# Patient Record
Sex: Male | Born: 1940 | Race: Black or African American | Hispanic: No | Marital: Married | Smoking: Never smoker
Health system: Southern US, Community
[De-identification: ages and names within clinical notes are randomized; demographics above are authoritative.]

## PROBLEM LIST (undated history)

## (undated) DIAGNOSIS — I10 Essential (primary) hypertension: Secondary | ICD-10-CM

## (undated) DIAGNOSIS — C801 Malignant (primary) neoplasm, unspecified: Secondary | ICD-10-CM

## (undated) DIAGNOSIS — M1990 Unspecified osteoarthritis, unspecified site: Secondary | ICD-10-CM

## (undated) DIAGNOSIS — C189 Malignant neoplasm of colon, unspecified: Secondary | ICD-10-CM

## (undated) DIAGNOSIS — T300 Burn of unspecified body region, unspecified degree: Secondary | ICD-10-CM

## (undated) DIAGNOSIS — I219 Acute myocardial infarction, unspecified: Secondary | ICD-10-CM

## (undated) DIAGNOSIS — Z950 Presence of cardiac pacemaker: Secondary | ICD-10-CM

## (undated) DIAGNOSIS — K639 Disease of intestine, unspecified: Secondary | ICD-10-CM

## (undated) DIAGNOSIS — N189 Chronic kidney disease, unspecified: Secondary | ICD-10-CM

## (undated) HISTORY — DX: Disease of intestine, unspecified: K63.9

## (undated) HISTORY — DX: Essential (primary) hypertension: I10

## (undated) HISTORY — PX: OTHER SURGICAL HISTORY: SHX169

## (undated) HISTORY — DX: Malignant neoplasm of colon, unspecified: C18.9

## (undated) HISTORY — PX: ROTATOR CUFF REPAIR: SHX139

## (undated) HISTORY — PX: CYSTOSCOPY: SUR368

## (undated) HISTORY — PX: CERVICAL SPINE SURGERY: SHX589

## (undated) HISTORY — PX: HERNIA REPAIR: SHX51

## (undated) HISTORY — DX: Acute myocardial infarction, unspecified: I21.9

## (undated) HISTORY — DX: Unspecified osteoarthritis, unspecified site: M19.90

---

## 1998-04-17 ENCOUNTER — Emergency Department (HOSPITAL_COMMUNITY): Admission: EM | Admit: 1998-04-17 | Discharge: 1998-04-17 | Payer: Self-pay | Admitting: Emergency Medicine

## 2002-06-21 ENCOUNTER — Ambulatory Visit (HOSPITAL_BASED_OUTPATIENT_CLINIC_OR_DEPARTMENT_OTHER): Admission: RE | Admit: 2002-06-21 | Discharge: 2002-06-21 | Payer: Self-pay | Admitting: Orthopedic Surgery

## 2003-01-27 ENCOUNTER — Emergency Department (HOSPITAL_COMMUNITY): Admission: AC | Admit: 2003-01-27 | Discharge: 2003-01-27 | Payer: Self-pay

## 2003-01-27 ENCOUNTER — Encounter: Payer: Self-pay | Admitting: Emergency Medicine

## 2004-12-19 ENCOUNTER — Ambulatory Visit: Payer: Self-pay | Admitting: Gastroenterology

## 2005-01-07 ENCOUNTER — Ambulatory Visit (HOSPITAL_COMMUNITY): Admission: RE | Admit: 2005-01-07 | Discharge: 2005-01-07 | Payer: Self-pay | Admitting: General Surgery

## 2005-01-07 ENCOUNTER — Ambulatory Visit (HOSPITAL_BASED_OUTPATIENT_CLINIC_OR_DEPARTMENT_OTHER): Admission: RE | Admit: 2005-01-07 | Discharge: 2005-01-07 | Payer: Self-pay | Admitting: General Surgery

## 2005-07-29 HISTORY — PX: COLONOSCOPY: SHX174

## 2005-11-04 ENCOUNTER — Emergency Department (HOSPITAL_COMMUNITY): Admission: EM | Admit: 2005-11-04 | Discharge: 2005-11-04 | Payer: Self-pay | Admitting: Emergency Medicine

## 2006-03-11 ENCOUNTER — Ambulatory Visit: Payer: Self-pay | Admitting: Gastroenterology

## 2006-03-21 ENCOUNTER — Ambulatory Visit: Payer: Self-pay | Admitting: Gastroenterology

## 2008-08-19 ENCOUNTER — Encounter: Admission: RE | Admit: 2008-08-19 | Discharge: 2008-08-19 | Payer: Self-pay | Admitting: Neurosurgery

## 2008-09-02 ENCOUNTER — Ambulatory Visit (HOSPITAL_COMMUNITY): Admission: RE | Admit: 2008-09-02 | Discharge: 2008-09-02 | Payer: Self-pay | Admitting: Neurosurgery

## 2008-09-27 ENCOUNTER — Inpatient Hospital Stay (HOSPITAL_COMMUNITY): Admission: RE | Admit: 2008-09-27 | Discharge: 2008-09-29 | Payer: Self-pay | Admitting: Neurosurgery

## 2009-08-03 ENCOUNTER — Ambulatory Visit (HOSPITAL_COMMUNITY): Admission: RE | Admit: 2009-08-03 | Discharge: 2009-08-03 | Payer: Self-pay | Admitting: General Surgery

## 2010-10-14 LAB — BASIC METABOLIC PANEL
BUN: 14 mg/dL (ref 6–23)
Calcium: 9.2 mg/dL (ref 8.4–10.5)
Creatinine, Ser: 0.8 mg/dL (ref 0.4–1.5)
GFR calc non Af Amer: >60 mL/min (ref >60)
Glucose, Bld: 76 mg/dL (ref 70–99)
Potassium: 4.5 mEq/L (ref 3.5–5.1)

## 2010-10-14 LAB — DIFFERENTIAL
Basophils Absolute: 0 10*3/uL (ref 0.0–0.1)
Eosinophils Absolute: 0.1 10*3/uL (ref 0.0–0.7)
Eosinophils Relative: 2 % (ref 0–5)
Lymphocytes Relative: 32 % (ref 12–46)
Neutrophils Relative %: 54 % (ref 43–77)

## 2010-10-14 LAB — CBC
HCT: 41.9 % (ref 39.0–52.0)
Platelets: 224 10*3/uL (ref 150–400)
RDW: 12.7 % (ref 11.5–15.5)
WBC: 4.7 10*3/uL (ref 4.0–10.5)

## 2010-10-14 LAB — PROTIME-INR
INR: 1.06 (ref 0.00–1.49)
Prothrombin Time: 13.7 seconds (ref 11.6–15.2)

## 2010-11-13 LAB — URINALYSIS, ROUTINE W REFLEX MICROSCOPIC
Bilirubin Urine: NEGATIVE
Hgb urine dipstick: NEGATIVE
Ketones, ur: NEGATIVE mg/dL
Ketones, ur: NEGATIVE mg/dL
Nitrite: NEGATIVE
Protein, ur: NEGATIVE mg/dL
Protein, ur: NEGATIVE mg/dL
Urobilinogen, UA: 1 mg/dL (ref 0.0–1.0)
pH: 5.5 (ref 5.0–8.0)

## 2010-11-13 LAB — CBC
MCHC: 35 g/dL (ref 30.0–36.0)
MCHC: 35.1 g/dL (ref 30.0–36.0)
MCV: 96.1 fL (ref 78.0–100.0)
Platelets: 167 10*3/uL (ref 150–400)
RDW: 13.1 % (ref 11.5–15.5)
RDW: 12.8 % (ref 11.5–15.5)

## 2010-11-13 LAB — PROTIME-INR
INR: 1 (ref 0.00–1.49)
Prothrombin Time: 12.9 seconds (ref 11.6–15.2)

## 2010-11-13 LAB — COMPREHENSIVE METABOLIC PANEL
AST: 19 U/L (ref 0–37)
Albumin: 4.1 g/dL (ref 3.5–5.2)
CO2: 30 mEq/L (ref 19–32)
Calcium: 9.3 mg/dL (ref 8.4–10.5)
Creatinine, Ser: 0.73 mg/dL (ref 0.4–1.5)
GFR calc Af Amer: >60 mL/min (ref >60)
GFR calc non Af Amer: >60 mL/min (ref >60)

## 2010-11-13 LAB — BASIC METABOLIC PANEL
CO2: 32 mEq/L (ref 19–32)
Calcium: 9.1 mg/dL (ref 8.4–10.5)
Creatinine, Ser: 0.75 mg/dL (ref 0.4–1.5)
GFR calc Af Amer: >60 mL/min (ref >60)
GFR calc non Af Amer: >60 mL/min (ref >60)
Glucose, Bld: 91 mg/dL (ref 70–99)

## 2010-11-13 LAB — APTT: aPTT: 33 seconds (ref 24–37)

## 2010-12-04 DIAGNOSIS — I10 Essential (primary) hypertension: Secondary | ICD-10-CM | POA: Insufficient documentation

## 2010-12-05 ENCOUNTER — Ambulatory Visit
Admission: RE | Admit: 2010-12-05 | Discharge: 2010-12-05 | Disposition: A | Discharge status: Home / Self Care | Payer: Medicare Other | Source: Ambulatory Visit | Attending: Cardiology | Admitting: Cardiology

## 2010-12-05 ENCOUNTER — Other Ambulatory Visit: Payer: Self-pay | Admitting: Cardiology

## 2010-12-07 ENCOUNTER — Ambulatory Visit (HOSPITAL_COMMUNITY)
Admission: RE | Admit: 2010-12-07 | Discharge: 2010-12-07 | Disposition: A | Discharge status: Home / Self Care | Payer: Medicare Other | Source: Ambulatory Visit | Attending: Cardiovascular Disease | Admitting: Cardiovascular Disease

## 2010-12-07 DIAGNOSIS — R0789 Other chest pain: Secondary | ICD-10-CM | POA: Insufficient documentation

## 2010-12-11 NOTE — Op Note (Signed)
Jorge Ross, NOU NO.:  [id number]   MEDICAL RECORD NO.:  [id number]          PATIENT TYPE:  INP   LOCATION:  3003                         FACILITY:  [facility]   PHYSICIAN:  Clydene Fake, M.D.  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   POSTOPERATIVE DIAGNOSIS:  Herniated nucleus pulposus, spondylosis with  C5 radiculopathy at C4-5 and spondylitic change at C5-6 and C6-7.   PROCEDURE:  Anterior cervical decompression, diskectomy, and fusion at  C4-5, C5-6, C6-7 with LifeNet allograft bone and Trestle anterior  cervical plate.   SURGEON:  Clydene Fake, MD   ASSISTANT:  Payton Doughty, M.D.   ANESTHESIA:  General endotracheal tube anesthesia.   ESTIMATED BLOOD LOSS:  Minimal.   BLOOD GIVEN:  None.   DRAINS:  None.   COMPLICATIONS:  None.   REASON FOR PROCEDURE:  The patient is a 70 year old gentleman who has  had some neck pain worse on time and also had known spondylitic change  in multiple levels of cervical spine where he started developing more  neck pain, right-sided shoulder and shoulder blade pain.  A new MRI was  done showing the spondylitic changes at C5-6, C6-7 that were seen  previously and a disk herniation on the right side of the C4-5 with the  fragments going up to cephalad and the right side that could irritate  the right side nerve roots and the patient brought in for decompression  and fusion of the 3 levels.   PROCEDURE IN DETAIL:  The patient was brought to the operating room, and  general anesthesia was induced.  The patient was placed in a 10-pound  Halter traction, prepped and draped in the sterile fashion.  Site of  incision injected with 10 mL of 1% lidocaine with epinephrine.  Incision  was then made  from the midline to the anterior border of the  sternocleidomastoid muscle on the left side.  The neck incision was  taken down to the platysma and hemostasis was obtained with Bovie  cauterization.  The platysma muscle was incised and blunt dissection  taken through the anterior cervical fascia at the anterior cervical  spine.  Needle was placed in two interspaces.  X-rays were obtained  showing the needles were up at C5-6 interspace.  Disk space was incised  as the needle was removed and partial diskectomy performed.  Longus  colli muscle was reflected laterally from C4 through C7 bilaterally, and  a self-retaining retractor was placed.  We could see the C4-5 and C5-6  spaces.  We incised the disk spaces at C4-5 and C5-6 and started  diskectomy with pituitary rongeurs and curettes.  Removed anterior  osteophytes with Kerrison punches and placed distraction pins in the C4  and C6 and distracted the interspaces.  Microscope was brought in for  microdissection starting at the C4-5 level.  Diskectomy continued with  the curettes.  The 1- and 2-mm Kerrison punches were used to remove  posterior osteophyte ligament disk and performed bilateral  foraminotomies.  Some free fragments of disk were removed from the right  side using the nerve hook and when we were finished we had good  decompression of the central canal and the bilateral nerve roots.  We  measured height of disk space to be 5 mm, and after removing  cartilaginous endplate with high-speed drill, the 5-mm LifeNet and  allograft bone were tapped into place, countersunk half a millimeters.  We checked posterior to the graft, and there was plenty of room between  bone graft and dura in C5-6, it was certainly spondylitic level.  Anterior osteophytes were removed with Leksell rongeur, and disk space  was drilled with a high-speed drill removing cartilaginous endplate.  We  used 1- and 2-mm Kerrison punches to remove posterior disk  osteophytes  and ligaments to perform bilateral foraminotomies where there appears to  be good central decompression and good decompression of bilateral nerve  roots.  Height of disk space was measured to be 4 mm, and a 4-mm LifeNet  allograft bone was tapped into place.  Distraction pin was removed from  C4 and placed down to C7, and retractors were removed down to C6-7  level.  The disk space was incised with a 15-blade, and partial  discectomy was started with pituitary rongeurs.  We used high-speed  drill to remove anterior osteophytes and cartilaginous endplate going  down towards the posterior cortex.  The 1- and 2-mm Kerrison punches  were used to remove posterior spur disk and ligament.  Decompression of  the central canal and then bilateral foraminotomies were performed.  Height of disk space was noted to be 5 mm left and 5 mm right, and  allograft bone was tapped into place.  Distraction pins were removed.  Hemostasis obtained with Gelfoam and thrombin.  Weight was removed from  the traction.  The bones were firmly in the place, and a Trestle  anterior cervical plate was placed over the anterior cervical spine, and  two screws were placed in the C4, two in the C5, two in the C6, and two  in the C7.  These were all tightened down.  Lateral x-rays were obtained  showing good position of  plates, screws, and bone plug at these levels.  Retractors were removed.  Hemostasis was obtained with Gelfoam and  thrombin, bipolar cauterization.  Gelfoam was irrigated out.  We  irrigated with antibiotic solution.  We had good hemostasis.  The  platysma was closed with 3-0 Vicryl interrupted suture.  Subcutaneous  tissue closed with same.  Skin closed with benzoin and Steri-Strips.  Dressing was placed.  The patient was placed in a soft cervical collar,  awoken from anesthesia, and transferred to the recovery room in stable  condition.           ______________________________  Clydene Fake, M.D.     JRH/MEDQ  D:  09/27/2008  T:  09/28/2008  Job:  [id number]
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
## 2010-12-14 NOTE — Op Note (Signed)
Jorge Ross, Jorge Ross               ACCOUNT NO.:  [id number]   MEDICAL RECORD NO.:  [id number]          PATIENT TYPE:  AMB   LOCATION:  DSC                          FACILITY:  [facility]   PHYSICIAN:  Gabrielle Dare. Janee Morn, M.D.DATE OF BIRTH:  1941-03-20   DATE OF PROCEDURE:  01/07/2005  DATE OF DISCHARGE:                                 OPERATIVE REPORT   PREOPERATIVE DIAGNOSIS:  Right inguinal hernia.   POSTOPERATIVE DIAGNOSIS:  Right inguinal hernia.   PROCEDURE:  Right inguinal hernia repair with mesh.   SURGEON:  Gabrielle Dare. Janee Morn, M.D.   ANESTHESIA:  General.   INDICATIONS FOR PROCEDURE:  The patient is a 70 year old African/American  male whom I evaluated in the office last week for an increasingly  symptomatic right inguinal hernia.  It was small in size, but bothering the  patient with pain and popping in and out.  He presents for an elective  repair.   DESCRIPTION OF PROCEDURE:  An informed consent was obtained.  The patient  was identified.  His hernia sac was marked.  He received intravenous  antibiotics.  He was brought to the operating room and general anesthesia  was administered.  His right groin was prepped and draped in a sterile  fashion.  A right inguinal incision was made.  The subcutaneous tissues were  dissected down, revealing the external oblique fascia.  This was divided  sharply, and the division continued down through the external ring.  The  leaflets of the external oblique were then cleaned inferiorly off of the  cord structures, revealing the shelving edge of the inguinal ligament and  superiorly off of the transversus abdominis.  The ilioinguinal nerve was  identified and protected.  The cord structures were circumferentially  dissected.  A dissection of the cord ensued and this demonstrated a moderate-  sized indirect hernia sac.  This was dissected free from the cord structures  without damaging them.  The sac was opened and the contents were  comprised  by omentum.  This was reduced into the abdomen and then the sac was high-  ligated with a running #2-0 Vicryl suture.  Subsequently the floor was  investigated and intact, with no direct hernia.  The hernia was then  repaired with a key hole-shaped polypropylene mesh.  This was sutured  medially to the tissues over the pubic tubercle with #0 Prolene suture, and  it was sutured in a running fashion along the shelving edge of the inguinal  ligament with a #0 Prolene suture.  Superiorly the mesh was tacked to the  tissues over the pubic tubercle with #0 Vicryl suture in an interrupted  fashion.  Several more interrupted #0 Prolene sutures were used to tack the  mesh down to the transverse abdominis, taking care to protect the  ilioinguinal nerve and keep that away from the suturing, and having it free  overlying the mesh.  The two leaflets of the mesh were re-joined to  reconstruct his ring, and these were sutured together with a #0 Prolene  suture tacked down to the underlying musculature.  The aperture in  the mesh  admitted the tip of the fifth digit, and the cord remained non-edematous.  Some further #0 Prolene suture in an interrupted fashion were used to tack  down the remainder of the mesh, and it laid nicely underneath the external  oblique.  The area was copiously irrigated.  Meticulous hemostasis was  assured.  Some additional local anesthetic with 0.5% Marcaine with  epinephrine was injected.  The external oblique was then closed with a #3-0  Vicryl suture in a running fashion.  The area was again copiously irrigated.  Scarpa's fascia was reapproximated with a series of interrupted #3-0 Vicryl  sutures and the skin was closed with a running #4-0 Monocryl subcuticular  stitch.  Some additional local anesthetic was injected prior to closure of  the skin.  Benzoin, Steri-Strips and a sterile dressing were applied.  The  sponge, needle and instrument counts were correct.    The patient tolerated the procedure well without apparent complications and  was taken to the recovery room in stable condition.       BET/MEDQ  D:  01/07/2005  T:  01/07/2005  Job:  [id number]

## 2010-12-14 NOTE — Op Note (Signed)
NAME:  Jorge Ross, Jorge Ross                         ACCOUNT NO.:  [id number]   MEDICAL RECORD NO.:  [id number]                   PATIENT TYPE:  AMB   LOCATION:  DSC                                  FACILITY:  [facility]   PHYSICIAN:  Harvie Junior, M.D.                DATE OF BIRTH:  1940-10-26   DATE OF PROCEDURE:  06/21/2002  DATE OF DISCHARGE:                                 OPERATIVE REPORT   PREOPERATIVE DIAGNOSES:  1. Ulnar collateral ligament injury, right thumb.  2. Lateral epicondylitis, right elbow.   POSTOPERATIVE DIAGNOSES:  1. Ulnar collateral ligament injury, right thumb.  2. Lateral epicondylitis, right elbow.   OPERATION PERFORMED:  1. Repair of ulnar collateral ligament, right thumb, with a Arthrex mini     suture anchor, bioabsorbable.  2. Injection of lateral epicondylar process for control of chronic pain.   SURGEON:  Harvie Junior, M.D.   ASSISTANT:  Marshia Ly, P. A.   ANESTHESIA:  General.   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  He ultimately was at work and suffered an  injury where he had a complete ulnar collateral ligament injury to the  thumb.  He initially was treated conservatively.  We talked about treatment  options and ultimately did elect to have it fixed and he is brought to the  operating room for this procedure.   DESCRIPTION OF OPERATION:  The patient was brought to the operating room and  after adequate anesthesia was obtained with a general anesthetic the patient  was placed supine on the operating table and the right arm was prepped and  draped in the usual sterile fashion.  Following Esmarch exsanguination of  the extremity the above-placed tourniquet was inflated to 250 mmHg.  Following this a curved incision was made over the metacarpophalangeal joint  through the subcutaneous tissue and  taken down to the level of the extensor  aponeurosis.  The extensor brevis tendon was identified as well as the  adductor tendon.  A Glorious Peach was used to separate the adductor tendon from the  underlying capsule and the adductor was divided.  The adductor was tagged at  this point.  The capsule was then divided from the adductor all the way down  to the inferior portion.  The ulnar collateral ligament was then clearly  identified in the midportion of the metacarpal head.  It had detached from  the proximal phalanx.  The insertion site was identified under fluoroscopic  imaging to make sure that this was the appropriate area and at that point a  drill hole was used in this area of an Arthrex bioabsorbable anchor.  The  anchor was then advanced into place.  Excellent fixation was achieved at  that point.  The suture  was then passed through the ulnar collateral  ligament and it was tied in place, care being taken to overcorrect the joint  slightly and to tie the ligament securely.  Once this was done excellent  stability of the joint was achieved.  The capsule was then closed over this  area.  The adductor aponeurosis was then advanced slightly and closed with 3-  0 Ti-Cron suture; figure-of-eight sutures.  The wound was then copiously  irrigated and suctioned dry.  The skin was then closed with 4-0 nylon  running suture.   Fluoro imaging was then used to assess the stability of the joint.  Excellent stability was achieved and symmetric stability both radially and  ulnarly.   At this point attention was turned to the elbow where injections to the  lateral epicondylar area was undertaken.  Following this a Band-Aid was  placed over this.  The wound had a sterile and compressive dressing applied  as well as a thumb spica splint with the metacarpophalangeal joint held in  slight adduction.   At this point the patient was taken to the recovery room where he was noted  to be in satisfactory  condition.   Estimated blood loss for the procedure was none.                                                   Harvie Junior, M.D.    Ranae Plumber  D:  06/21/2002  T:  06/21/2002  Job:  [id number]

## 2010-12-20 NOTE — Cardiovascular Report (Signed)
  NAMEJOMES, [name] NO.:  [id number]  MEDICAL RECORD NO.:  [id number]           PATIENT TYPE:  O  LOCATION:  6522                         FACILITY:  [facility]  PHYSICIAN:  Nanetta Batty, M.D.   DATE OF BIRTH:  1941/05/22  DATE OF PROCEDURE: DATE OF DISCHARGE:  12/07/2010                           CARDIAC CATHETERIZATION   [name] is a 70 year old married African American male, patient of Dr. Elissa Hefty and Dr. Ophelia Charter with positive risk factors, accelerated angina, EKG changes, referred for outpatient diagnostic coronary arteriography via the right radial approach to define his anatomy and rule out an ischemic etiology.  DESCRIPTION OF PROCEDURE:  The patient was brought to the second floor [facility] in the postabsorptive state.  He was premedicated with p.o. Valium, IV Versed and fentanyl.  His right wrist was prepped and shaved in the usual sterile fashion.  Xylocaine 1% was used for local anesthesia.  A 5-French sheath was inserted into the right radial artery using standard back wall pullback technique.  4000 units heparin was administered intravenously.  Visipaque dye was used for the entirety of the case.  10 mL of radial cocktail was administered via the side-arm sheath.  A 5-French TIG catheter and pigtail catheter were used for selective coronary angiography and left ventriculography respectively.  Retrograde aortic, left ventricular, and pullback pressures were recorded.  HEMODYNAMICS: 1. Aortic systolic pressure 121, diastolic pressure 66. 2. Left ventricular systolic pressure 125 and diastolic pressure 29. 3. Selective coronary cholangiography:     a.     Left main normal.     b.     LAD normal.     c.     Left circumflex normal.     d.     Ramus branch normal.     e.     Right coronary artery was dominant, normal.     f.     Left ventriculography; RAO left ventriculogram was performed      using 25 mL of Visipaque dye at 12  mL per second.  The overall      LVEF was estimated at greater than 60% without focal wall motion      abnormalities.  IMPRESSION:  [name] has normal coronary arteries, normal left ventricular function.  I believe his chest pain is noncardiac.  The guidewire and catheter removed.  The sheath was removed and a TR band was placed on the right wrist to achieve patent hemostasis.  The patient left the lab in stable condition.  He will be discharged home later today as an outpatient on an antireflux medication and will follow up with Dr. Bryan Lemma in 1-2 weeks.     Nanetta Batty, M.D.     JB/MEDQ  D:  12/07/2010  T:  12/08/2010  Job:  [id number]  cc:   Cardiac Catheterization Laboratory [facility] Landry Corporal, MD Margaretmary Bayley, M.D.  Electronically Signed by Nanetta Batty M.D. on 12/20/2010 03:42:38 PM

## 2013-07-29 DIAGNOSIS — C189 Malignant neoplasm of colon, unspecified: Secondary | ICD-10-CM

## 2013-07-29 DIAGNOSIS — C801 Malignant (primary) neoplasm, unspecified: Secondary | ICD-10-CM

## 2013-07-29 HISTORY — DX: Malignant (primary) neoplasm, unspecified: C80.1

## 2013-07-29 HISTORY — DX: Malignant neoplasm of colon, unspecified: C18.9

## 2014-01-31 ENCOUNTER — Encounter: Payer: Self-pay | Admitting: Gastroenterology

## 2014-02-10 ENCOUNTER — Ambulatory Visit (INDEPENDENT_AMBULATORY_CARE_PROVIDER_SITE_OTHER): Payer: Medicare Other | Admitting: Nurse Practitioner

## 2014-02-10 ENCOUNTER — Encounter: Payer: Self-pay | Admitting: Nurse Practitioner

## 2014-02-10 VITALS — BP 110/60 | HR 78 | Ht 72.0 in | Wt 181.8 lb

## 2014-02-10 DIAGNOSIS — R195 Other fecal abnormalities: Secondary | ICD-10-CM | POA: Insufficient documentation

## 2014-02-10 MED ORDER — NA SULFATE-K SULFATE-MG SULF 17.5-3.13-1.6 GM/177ML PO SOLN
ORAL | Status: DC
Start: 2014-02-10 — End: 2014-02-15

## 2014-02-10 NOTE — Patient Instructions (Signed)
You have been scheduled for a colonoscopy with Dr. Kaplan. Please follow written instructions given to you at your visit today.  Please pick up your prep kit at the pharmacy within the next 1-3 days. If you use inhalers (even only as needed), please bring them with you on the day of your procedure. Your physician has requested that you go to www.startemmi.com and enter the access code given to you at your visit today. This web site gives a general overview about your procedure. However, you should still follow specific instructions given to you by our office regarding your preparation for the procedure.  

## 2014-02-10 NOTE — Progress Notes (Signed)
    HPI :  Patient is a 73 year old male referred by PCP (Dr. Jaci Standard) for evaluation of occult blood in stool. He is known remotely to Dr. Deatra Ina for screening colonoscopy August 2007. Exam was normal. Patient is in relatively good health other than hypertension. Patient denies bowel changes, abdominal pain, nausea or vomiting. He has not seen any blood in his stool. I do not have the results but it sounds like patient had immunochemical instead of guaiac fecal occult blood testing which is more specific to colonic blood loss.   Past Medical History  Diagnosis Date  . Hypertension     Family History  Problem Relation Age of Onset  . Colon cancer Neg Hx   . Rectal cancer Neg Hx   . Esophageal cancer Neg Hx   . Prostate cancer Paternal Uncle   . Lung cancer Paternal Uncle     smoker   History  Substance Use Topics  . Smoking status: Never Smoker   . Smokeless tobacco: Never Used  . Alcohol Use: Yes     Comment: occ.   Current Outpatient Prescriptions  Medication Sig Dispense Refill  . aspirin 81 MG tablet Take 81 mg by mouth daily.        Marland Kitchen losartan (COZAAR) 50 MG tablet Take 50 mg by mouth daily.        . Multiple Vitamin (MULTIVITAMIN) capsule Take 1 capsule by mouth daily.        . Naproxen Sodium (ALEVE) 220 MG CAPS Take by mouth as needed.        . vitamin E 400 UNIT capsule Take 800 Units by mouth daily.       No current facility-administered medications for this visit.   Allergies  Allergen Reactions  . Codeine     constipation    Review of Systems: All systems reviewed and negative except where noted in HPI.   Physical Exam: BP 110/60  Pulse 78  Ht 6' (1.829 m)  Wt 181 lb 12.8 oz (82.464 kg)  BMI 24.65 kg/m2 Constitutional: Pleasant,well-developed, black male in no acute distress. HEENT: Normocephalic and atraumatic. Conjunctivae are normal. No scleral icterus. Neck supple.  Cardiovascular: Normal rate, regular rhythm.  Pulmonary/chest: Effort normal and  breath sounds normal. No wheezing, rales or rhonchi. Abdominal: Soft, nondistended, nontender. Bowel sounds active throughout. There are no masses palpable. No hepatomegaly. Extremities: no edema Lymphadenopathy: No cervical adenopathy noted. Neurological: Alert and oriented to person place and time. Skin: Skin is warm and dry. No rashes noted. Psychiatric: Normal mood and affect. Behavior is normal.   ASSESSMENT AND PLAN:  73 year old male with occult blood in stool . CBC mid June was normal with hemoglobin of 14 and MCV of 94 see Matt was normal TSH was low. For further evaluation of cold blood in stool patient will be scheduled for a colonoscopy. The risks, benefits, and alternatives to colonoscopy with possible biopsy and possible polypectomy were discussed with the patient and he consents to proceed.

## 2014-02-11 ENCOUNTER — Encounter: Payer: Self-pay | Admitting: Gastroenterology

## 2014-02-11 NOTE — Progress Notes (Signed)
Reviewed and agree with management. Yedidya Duddy D. Ludie Hudon, M.D., FACG  

## 2014-02-14 ENCOUNTER — Telehealth: Payer: Self-pay | Admitting: Gastroenterology

## 2014-02-14 NOTE — Telephone Encounter (Signed)
Returned patient's call and he stated that the prep was too expensive.  He states that it was going to be $40.00 dollars and he said it was too much.  I called Alfredia Client, RN in pre-visit to see if she had a sample for the patient.  She did and brought it to me and documented it in the sample log.  I told the patient that he can come by the 4th floor at the front desk and pick it up.  He stated that he would come by within the hour.

## 2014-02-15 ENCOUNTER — Ambulatory Visit (AMBULATORY_SURGERY_CENTER): Payer: Medicare Other | Admitting: Gastroenterology

## 2014-02-15 ENCOUNTER — Encounter: Payer: Self-pay | Admitting: Gastroenterology

## 2014-02-15 ENCOUNTER — Other Ambulatory Visit: Payer: Self-pay

## 2014-02-15 ENCOUNTER — Other Ambulatory Visit (INDEPENDENT_AMBULATORY_CARE_PROVIDER_SITE_OTHER): Payer: Medicare Other

## 2014-02-15 VITALS — BP 141/85 | HR 53 | Temp 97.6°F | Resp 12 | Ht 72.0 in | Wt 181.0 lb

## 2014-02-15 DIAGNOSIS — K6389 Other specified diseases of intestine: Secondary | ICD-10-CM

## 2014-02-15 DIAGNOSIS — R195 Other fecal abnormalities: Secondary | ICD-10-CM

## 2014-02-15 DIAGNOSIS — D126 Benign neoplasm of colon, unspecified: Secondary | ICD-10-CM

## 2014-02-15 DIAGNOSIS — K639 Disease of intestine, unspecified: Secondary | ICD-10-CM

## 2014-02-15 LAB — COMPREHENSIVE METABOLIC PANEL
ALBUMIN: 3.9 g/dL (ref 3.5–5.2)
ALT: 21 U/L (ref 0–53)
AST: 24 U/L (ref 0–37)
Alkaline Phosphatase: 55 U/L (ref 39–117)
BILIRUBIN TOTAL: 1 mg/dL (ref 0.2–1.2)
BUN: 12 mg/dL (ref 6–23)
CALCIUM: 9.2 mg/dL (ref 8.4–10.5)
CHLORIDE: 102 meq/L (ref 96–112)
CO2: 30 meq/L (ref 19–32)
Creatinine, Ser: 0.9 mg/dL (ref 0.4–1.5)
GFR: 104.17 mL/min (ref >60.00)
GLUCOSE: 80 mg/dL (ref 70–99)
POTASSIUM: 3.9 meq/L (ref 3.5–5.1)
Sodium: 138 mEq/L (ref 135–145)
TOTAL PROTEIN: 6.7 g/dL (ref 6.0–8.3)

## 2014-02-15 LAB — CBC WITH DIFFERENTIAL/PLATELET
BASOS ABS: 0 10*3/uL (ref 0.0–0.1)
Basophils Relative: 0.4 % (ref 0.0–3.0)
EOS PCT: 1.2 % (ref 0.0–5.0)
Eosinophils Absolute: 0 10*3/uL (ref 0.0–0.7)
HEMATOCRIT: 41.2 % (ref 39.0–52.0)
Hemoglobin: 13.9 g/dL (ref 13.0–17.0)
Lymphocytes Relative: 36.9 % (ref 12.0–46.0)
Lymphs Abs: 1.3 10*3/uL (ref 0.7–4.0)
MCHC: 33.7 g/dL (ref 30.0–36.0)
MCV: 98.1 fl (ref 78.0–100.0)
MONOS PCT: 11.9 % (ref 3.0–12.0)
Monocytes Absolute: 0.4 10*3/uL (ref 0.1–1.0)
NEUTROS PCT: 49.6 % (ref 43.0–77.0)
Neutro Abs: 1.8 10*3/uL (ref 1.4–7.7)
Platelets: 160 10*3/uL (ref 150.0–400.0)
RBC: 4.2 Mil/uL — ABNORMAL LOW (ref 4.22–5.81)
RDW: 13.1 % (ref 11.5–15.5)
WBC: 3.5 10*3/uL — AB (ref 4.0–10.5)

## 2014-02-15 MED ORDER — SODIUM CHLORIDE 0.9 % IV SOLN: 500.0000 mL | INTRAVENOUS | Status: DC

## 2014-02-15 NOTE — Progress Notes (Signed)
Called to room to assist during endoscopic procedure.  Patient ID and intended procedure confirmed with present staff. Received instructions for my participation in the procedure from the performing physician.  

## 2014-02-15 NOTE — Op Note (Signed)
[facility]  [address] [place], [address]   COLONOSCOPY PROCEDURE REPORT  PATIENT: Jorge Ross, Jorge Ross  MR#: [id number] BIRTHDATE: 02/17/1941 , 73  yrs. old GENDER: Male ENDOSCOPIST: Inda Castle, MD REFERRED TK:ZSWFU, [place] PROCEDURE DATE:  02/15/2014 PROCEDURE:   Colonoscopy with snare polypectomy and Colonoscopy with biopsy First Screening Colonoscopy - Avg.  risk and is 50 yrs.  old or older - No.  Prior Negative Screening - Now for repeat screening. Other: See Comments  History of Adenoma - Now for follow-up colonoscopy & has been > or = to 3 yrs.  N/A  Polyps Removed Today? Yes. ASA CLASS:   Class II INDICATIONS:occult blood . MEDICATIONS: MAC sedation, administered by CRNA and Propofol (Diprivan) 240 mg IV  DESCRIPTION OF PROCEDURE:   After the risks benefits and alternatives of the procedure were thoroughly explained, informed consent was obtained.  A digital rectal exam revealed no abnormalities of the rectum.   The LB [id number]  endoscope was introduced through the anus and advanced to the cecum, which was identified by both the appendix and ileocecal valve. No adverse events experienced.   The quality of the prep was excellent using Suprep  The instrument was then slowly withdrawn as the colon was fully examined.      COLON FINDINGS: A one-third circumferential polypoid shaped mass was found at the cecum.  Multiple biopsies were performed.   A sessile polyp measuring 4 mm in size was found in the distal transverse colon.  A polypectomy was performed with a cold snare.  The resection was complete and the polyp tissue was completely retrieved.   The colon mucosa was otherwise normal.  Retroflexed views revealed no abnormalities. The time to cecum=4 minutes 47 seconds.  Withdrawal time=8 minutes 50 seconds.  The scope was withdrawn and the procedure completed. COMPLICATIONS: There were no complications.  ENDOSCOPIC IMPRESSION: 1.    One-third circumferential mass was found at the cecum; multiple biopsies were performed 2.   Sessile polyp measuring 4 mm in size was found in the distal transverse colon; polypectomy was performed with a cold snare 3.   The colon mucosa was otherwise normal  RECOMMENDATIONS: CBC, CEA, comprehensive metabolic profile CT of the abdomen chest and pelvis Surgical referral  eSigned:  Inda Castle, MD 02/15/2014 10:26 AM   cc: Jeanann Lewandowsky MD , Stark Klein, MD   PATIENT NAME:  Jorge Ross, Jorge Ross MR#: [id number]

## 2014-02-15 NOTE — Progress Notes (Signed)
BLOODWORK DONE ON DISCHARGE. INSTRUCTIONS FOR CT SCAN.

## 2014-02-15 NOTE — Patient Instructions (Signed)
YOU HAD AN ENDOSCOPIC PROCEDURE TODAY AT THE [facility]: Refer to the procedure report that was given to you for any specific questions about what was found during the examination.  If the procedure report does not answer your questions, please call your gastroenterologist to clarify.  If you requested that your care partner not be given the details of your procedure findings, then the procedure report has been included in a sealed envelope for you to review at your convenience later.  YOU SHOULD EXPECT: Some feelings of bloating in the abdomen. Passage of more gas than usual.  Walking can help get rid of the air that was put into your GI tract during the procedure and reduce the bloating. If you had a lower endoscopy (such as a colonoscopy or flexible sigmoidoscopy) you may notice spotting of blood in your stool or on the toilet paper. If you underwent a bowel prep for your procedure, then you may not have a normal bowel movement for a few days.  DIET: Your first meal following the procedure should be a light meal and then it is ok to progress to your normal diet.  A half-sandwich or bowl of soup is an example of a good first meal.  Heavy or fried foods are harder to digest and may make you feel nauseous or bloated.  Likewise meals heavy in dairy and vegetables can cause extra gas to form and this can also increase the bloating.  Drink plenty of fluids but you should avoid alcoholic beverages for 24 hours.  ACTIVITY: Your care partner should take you home directly after the procedure.  You should plan to take it easy, moving slowly for the rest of the day.  You can resume normal activity the day after the procedure however you should NOT DRIVE or use heavy machinery for 24 hours (because of the sedation medicines used during the test).    SYMPTOMS TO REPORT IMMEDIATELY: A gastroenterologist can be reached at any hour.  During normal business hours, 8:30 AM to 5:00 PM Monday through Friday,  call [phone].  After hours and on weekends, please call the GI answering service at [phone] who will take a message and have the physician on call contact you.   Following lower endoscopy (colonoscopy or flexible sigmoidoscopy):  Excessive amounts of blood in the stool  Significant tenderness or worsening of abdominal pains  Swelling of the abdomen that is new, acute  Fever of 100F or higher    FOLLOW UP: If any biopsies were taken you will be contacted by phone or by letter within the next 1-3 weeks.  Call your gastroenterologist if you have not heard about the biopsies in 3 weeks.  Our staff will call the home number listed on your records the next business day following your procedure to check on you and address any questions or concerns that you may have at that time regarding the information given to you following your procedure. This is a courtesy call and so if there is no answer at the home number and we have not heard from you through the emergency physician on call, we will assume that you have returned to your regular daily activities without incident.  SIGNATURES/CONFIDENTIALITY: You and/or your care partner have signed paperwork which will be entered into your electronic medical record.  These signatures attest to the fact that that the information above on your After Visit Summary has been reviewed and is understood.  Full responsibility of the confidentiality   of this discharge information lies with you and/or your care-partner.     

## 2014-02-15 NOTE — Progress Notes (Signed)
A/ox3, pleased with MAC, report to RN 

## 2014-02-16 ENCOUNTER — Encounter (INDEPENDENT_AMBULATORY_CARE_PROVIDER_SITE_OTHER): Payer: Self-pay | Admitting: General Surgery

## 2014-02-16 ENCOUNTER — Telehealth: Payer: Self-pay

## 2014-02-16 ENCOUNTER — Ambulatory Visit (INDEPENDENT_AMBULATORY_CARE_PROVIDER_SITE_OTHER): Payer: Medicare Other | Admitting: General Surgery

## 2014-02-16 VITALS — BP 133/81 | HR 70 | Temp 98.1°F | Resp 16 | Ht 72.0 in | Wt 180.0 lb

## 2014-02-16 DIAGNOSIS — D126 Benign neoplasm of colon, unspecified: Secondary | ICD-10-CM | POA: Insufficient documentation

## 2014-02-16 DIAGNOSIS — K6389 Other specified diseases of intestine: Secondary | ICD-10-CM

## 2014-02-16 LAB — CEA: CEA: 1.1 ng/mL (ref 0.0–5.0)

## 2014-02-16 NOTE — Progress Notes (Signed)
Chief Complaint  Patient presents with  . Colon Cancer    HISTORY: Patient is referred by Dr. Erskine Emery for a new diagnosis of a cecal mass. The patient had a colonoscopy approximately 8 years ago that was clean. He presented with occult blood in stool. He was referred to GI for a repeat colonoscopy. A few small polyps were found but there is a large one third circumference mass seen in the cecum. This biopsy. The biopsy was done yesterday.  The patient denies any nausea, vomiting, change in bowel habits. The patient has had around in 8-10 pound weight loss. However, he has been working outside and thinks that when he does that that he sweats a lot and doesn't eat very much. He denies any visible blood in the stools. He does have a sister that had breast cancer. He also had a brother that had an unknown type of cancer. He does not smoke or use any illicit drugs. He drinks alcohol occasionally.  Past Medical History  Diagnosis Date  . Hypertension   . Arthritis     HIPS    Past Surgical History  Procedure Laterality Date  . Rotator cuff repair Left   . Hernia repair Bilateral   . Cervical spine surgery      3 titamum screws  . Colonoscopy  2007    normal     Current Outpatient Prescriptions  Medication Sig Dispense Refill  . aspirin 81 MG tablet Take 81 mg by mouth daily.        . irbesartan-hydrochlorothiazide (AVALIDE) 150-12.5 MG per tablet       . Multiple Vitamin (MULTIVITAMIN) capsule Take 1 capsule by mouth daily.        . Naproxen Sodium (ALEVE) 220 MG CAPS Take by mouth as needed.        . vitamin E 400 UNIT capsule Take 800 Units by mouth daily.       No current facility-administered medications for this visit.     Allergies  Allergen Reactions  . Codeine     constipation     Family History  Problem Relation Age of Onset  . Colon cancer Neg Hx   . Rectal cancer Neg Hx   . Esophageal cancer Neg Hx   . Prostate cancer Paternal Uncle   . Lung cancer  Paternal Uncle     smoker     History   Social History  . Marital Status: Married    Spouse Name: N/A    Number of Children: 1  . Years of Education: N/A   Occupational History  . 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 Smoking status: Never Smoker   . Smokeless tobacco: Never Used  . Alcohol Use: Yes     Comment: occ.  . Drug Use: No  . Sexual Activity: None   Other Topics Concern  . None   Social History Narrative  . None     REVIEW OF SYSTEMS - PERTINENT POSITIVES ONLY: 12 point review of systems negative other than HPI and PMH except for nasal congestion, leg swelling, blood in stool, joint pain, rash.    EXAM: Filed Vitals:   02/16/14 1633  BP: 133/81  Pulse: 70  Temp: 98.1 F (36.7 C)  Resp: 16    Wt Readings from Last 3 Encounters:  02/16/14 180 lb (81.647 kg)  02/15/14 181 lb (82.101 kg)  02/10/14 181 lb 12.8 oz (82.464 kg)  Gen:  No acute distress.  Well nourished and well groomed.   Neurological: Alert and oriented to person, place, and time. Coordination normal.  Head: Normocephalic and atraumatic.  Eyes: Conjunctivae are normal. Pupils are equal, round, and reactive to light. No scleral icterus.  Neck: Normal range of motion. Neck supple. No tracheal deviation or thyromegaly present.  Cardiovascular: Normal rate, regular rhythm, normal heart sounds and intact distal pulses.  Exam reveals no gallop and no friction rub.  No murmur heard. Respiratory: Effort normal.  No respiratory distress. No chest wall tenderness. Breath sounds normal.  No wheezes, rales or rhonchi.  GI: Soft. Bowel sounds are normal. The abdomen is soft and nontender.  There is no rebound and no guarding.  Musculoskeletal: Normal range of motion. Extremities are nontender.  Lymphadenopathy: No cervical, preauricular, postauricular or axillary adenopathy is present Skin: Skin is warm and dry. No rash noted. No diaphoresis. No erythema.  No pallor. No clubbing, cyanosis, or edema.   Psychiatric: Normal mood and affect. Behavior is normal. Judgment and thought content normal.    LABORATORY RESULTS: Available labs are reviewed   Recent Results (from the past 2160 hour(s))  CBC WITH DIFFERENTIAL     Status: Abnormal   Collection Time    02/15/14 11:21 AM      Result Value Ref Range   WBC 3.5 (*) 4.0 - 10.5 K/uL   RBC 4.20 (*) 4.22 - 5.81 Mil/uL   Hemoglobin 13.9  13.0 - 17.0 g/dL   HCT 41.2  39.0 - 52.0 %   MCV 98.1  78.0 - 100.0 fl   MCHC 33.7  30.0 - 36.0 g/dL   RDW 13.1  11.5 - 15.5 %   Platelets 160.0  150.0 - 400.0 K/uL   Neutrophils Relative % 49.6  43.0 - 77.0 %   Lymphocytes Relative 36.9  12.0 - 46.0 %   Monocytes Relative 11.9  3.0 - 12.0 %   Eosinophils Relative 1.2  0.0 - 5.0 %   Basophils Relative 0.4  0.0 - 3.0 %   Neutro Abs 1.8  1.4 - 7.7 K/uL   Lymphs Abs 1.3  0.7 - 4.0 K/uL   Monocytes Absolute 0.4  0.1 - 1.0 K/uL   Eosinophils Absolute 0.0  0.0 - 0.7 K/uL   Basophils Absolute 0.0  0.0 - 0.1 K/uL  COMPREHENSIVE METABOLIC PANEL     Status: None   Collection Time    02/15/14 11:21 AM      Result Value Ref Range   Sodium 138  135 - 145 mEq/L   Potassium 3.9  3.5 - 5.1 mEq/L   Chloride 102  96 - 112 mEq/L   CO2 30  19 - 32 mEq/L   Glucose, Bld 80  70 - 99 mg/dL   BUN 12  6 - 23 mg/dL   Creatinine, Ser 0.9  0.4 - 1.5 mg/dL   Total Bilirubin 1.0  0.2 - 1.2 mg/dL   Alkaline Phosphatase 55  39 - 117 U/L   AST 24  0 - 37 U/L   ALT 21  0 - 53 U/L   Total Protein 6.7  6.0 - 8.3 g/dL   Albumin 3.9  3.5 - 5.2 g/dL   Calcium 9.2  8.4 - 10.5 mg/dL   GFR 104.17  >60.00 mL/min  CEA     Status: None   Collection Time    02/15/14 11:21 AM      Result Value Ref Range   CEA 1.1  0.0 - 5.0 ng/mL     RADIOLOGY RESULTS: See E-Chart or I-Site for most recent results.  Images and reports are reviewed.  No results found.    ASSESSMENT AND PLAN: Colonic mass (pathology pending) The patient likely  has a colon cancer.  Pathology should be back in the next 24-48 hours. He has a CT scan scheduled tomorrow.  Either way, he has an unresectable polyp in the cecum. He'll need a laparoscopic ileocolectomy. I described the rationale for taking this amount of colon. I discussed the issue of sampling error of the biopsies and the polyp. I discussed lymphatic drainage. I reviewed the surgical procedure and the possible risks. We discussed bleeding, infection, hernia, heart or lung problems, blood clots, anastomotic leak, possible ostomy. I discussed the postoperative recovery including a usual 3-7 day hospital stay. I also discussed the lifting restrictions for 6-8 weeks. He would like to try to get this done in mid August so that he can go on vacation next week and be better enough in time for [employer] football season. 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     Milus Height MD [facility]      Visit Diagnoses: 1. Colonic mass (pathology pending)     Primary Care Physician: [name], MD [facility]

## 2014-02-16 NOTE — Patient Instructions (Signed)
Partial Removal of the colon Your medical providers are recommending removing part of your colon for polyp/cancer.  Based on the location and type of colon problem you have, the following will help describe what happens when you have this surgery.  TREATMENT When we take out part of the colon, we take an apron of tissue with lymph nodes and blood vessels.  We usually use laparoscopy (smaller incisions) to help minimize the size of the larger incision on your abdomen.  However, even if we use laparoscopy primarily, we still need to make an incision large enough to bring the two ends of the colon out to reconnect them.  For chronic inflammation such as we see in diverticulitis or for tumors that are very large or very adherent to the adjacent structures, we may have to make a larger incision to do your operation safely.    When a section of the colon or rectum is removed, we can usually reconnect the healthy parts. However, sometimes reconnection is not possible. In this case, we will make a new path for your bowel movements to leave the body. This is an opening (a stoma) in the wall of the abdomen. The upper end of the intestine is then connected to the stoma. The other end is closed. The operation to create the stoma is called a colostomy. A flat bag fits over the stoma to collect waste, and a special adhesive holds it in place.  Most patients are able to do their normal activities with a colostomy, including travelling, exercising, and swimming.    Some colostomies are temporary. The colostomy is needed only until the colon or rectum heals from surgery. Some patients will need to get additional treatment such as chemotherapy (if they are cancer patients) before the ostomy can be taken down.  After this occurs, we can reconnect the parts of the intestine and closes the stoma.  Some  patients need a permanent colostomy, and some patients are too frail to undergo another surgery to take the ostomy down.     The part of colon that we plan to remove in your case is the cecum.  Whatever the operative findings, I will discuss the case with your family after we are done in the operating room.  I will talk to you in the next few days when you are more awake.  I will see you in the hospital every weekday that I am not out of town.  My partners help see patients on the weekends and if I am out of town.     IF YOU ARE TAKING ASPIRIN, PLAVIX, COUMADIN, OR OTHER BLOOD THINNERS, LET us KNOW IMMEDIATELY SO WE CAN CONTACT YOUR PRESCRIBING HEALTH CARE PROVIDER TO HOLD THE MEDICATION FOR 5-7 DAYS BEFORE SURGERY    WHAT HAPPENS AFTER SURGERY: After surgery, you will go first to the recovery room, then to your room.   You will have many tubes and lines in place which is standard for this operation.  You will have several IVs, including possibly a central line in your chest or neck.  YOU WILL NOT BE ABLE TO EAT FOR SEVERAL DAYS AFTER SURGERY.  You will have a catheter in your bladder for around 1-3 days.  On your abdomen, you will have and possibly a pain pump with numbing medicine.  You will have compression stockings on your legs to decrease the risk of blood clots.    We will address your pain in several ways.  We will use  an IV pain pump called a "PCA," or Patient Controlled Analgesia.  This allows you to press a button and immediately receive a dose of pain medication without waiting for a nurse.  We also use IV Tylenol and sometimes IV Toradol which is similar to ibuprofen.  You may also have a pump with numbing medicine delivered directly to your incision.  I use doses and medications that work for the majority of people, but you may need an adjustment to the dose or type of medicine if your pain is not adequately controlled.  Your throat may be sore, in which case you may need a throat spray or lozenges.  Some patients become disoriented with the pain medication and may need adjustments due to that.    We will  ask you to get out of bed the day after surgery in order to maximize your chances of not having complications.  Your risk of pneumonia and blood clots is lower with walking and sitting in the chair.  We will also ask you to perform breathing exercises.  We will also ask to you walk in your room and in the halls for the above reasons, but also in order for you to keep up your strength.    EATING: We will usually start you on clear liquids in around 1-2 days if your bowel function seems to be returning.  We advance your diet slowly to make sure you are tolerating each step.  All patients do not have a normal appetite when they go home.  Many patients also find that their taste buds do not seem the same right after surgery, and this can continue into the time of possible post operative chemotherapy and radiation.  We may need to use different doses or type of medicine than you use at home to control high blood pressure, diabetes, or other medical problems.   SLEEPING: We do not give sleeping medications in the hospital routinely.  Many patients have difficulty sleeping due to the unfamiliar environment or the medical care that occurs at night.  Sleeping medications can interfere with the pain medications and cause significant mental status changes that are dangerous.    GOING HOME! Usually you are able to go home in 4-8 days, depending on whether or not complications happen and what is going on with your overall health status.   If you have more health problems or if you have limited help at home, the therapists and nurses may recommend a temporary rehab or nursing facility to help you get back on your feet before you go home.  These decisions would be made while you are in the hospital with the assistance of a social worker or case manager.  You cannot do any heavy lifting for 6-8 weeks due to the risk of hernia.    Please bring all insurance/disability forms to our office for the staff to fill  out   POSSIBLE COMPLICATIONS This is a major operation and includes complications listed below: Bleeding Infection and possible wound complications such as hernia Damage to adjacent structures Leak of surgical connections, which can lead to other surgeries and possibly an ostomy. (5-10%) Possible need for other procedures, such as abscess drains in radiology  Possible prolonged hospital stay Possible diarrhea from removal of part of the colon. Possible constipation from narcotics.    Prolonged fatigue/weakness/appetite MOST PATIENTS' ENERGY LEVEL IS NOT BACK TO NORMAL FOR AT LEAST 2-4 MONTHS.  OLDER PATIENTS MAY FEEL WEAK FOR LONGER PERIODS OF TIME.  Difficulty with eating or post operative nausea  Possible cancer found at surgery Possible complications of your medical problems such as heart disease or arrhythmias. Death (less than 1%)  All possible complications are not listed, just the most common.    FURTHER INFORMATION? Please ask questions if you find something that we did not discuss in the office and would like more information.  If you would like another appointment if you have many questions or if your family members would like to come as well, please contact the office.    FOLLOW-UP CARE  Follow-up care after treatment for colorectal cancer is important. Even when the cancer seems to have been completely removed or destroyed, the disease sometimes returns. Undetected cancer cells may still remain somewhere in the body after treatment. We check your recovery and for recurrence of the cancer. Recurrence means that the cancer comes back.  Checkups help make sure that changes in health are found. Checkups may include:  A physical exam (possibly including a digital rectal exam). This means your caregiver checks you to see if there are any abnormal changes they can see or feel.   Lab tests (CEA test) may be done. The "fecal occult blood test" checks for blood in the stool. The CEA  (carcinoembryonic antigen) is a blood test that looks for a marker of colon cancer in the blood.   A colonoscopy is a test where your caregiver examines your colon with a flexible instrument like a thin telescope which looks at the inside of the large bowel. All patients with colon cancer are recommended to get a colonoscopy 1 year after the surgery  Other specialized x-rays, CT scans, or other tests may be performed.    Between scheduled visits you should contact your caregivers as soon as any health problems appear.

## 2014-02-16 NOTE — Telephone Encounter (Signed)
Pt aware of appt.

## 2014-02-16 NOTE — Telephone Encounter (Signed)
Message copied by Algernon Huxley on Wed Feb 16, 2014 10:50 AM ------      Message from: Aviva Signs      Created: Wed Feb 16, 2014 10:37 AM      Regarding: RE: Merdis Delay.. July 27 arrive at 1.45 for a 2.10pm apt with Dr Marcello Moores.Marland KitchenMarland KitchenThayer Headings      ----- Message -----         From: Maury Dus, RN         Sent: 02/16/2014   9:50 AM           To: Aviva Signs      Subject: RE: Ruby Cola,            I apologize!! I called the pt this am and he has decided he does not want to go on vacation, states he would like to come on Monday 02/21/14 if the appt is still available.            Please let me know.      Thanks,      Vaughan Basta            ----- Message -----         From: Aviva Signs         Sent: 02/16/2014   9:39 AM           To: Maury Dus, RN      Subject: RE: Binnie Rail is rescheduled for Aug 5th arrive at 1.45 for a 2.10 apt with Dr Marcello Moores      ----- Message -----         From: Maury Dus, RN         Sent: 02/15/2014   4:15 PM           To: Aviva Signs      Subject: RE: Jocelyn Lamer,            I just called the pt regarding this appt and he is going on vacation 7/24 and will return 8/3. Please let me know when he can be seen after this...........sorry.            Thanks,      Rosanne Sack RN      ----- Message -----         From: Aviva Signs         Sent: 02/15/2014   4:03 PM           To: Maury Dus, RN      Subject: RE: Ok Edwards  Deisy Ozbun,I have pt scheduled with Dr Leighton Ruff on Monday the 27th arrive at 1.45pm for a 2.10pm apt.Marland KitchenMarland KitchenHope this works.Marland KitchenMarland KitchenThayer Headings      ----- Message -----         From: Maury Dus, RN         Sent: 02/15/2014   2:07 PM           To: Aviva Signs      Subject: Jocelyn Lamer,            Dr.  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 Please let me know when and who they will be seen by.            Thanks,      Rosanne Sack RN                               ------

## 2014-02-16 NOTE — Telephone Encounter (Signed)
  Follow up Call-  Call back number 02/15/2014  Post procedure Call Back phone  # [phone]  Permission to leave phone message Yes     Patient questions:  Do you have a fever, pain , or abdominal swelling? No. Pain Score  0 *  Have you tolerated food without any problems? Yes.    Have you been able to return to your normal activities? Yes.    Do you have any questions about your discharge instructions: Diet   No. Medications  No. Follow up visit  No.  Do you have questions or concerns about your Care? No.  Actions: * If pain score is 4 or above: No action needed, pain <4.

## 2014-02-16 NOTE — Assessment & Plan Note (Signed)
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  Pathology should be back in the next 24-48 hours. He has a CT scan scheduled tomorrow.  Either way, he has an unresectable polyp in the cecum. He'll need a laparoscopic ileocolectomy. I described the rationale for taking this amount of colon. I discussed the issue of sampling error of the biopsies and the polyp. I discussed lymphatic drainage. I reviewed the surgical procedure and the possible risks. We discussed bleeding, infection, hernia, heart or lung problems, blood clots, anastomotic leak, possible ostomy. I discussed the postoperative recovery including a usual 3-7 day hospital stay. I also discussed the lifting restrictions for 6-8 weeks. He would like to try to get this done in mid August so that he can go on vacation next week and be better enough in time for [employer] football season. 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

## 2014-02-17 ENCOUNTER — Ambulatory Visit (INDEPENDENT_AMBULATORY_CARE_PROVIDER_SITE_OTHER)
Admission: RE | Admit: 2014-02-17 | Discharge: 2014-02-17 | Disposition: A | Discharge status: Home / Self Care | Payer: Medicare Other | Source: Ambulatory Visit | Attending: Gastroenterology | Admitting: Gastroenterology

## 2014-02-17 DIAGNOSIS — K639 Disease of intestine, unspecified: Secondary | ICD-10-CM

## 2014-02-17 DIAGNOSIS — K6389 Other specified diseases of intestine: Secondary | ICD-10-CM

## 2014-02-17 MED ORDER — IOHEXOL 300 MG/ML  SOLN
100.0000 mL | Freq: Once | INTRAMUSCULAR | Status: AC | PRN
  Administered 2014-02-17: 100 mL via INTRAVENOUS

## 2014-02-18 ENCOUNTER — Telehealth: Payer: Self-pay | Admitting: Gastroenterology

## 2014-02-18 DIAGNOSIS — N281 Cyst of kidney, acquired: Secondary | ICD-10-CM

## 2014-02-18 NOTE — Telephone Encounter (Signed)
Let the patient know that the CT scan shows a mass in the cecum (as expected) but no other problems (which is good). Pathology pending. Dr. Deatra Ina should get back to them when that result is available.

## 2014-02-18 NOTE — Telephone Encounter (Signed)
Dr. Henrene Pastor you are MD of the day, Dr. Deatra Ina is out of the office.  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 Path results are not back, but the CT is.  Please review and advise

## 2014-02-18 NOTE — Telephone Encounter (Signed)
Dr. 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   Patient is scheduled at [facility] for 02/28/14 5 pm arrival and be NPO for 4 hours prior  Patient notified of the CT results and MRI.  He is also advised that the pathology report has not returned and that he will be contacted when it does.

## 2014-02-21 ENCOUNTER — Telehealth: Payer: Self-pay | Admitting: Gastroenterology

## 2014-02-21 ENCOUNTER — Ambulatory Visit (INDEPENDENT_AMBULATORY_CARE_PROVIDER_SITE_OTHER): Payer: Medicare Other | Admitting: General Surgery

## 2014-02-21 NOTE — Telephone Encounter (Signed)
Let pt know that the results are not back yet. Will call pt when results are back.

## 2014-02-22 ENCOUNTER — Encounter: Payer: Self-pay | Admitting: *Deleted

## 2014-02-23 ENCOUNTER — Telehealth: Payer: Self-pay | Admitting: Gastroenterology

## 2014-02-23 NOTE — Telephone Encounter (Signed)
Past report does not show any evidence for cancer.  This is not necessarily mean that there is not cancer somewhere in the polyp but it does indicate that it is not a grossly malignant tumor, which is very good news.

## 2014-02-23 NOTE — Telephone Encounter (Signed)
Spoke with pt and he is aware. 

## 2014-02-23 NOTE — Telephone Encounter (Signed)
Pt calling for path results. Please advise. 

## 2014-02-28 ENCOUNTER — Ambulatory Visit (HOSPITAL_COMMUNITY)
Admission: RE | Admit: 2014-02-28 | Discharge: 2014-02-28 | Disposition: A | Discharge status: Home / Self Care | Payer: Medicare Other | Source: Ambulatory Visit | Attending: Gastroenterology | Admitting: Gastroenterology

## 2014-02-28 ENCOUNTER — Other Ambulatory Visit: Payer: Self-pay | Admitting: Gastroenterology

## 2014-02-28 DIAGNOSIS — N281 Cyst of kidney, acquired: Secondary | ICD-10-CM | POA: Diagnosis not present

## 2014-02-28 DIAGNOSIS — N289 Disorder of kidney and ureter, unspecified: Secondary | ICD-10-CM | POA: Diagnosis present

## 2014-02-28 MED ORDER — GADOBENATE DIMEGLUMINE 529 MG/ML IV SOLN
20.0000 mL | Freq: Once | INTRAVENOUS | Status: AC | PRN
  Administered 2014-02-28: 18 mL via INTRAVENOUS

## 2014-03-02 ENCOUNTER — Ambulatory Visit (INDEPENDENT_AMBULATORY_CARE_PROVIDER_SITE_OTHER): Payer: Medicare Other | Admitting: General Surgery

## 2014-03-02 ENCOUNTER — Telehealth: Payer: Self-pay | Admitting: Gastroenterology

## 2014-03-02 NOTE — Telephone Encounter (Signed)
Needs urology consult  MR cannot sort out if it is a cancer or not  Please schedule the consult

## 2014-03-02 NOTE — Telephone Encounter (Signed)
Pt aware. Records faxed to [facility] and waiting to hear back regarding appt. [facility] called to follow-up regarding follow-up appt/surgery. Possibly appt 03/02/14@[facility]  is an old appt. Waiting to hear back from both offices.

## 2014-03-02 NOTE — Telephone Encounter (Signed)
Dr. 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 Bx results were good. Pt has seen [facility] for resection. CT showed suspicious area in kidney and recommended MRI. Pt is calling for MRI results. Dr. Carlean Purl as doc of the day please advise.

## 2014-03-02 NOTE — Telephone Encounter (Signed)
Also still needs to see surgery

## 2014-03-02 NOTE — Telephone Encounter (Signed)
Pt scheduled to see Dr. Tresa Moore with [facility] 03/15/14@11 :15am, pt to arrive there at 11am. Still waiting to hear back from [place].

## 2014-03-03 ENCOUNTER — Telehealth (INDEPENDENT_AMBULATORY_CARE_PROVIDER_SITE_OTHER): Payer: Self-pay | Admitting: General Surgery

## 2014-03-03 ENCOUNTER — Telehealth (INDEPENDENT_AMBULATORY_CARE_PROVIDER_SITE_OTHER): Payer: Self-pay

## 2014-03-03 NOTE — Telephone Encounter (Signed)
Linda from Dr. Kelby Fam office called in to let us know that she spoke with this patient this morning and this patient seemed confused as to what our next step was for him.  Informed her that we have sent his paperwork around to the surgery schedulers and that they should be contacting him soon.  Vaughan Basta then explained that Dr. Deatra Ina ordered an MRI abd pelvis on 03/01/14 and it came back showing the possibility of a renal cell carcinoma so they have now set him up with Dr. Tresa Moore at [facility] urology on 8/18.  They wanted to inform Dr. Barry Dienes of this information due to it possibly changing the POC for this patient.  They would also like [place] to speak with the patient to help him understand what we will do from here on out.

## 2014-03-03 NOTE — Telephone Encounter (Signed)
Pt will need to schedule surgery.  He does have a renal mass that needs evaluation.  I would wait to schedule surgery until he sees Dr. Tresa Moore.  If he needs combined surgery we can set that up.

## 2014-03-03 NOTE — Telephone Encounter (Signed)
Pt was seen by Dr. Barry Dienes for a colon mass and surgery is to be scheduled.

## 2014-03-03 NOTE — Telephone Encounter (Signed)
Spoke with pt and he is aware of appt with urology. Spoke with [facility] Triage nurse and she will talk to Dr. Barry Dienes and they will call the pt regarding his plan of care. Pt knows [facility] will be calling.

## 2014-03-07 ENCOUNTER — Other Ambulatory Visit: Payer: Self-pay | Admitting: Urology

## 2014-03-08 ENCOUNTER — Telehealth (INDEPENDENT_AMBULATORY_CARE_PROVIDER_SITE_OTHER): Payer: Self-pay

## 2014-03-08 NOTE — Telephone Encounter (Signed)
Pt is calling with additional questions and concerns about his upcoming surgery.  He has been scheduled for 04/01/14 and is questioning why he has to wait an additional 3 weeks.  I explained that to schedule a coordinated case with Dr. Tresa Moore was more difficult because the doctor's schedules have to coincide.  The patient would like to speak with Dr. Barry Dienes again before his surgery.  Dr. Barry Dienes to be made aware.

## 2014-03-09 ENCOUNTER — Telehealth (INDEPENDENT_AMBULATORY_CARE_PROVIDER_SITE_OTHER): Payer: Self-pay

## 2014-03-09 ENCOUNTER — Telehealth: Payer: Self-pay | Admitting: Gastroenterology

## 2014-03-09 NOTE — Telephone Encounter (Signed)
I spoke to the patient, and he wanted to know if he had cancer.  I told him that the mass they found was NOT cancer.   He also stated that he has a "place on his kidney too."  I referred him back to his Urologist for that answer.  Patient was very relieved that he didn't have cancer of the colon.

## 2014-03-09 NOTE — Telephone Encounter (Signed)
LM with pt's wife appt on 8/17 at 8:45 with Dr. Barry Dienes to discuss surgery on 04/01/14.

## 2014-03-14 ENCOUNTER — Encounter (INDEPENDENT_AMBULATORY_CARE_PROVIDER_SITE_OTHER): Payer: Self-pay | Admitting: General Surgery

## 2014-03-14 ENCOUNTER — Ambulatory Visit (INDEPENDENT_AMBULATORY_CARE_PROVIDER_SITE_OTHER): Payer: Medicare Other | Admitting: General Surgery

## 2014-03-14 VITALS — BP 126/76 | HR 73 | Temp 97.6°F | Ht 72.0 in | Wt 180.0 lb

## 2014-03-14 DIAGNOSIS — K6389 Other specified diseases of intestine: Secondary | ICD-10-CM

## 2014-03-14 NOTE — Progress Notes (Signed)
HISTORY: Pt is here in follow up regarding his cecal polyp.  He has many questions about surgery and we went through them.  His main concerns are about why so much needs to come out and what we would be doing.     PERTINENT REVIEW OF SYSTEMS: Otherwise negative x11.    Filed Vitals:   03/14/14 0840  BP: 126/76  Pulse: 73  Temp: 97.6 F (36.4 C)   Wt Readings from Last 3 Encounters:  03/14/14 180 lb (81.647 kg)  02/16/14 180 lb (81.647 kg)  02/15/14 181 lb (82.101 kg)    EXAM: Head: Normocephalic and atraumatic.  Eyes:  Conjunctivae are normal. Pupils are equal, round, and reactive to light. No scleral icterus.  Neck:  Normal range of motion. Neck supple. No tracheal deviation present. No thyromegaly present.  Resp: No respiratory distress, normal effort. Abd:  Abdomen is soft, non distended and non tender. No masses are palpable.  There is no rebound and no guarding.  Neurological: Alert and oriented to person, place, and time. Coordination normal.  Skin: Skin is warm and dry. No rash noted. No diaphoretic. No erythema. No pallor.  Psychiatric: Normal mood and affect. Normal behavior. Judgment and thought content normal.      ASSESSMENT AND PLAN:   Colonic mass (pathology pending) I recommend removing the right colon due to the polyp that is unresectable at colonoscopy.   He is scheduled for appt with Dr. Tresa Moore tomorrow to fully flesh out plan with kidney mass.   We will do this in the same OR setting.    We reviewed the surgery, and the risks and benefits.    He is set up for 9/4.    25 min spent answering questions in counseling about surgery.     Milus Height, MD Surgical Oncology, [facility]  [name], MD No ref. provider found  Maurice Small

## 2014-03-14 NOTE — Patient Instructions (Signed)
I will see you in a few weeks.    We will give you the bowel prep instructions.

## 2014-03-14 NOTE — Assessment & Plan Note (Signed)
I recommend removing the right colon due to the polyp that is unresectable at colonoscopy.   He is scheduled for appt with Dr. Tresa Moore tomorrow to fully flesh out plan with kidney mass.   We will do this in the same OR setting.    We reviewed the surgery, and the risks and benefits.    He is set up for 9/4.

## 2014-03-24 ENCOUNTER — Encounter (HOSPITAL_COMMUNITY): Payer: Self-pay | Admitting: Pharmacy Technician

## 2014-03-25 ENCOUNTER — Other Ambulatory Visit (HOSPITAL_COMMUNITY): Payer: Self-pay | Admitting: Anesthesiology

## 2014-03-25 NOTE — Progress Notes (Signed)
Chest ct 02-17-14 epic

## 2014-03-25 NOTE — Patient Instructions (Addendum)
22 Jorge Ross  03/25/2014   Your procedure is scheduled on: Friday September 4th, 2015  Report to [facility] Main Entrance and follow signs to  [place] at  530 AM.  Call this number if you have problems the morning of surgery [phone]   Remember:  Do not eat food  :After Midnight Wednesday   clear liquids all day Thursday per dr byerly's instructions              No clear liquids after midnight Thursday night.  Take these medicines the morning of surgery with A SIP OF WATER: no meds to take                               You may not have any metal on your body including hair pins and piercings  Do not wear jewelry, make-up, lotions, powders, or deodorant.   Men may shave face and neck.  Do not bring valuables to the hospital. [place].  Contacts, dentures or bridgework may not be worn into surgery.  Leave suitcase in the car. After surgery it may be brought to your room.  For patients admitted to the hospital, checkout time is 11:00 AM the day of discharge.   ________________________________________________________________________  [facility] - Preparing for Surgery Before surgery, you can play an important role.  Because skin is not sterile, your skin needs to be as free of germs as possible.  You can reduce the number of germs on your skin by washing with CHG (chlorahexidine gluconate) soap before surgery.  CHG is an antiseptic cleaner which kills germs and bonds with the skin to continue killing germs even after washing. Please DO NOT use if you have an allergy to CHG or antibacterial soaps.  If your skin becomes reddened/irritated stop using the CHG and inform your nurse when you arrive at Short Stay. Do not shave (including legs and underarms) for at least 48 hours prior to the first CHG shower.  You may shave your face/neck. Please follow these instructions carefully:  1.  Shower with CHG Soap the night before surgery  and the  morning of Surgery.  2.  If you choose to wash your hair, wash your hair first as usual with your  normal  shampoo.  3.  After you shampoo, rinse your hair and body thoroughly to remove the  shampoo.                           4.  Use CHG as you would any other liquid soap.  You can apply chg directly  to the skin and wash                       Gently with a scrungie or clean washcloth.  5.  Apply the CHG Soap to your body ONLY FROM THE NECK DOWN.   Do not use on face/ open                           Wound or open sores. Avoid contact with eyes, ears mouth and genitals (private parts).                       Wash face,  Genitals (private parts) with your normal soap.  6.  Wash thoroughly, paying special attention to the area where your surgery  will be performed.  7.  Thoroughly rinse your body with warm water from the neck down.  8.  DO NOT shower/wash with your normal soap after using and rinsing off  the CHG Soap.                9.  Pat yourself dry with a clean towel.            10.  Wear clean pajamas.            11.  Place clean sheets on your bed the night of your first shower and do not  sleep with pets. Day of Surgery : Do not apply any lotions/deodorants the morning of surgery.  Please wear clean clothes to the hospital/surgery center.  FAILURE TO FOLLOW THESE INSTRUCTIONS MAY RESULT IN THE CANCELLATION OF YOUR SURGERY PATIENT SIGNATURE_________________________________  NURSE SIGNATURE__________________________________  ________________________________________________________________________

## 2014-03-28 ENCOUNTER — Encounter (HOSPITAL_COMMUNITY): Payer: Self-pay

## 2014-03-28 ENCOUNTER — Encounter (INDEPENDENT_AMBULATORY_CARE_PROVIDER_SITE_OTHER): Payer: Self-pay

## 2014-03-28 ENCOUNTER — Encounter (HOSPITAL_COMMUNITY)
Admission: RE | Admit: 2014-03-28 | Discharge: 2014-03-28 | Disposition: A | Discharge status: Home / Self Care | Payer: Medicare Other | Source: Ambulatory Visit | Attending: General Surgery | Admitting: General Surgery

## 2014-03-28 DIAGNOSIS — Z01818 Encounter for other preprocedural examination: Secondary | ICD-10-CM | POA: Insufficient documentation

## 2014-03-28 DIAGNOSIS — C180 Malignant neoplasm of cecum: Secondary | ICD-10-CM | POA: Insufficient documentation

## 2014-03-28 HISTORY — DX: Malignant (primary) neoplasm, unspecified: C80.1

## 2014-03-28 LAB — CBC
HCT: 39.5 % (ref 39.0–52.0)
Hemoglobin: 13.8 g/dL (ref 13.0–17.0)
MCH: 32.5 pg (ref 26.0–34.0)
MCHC: 34.9 g/dL (ref 30.0–36.0)
MCV: 92.9 fL (ref 78.0–100.0)
PLATELETS: 217 10*3/uL (ref 150–400)
RBC: 4.25 MIL/uL (ref 4.22–5.81)
RDW: 12.2 % (ref 11.5–15.5)
WBC: 4.2 10*3/uL (ref 4.0–10.5)

## 2014-03-28 LAB — BASIC METABOLIC PANEL
ANION GAP: 10 (ref 5–15)
BUN: 16 mg/dL (ref 6–23)
CO2: 30 mEq/L (ref 19–32)
CREATININE: 0.83 mg/dL (ref 0.50–1.35)
Calcium: 9.7 mg/dL (ref 8.4–10.5)
Chloride: 99 mEq/L (ref 96–112)
GFR, EST NON AFRICAN AMERICAN: 86 mL/min — AB (ref >90)
Glucose, Bld: 101 mg/dL — ABNORMAL HIGH (ref 70–99)
Potassium: 4.5 mEq/L (ref 3.7–5.3)
Sodium: 139 mEq/L (ref 137–147)

## 2014-03-28 LAB — HEMOGLOBIN A1C
Hgb A1c MFr Bld: 5.7 % — ABNORMAL HIGH (ref <5.7)
MEAN PLASMA GLUCOSE: 117 mg/dL — AB (ref <117)

## 2014-03-28 LAB — ABO/RH: ABO/RH(D): B POS

## 2014-03-28 NOTE — Progress Notes (Signed)
OLD EKG 01-12-14 DR Jeanann Lewandowsky ABNORMAL EKG REPEATED WITH PRE OP LABS TODAY.

## 2014-03-28 NOTE — Progress Notes (Signed)
03/28/14 1029  OBSTRUCTIVE SLEEP APNEA  Have you ever been diagnosed with sleep apnea through a sleep study? No  Do you snore loudly (loud enough to be heard through closed doors)?  1  Do you often feel tired, fatigued, or sleepy during the daytime? 0  Has anyone observed you stop breathing during your sleep? 0  Do you have, or are you being treated for high blood pressure? 1  BMI more than 35 kg/m2? 0  Age over 71 years old? 1  Neck circumference greater than 40 cm/16 inches? 0  Gender: 1  Obstructive Sleep Apnea Score 4  Score 4 or greater  Results sent to PCP

## 2014-03-29 HISTORY — PX: COLECTOMY: SHX59

## 2014-04-01 ENCOUNTER — Inpatient Hospital Stay (HOSPITAL_COMMUNITY)
Admission: RE | Admit: 2014-04-01 | Discharge: 2014-04-08 | DRG: 330 | Disposition: A | Discharge status: Home Health | Payer: Medicare Other | Source: Ambulatory Visit | Attending: General Surgery | Admitting: General Surgery

## 2014-04-01 ENCOUNTER — Encounter (HOSPITAL_COMMUNITY)
Admission: RE | Disposition: A | Discharge status: Home Health | Payer: Self-pay | Source: Ambulatory Visit | Attending: General Surgery

## 2014-04-01 ENCOUNTER — Encounter (HOSPITAL_COMMUNITY): Payer: Medicare Other | Admitting: Anesthesiology

## 2014-04-01 ENCOUNTER — Inpatient Hospital Stay (HOSPITAL_COMMUNITY): Payer: Medicare Other | Admitting: Anesthesiology

## 2014-04-01 ENCOUNTER — Encounter (HOSPITAL_COMMUNITY): Payer: Self-pay | Admitting: *Deleted

## 2014-04-01 DIAGNOSIS — I447 Left bundle-branch block, unspecified: Secondary | ICD-10-CM | POA: Diagnosis present

## 2014-04-01 DIAGNOSIS — K560 Paralytic ileus: Secondary | ICD-10-CM | POA: Diagnosis not present

## 2014-04-01 DIAGNOSIS — N2889 Other specified disorders of kidney and ureter: Secondary | ICD-10-CM | POA: Diagnosis present

## 2014-04-01 DIAGNOSIS — D126 Benign neoplasm of colon, unspecified: Secondary | ICD-10-CM

## 2014-04-01 DIAGNOSIS — C180 Malignant neoplasm of cecum: Secondary | ICD-10-CM | POA: Diagnosis present

## 2014-04-01 DIAGNOSIS — Z8042 Family history of malignant neoplasm of prostate: Secondary | ICD-10-CM | POA: Diagnosis not present

## 2014-04-01 DIAGNOSIS — N281 Cyst of kidney, acquired: Secondary | ICD-10-CM | POA: Diagnosis present

## 2014-04-01 DIAGNOSIS — M129 Arthropathy, unspecified: Secondary | ICD-10-CM | POA: Diagnosis present

## 2014-04-01 DIAGNOSIS — Z01812 Encounter for preprocedural laboratory examination: Secondary | ICD-10-CM

## 2014-04-01 DIAGNOSIS — C649 Malignant neoplasm of unspecified kidney, except renal pelvis: Secondary | ICD-10-CM | POA: Diagnosis present

## 2014-04-01 DIAGNOSIS — Z801 Family history of malignant neoplasm of trachea, bronchus and lung: Secondary | ICD-10-CM | POA: Diagnosis not present

## 2014-04-01 DIAGNOSIS — K921 Melena: Secondary | ICD-10-CM | POA: Diagnosis present

## 2014-04-01 DIAGNOSIS — I10 Essential (primary) hypertension: Secondary | ICD-10-CM | POA: Diagnosis present

## 2014-04-01 HISTORY — PX: COLON SURGERY: SHX602

## 2014-04-01 HISTORY — PX: ROBOT ASSISTED LAPAROSCOPIC NEPHRECTOMY: SHX5140

## 2014-04-01 LAB — CBC
HEMATOCRIT: 42.3 % (ref 39.0–52.0)
HEMOGLOBIN: 14.9 g/dL (ref 13.0–17.0)
MCH: 32.7 pg (ref 26.0–34.0)
MCHC: 35.2 g/dL (ref 30.0–36.0)
MCV: 93 fL (ref 78.0–100.0)
Platelets: 215 10*3/uL (ref 150–400)
RBC: 4.55 MIL/uL (ref 4.22–5.81)
RDW: 12.2 % (ref 11.5–15.5)
WBC: 13.6 10*3/uL — AB (ref 4.0–10.5)

## 2014-04-01 LAB — TYPE AND SCREEN
ABO/RH(D): B POS
ANTIBODY SCREEN: NEGATIVE

## 2014-04-01 LAB — HEMOGLOBIN AND HEMATOCRIT, BLOOD
HCT: 42.1 % (ref 39.0–52.0)
Hemoglobin: 15 g/dL (ref 13.0–17.0)

## 2014-04-01 LAB — BASIC METABOLIC PANEL
ANION GAP: 17 — AB (ref 5–15)
BUN: 16 mg/dL (ref 6–23)
CHLORIDE: 95 meq/L — AB (ref 96–112)
CO2: 22 meq/L (ref 19–32)
CREATININE: 1.04 mg/dL (ref 0.50–1.35)
Calcium: 8.8 mg/dL (ref 8.4–10.5)
GFR calc Af Amer: 81 mL/min — ABNORMAL LOW (ref >90)
GFR calc non Af Amer: 70 mL/min — ABNORMAL LOW (ref >90)
Glucose, Bld: 211 mg/dL — ABNORMAL HIGH (ref 70–99)
Potassium: 3.7 mEq/L (ref 3.7–5.3)
SODIUM: 134 meq/L — AB (ref 137–147)

## 2014-04-01 SURGERY — COLECTOMY, PARTIAL, ROBOT-ASSISTED, LAPAROSCOPIC
Anesthesia: General | Site: Abdomen

## 2014-04-01 MED ORDER — PROPOFOL 10 MG/ML IV BOLUS
INTRAVENOUS | Status: AC
  Filled 2014-04-01: qty 20

## 2014-04-01 MED ORDER — CEFAZOLIN SODIUM-DEXTROSE 2-3 GM-% IV SOLR: 2.0000 g | INTRAVENOUS | Status: DC

## 2014-04-01 MED ORDER — DEXTROSE 5 % IV SOLN
2.0000 g | INTRAVENOUS | Status: AC
  Administered 2014-04-01: 2 g via INTRAVENOUS
  Filled 2014-04-01: qty 2

## 2014-04-01 MED ORDER — CISATRACURIUM BESYLATE 20 MG/10ML IV SOLN
INTRAVENOUS | Status: AC
  Filled 2014-04-01: qty 20

## 2014-04-01 MED ORDER — KCL IN DEXTROSE-NACL 20-5-0.45 MEQ/L-%-% IV SOLN
INTRAVENOUS | Status: AC
  Filled 2014-04-01: qty 1000

## 2014-04-01 MED ORDER — MANNITOL 25 % IV SOLN
25.0000 g | Freq: Once | INTRAVENOUS | Status: AC
  Administered 2014-04-01 (×2): 12.5 g via INTRAVENOUS
  Filled 2014-04-01: qty 100

## 2014-04-01 MED ORDER — BUPIVACAINE-EPINEPHRINE 0.25% -1:200000 IJ SOLN
INTRAMUSCULAR | Status: DC | PRN
  Administered 2014-04-01: 30 mL

## 2014-04-01 MED ORDER — BUPIVACAINE LIPOSOME 1.3 % IJ SUSP
20.0000 mL | Freq: Once | INTRAMUSCULAR | Status: DC
  Filled 2014-04-01: qty 20

## 2014-04-01 MED ORDER — LACTATED RINGERS IV SOLN
INTRAVENOUS | Status: DC | PRN
  Administered 2014-04-01 (×2): via INTRAVENOUS

## 2014-04-01 MED ORDER — FENTANYL CITRATE 0.05 MG/ML IJ SOLN
INTRAMUSCULAR | Status: DC | PRN
  Administered 2014-04-01: 50 ug via INTRAVENOUS
  Administered 2014-04-01 (×2): 100 ug via INTRAVENOUS
  Administered 2014-04-01 (×3): 50 ug via INTRAVENOUS
  Administered 2014-04-01: 100 ug via INTRAVENOUS

## 2014-04-01 MED ORDER — SODIUM CHLORIDE 0.9 % IJ SOLN
INTRAMUSCULAR | Status: AC
  Filled 2014-04-01: qty 10

## 2014-04-01 MED ORDER — LIDOCAINE HCL (PF) 1 % IJ SOLN
INTRAMUSCULAR | Status: DC | PRN
  Administered 2014-04-01: 20 mL

## 2014-04-01 MED ORDER — PROPOFOL 10 MG/ML IV BOLUS
INTRAVENOUS | Status: DC | PRN
  Administered 2014-04-01: 140 mg via INTRAVENOUS

## 2014-04-01 MED ORDER — ALVIMOPAN 12 MG PO CAPS
12.0000 mg | ORAL_CAPSULE | Freq: Once | ORAL | Status: AC
  Administered 2014-04-01: 12 mg via ORAL
  Filled 2014-04-01: qty 1

## 2014-04-01 MED ORDER — FENTANYL CITRATE 0.05 MG/ML IJ SOLN
INTRAMUSCULAR | Status: AC
  Filled 2014-04-01: qty 5

## 2014-04-01 MED ORDER — GLYCOPYRROLATE 0.2 MG/ML IJ SOLN
INTRAMUSCULAR | Status: AC
  Filled 2014-04-01: qty 1

## 2014-04-01 MED ORDER — CISATRACURIUM BESYLATE (PF) 10 MG/5ML IV SOLN
INTRAVENOUS | Status: DC | PRN
  Administered 2014-04-01 (×3): 4 mg via INTRAVENOUS
  Administered 2014-04-01: 2 mg via INTRAVENOUS
  Administered 2014-04-01: 4 mg via INTRAVENOUS
  Administered 2014-04-01 (×2): 6 mg via INTRAVENOUS
  Administered 2014-04-01 (×2): 4 mg via INTRAVENOUS

## 2014-04-01 MED ORDER — DIPHENHYDRAMINE HCL 12.5 MG/5ML PO ELIX: 12.5000 mg | ORAL_SOLUTION | Freq: Four times a day (QID) | ORAL | Status: DC | PRN

## 2014-04-01 MED ORDER — CISATRACURIUM BESYLATE 20 MG/10ML IV SOLN
INTRAVENOUS | Status: AC
  Filled 2014-04-01: qty 10

## 2014-04-01 MED ORDER — HYDROCHLOROTHIAZIDE 12.5 MG PO CAPS
12.5000 mg | ORAL_CAPSULE | Freq: Every day | ORAL | Status: DC
  Administered 2014-04-02: 12.5 mg via ORAL
  Filled 2014-04-01: qty 1

## 2014-04-01 MED ORDER — ONDANSETRON HCL 4 MG/2ML IJ SOLN
INTRAMUSCULAR | Status: DC | PRN
  Administered 2014-04-01: 4 mg via INTRAVENOUS

## 2014-04-01 MED ORDER — NEOSTIGMINE METHYLSULFATE 10 MG/10ML IV SOLN
INTRAVENOUS | Status: DC | PRN
  Administered 2014-04-01: 5 mg via INTRAVENOUS

## 2014-04-01 MED ORDER — NEOSTIGMINE METHYLSULFATE 10 MG/10ML IV SOLN
INTRAVENOUS | Status: AC
  Filled 2014-04-01: qty 1

## 2014-04-01 MED ORDER — MORPHINE SULFATE (PF) 1 MG/ML IV SOLN
INTRAVENOUS | Status: DC
  Administered 2014-04-01: 1 mg via INTRAVENOUS
  Administered 2014-04-02: 4 mg via INTRAVENOUS
  Administered 2014-04-02: 1 mg via INTRAVENOUS
  Filled 2014-04-01: qty 25

## 2014-04-01 MED ORDER — DIPHENHYDRAMINE HCL 50 MG/ML IJ SOLN: 12.5000 mg | Freq: Four times a day (QID) | INTRAMUSCULAR | Status: DC | PRN

## 2014-04-01 MED ORDER — BUPIVACAINE-EPINEPHRINE (PF) 0.25% -1:200000 IJ SOLN
INTRAMUSCULAR | Status: AC
  Filled 2014-04-01: qty 30

## 2014-04-01 MED ORDER — GLYCOPYRROLATE 0.2 MG/ML IJ SOLN
INTRAMUSCULAR | Status: DC | PRN
  Administered 2014-04-01: .8 mg via INTRAVENOUS
  Administered 2014-04-01: 0.2 mg via INTRAVENOUS

## 2014-04-01 MED ORDER — HYDROMORPHONE HCL PF 1 MG/ML IJ SOLN: 0.2500 mg | INTRAMUSCULAR | Status: DC | PRN

## 2014-04-01 MED ORDER — LACTATED RINGERS IV SOLN
INTRAVENOUS | Status: DC | PRN
  Administered 2014-04-01 (×4): via INTRAVENOUS

## 2014-04-01 MED ORDER — DEXAMETHASONE SODIUM PHOSPHATE 10 MG/ML IJ SOLN
INTRAMUSCULAR | Status: DC | PRN
  Administered 2014-04-01: 10 mg via INTRAVENOUS

## 2014-04-01 MED ORDER — METOCLOPRAMIDE HCL 5 MG/ML IJ SOLN
INTRAMUSCULAR | Status: AC
  Filled 2014-04-01: qty 2

## 2014-04-01 MED ORDER — KCL IN DEXTROSE-NACL 20-5-0.45 MEQ/L-%-% IV SOLN
INTRAVENOUS | Status: DC
  Administered 2014-04-02 (×2): via INTRAVENOUS
  Filled 2014-04-01 (×4): qty 1000

## 2014-04-01 MED ORDER — SUCCINYLCHOLINE CHLORIDE 20 MG/ML IJ SOLN
INTRAMUSCULAR | Status: DC | PRN
  Administered 2014-04-01: 100 mg via INTRAVENOUS

## 2014-04-01 MED ORDER — PROMETHAZINE HCL 25 MG/ML IJ SOLN: 6.2500 mg | INTRAMUSCULAR | Status: DC | PRN

## 2014-04-01 MED ORDER — IRBESARTAN 150 MG PO TABS
150.0000 mg | ORAL_TABLET | Freq: Every day | ORAL | Status: DC
  Administered 2014-04-02: 150 mg via ORAL
  Filled 2014-04-01: qty 1

## 2014-04-01 MED ORDER — ACETAMINOPHEN 325 MG PO TABS: 650.0000 mg | ORAL_TABLET | Freq: Four times a day (QID) | ORAL | Status: DC | PRN

## 2014-04-01 MED ORDER — SODIUM CHLORIDE 0.9 % IJ SOLN
INTRAMUSCULAR | Status: AC
  Filled 2014-04-01: qty 20

## 2014-04-01 MED ORDER — IRBESARTAN-HYDROCHLOROTHIAZIDE 150-12.5 MG PO TABS: 1.0000 | ORAL_TABLET | Freq: Every morning | ORAL | Status: DC

## 2014-04-01 MED ORDER — ONDANSETRON HCL 4 MG/2ML IJ SOLN: 4.0000 mg | Freq: Four times a day (QID) | INTRAMUSCULAR | Status: DC | PRN

## 2014-04-01 MED ORDER — LIDOCAINE HCL 1 % IJ SOLN
INTRAMUSCULAR | Status: AC
  Filled 2014-04-01: qty 20

## 2014-04-01 MED ORDER — INDOCYANINE GREEN 25 MG IV SOLR
INTRAVENOUS | Status: DC | PRN
  Administered 2014-04-01: 5 mg via INTRAVENOUS

## 2014-04-01 MED ORDER — LACTATED RINGERS IR SOLN
Status: DC | PRN
  Administered 2014-04-01: 1000 mL

## 2014-04-01 MED ORDER — METOCLOPRAMIDE HCL 5 MG/ML IJ SOLN
INTRAMUSCULAR | Status: DC | PRN
  Administered 2014-04-01: 10 mg via INTRAVENOUS

## 2014-04-01 MED ORDER — METOPROLOL TARTRATE 1 MG/ML IV SOLN
INTRAVENOUS | Status: DC | PRN
  Administered 2014-04-01 (×2): 1 mg via INTRAVENOUS

## 2014-04-01 MED ORDER — CEFAZOLIN SODIUM-DEXTROSE 2-3 GM-% IV SOLR
INTRAVENOUS | Status: AC
  Filled 2014-04-01: qty 50

## 2014-04-01 MED ORDER — 0.9 % SODIUM CHLORIDE (POUR BTL) OPTIME
TOPICAL | Status: DC | PRN
  Administered 2014-04-01: 2000 mL

## 2014-04-01 MED ORDER — MORPHINE SULFATE 2 MG/ML IJ SOLN
1.0000 mg | INTRAMUSCULAR | Status: DC | PRN
Start: 2014-04-01 — End: 2014-04-01

## 2014-04-01 MED ORDER — CEFOTETAN DISODIUM-DEXTROSE 2-2.08 GM-% IV SOLR
INTRAVENOUS | Status: AC
  Filled 2014-04-01: qty 50

## 2014-04-01 MED ORDER — LACTATED RINGERS IV SOLN: INTRAVENOUS | Status: DC

## 2014-04-01 MED ORDER — ALUM & MAG HYDROXIDE-SIMETH 200-200-20 MG/5ML PO SUSP: 30.0000 mL | Freq: Four times a day (QID) | ORAL | Status: DC | PRN

## 2014-04-01 MED ORDER — ALVIMOPAN 12 MG PO CAPS
12.0000 mg | ORAL_CAPSULE | Freq: Two times a day (BID) | ORAL | Status: DC
  Administered 2014-04-02: 12 mg via ORAL
  Filled 2014-04-01 (×2): qty 1

## 2014-04-01 MED ORDER — GLYCOPYRROLATE 0.2 MG/ML IJ SOLN
INTRAMUSCULAR | Status: AC
  Filled 2014-04-01: qty 4

## 2014-04-01 MED ORDER — NALOXONE HCL 0.4 MG/ML IJ SOLN: 0.4000 mg | INTRAMUSCULAR | Status: DC | PRN

## 2014-04-01 MED ORDER — HYDROMORPHONE HCL PF 2 MG/ML IJ SOLN
INTRAMUSCULAR | Status: AC
  Filled 2014-04-01: qty 1

## 2014-04-01 MED ORDER — HYDROMORPHONE HCL PF 1 MG/ML IJ SOLN
INTRAMUSCULAR | Status: DC | PRN
  Administered 2014-04-01: 0.5 mg via INTRAVENOUS
  Administered 2014-04-01: 1 mg via INTRAVENOUS

## 2014-04-01 MED ORDER — DEXAMETHASONE SODIUM PHOSPHATE 10 MG/ML IJ SOLN
INTRAMUSCULAR | Status: AC
  Filled 2014-04-01: qty 1

## 2014-04-01 MED ORDER — HEPARIN SODIUM (PORCINE) 5000 UNIT/ML IJ SOLN
5000.0000 [IU] | Freq: Three times a day (TID) | INTRAMUSCULAR | Status: DC
  Administered 2014-04-02 (×2): 5000 [IU] via SUBCUTANEOUS
  Filled 2014-04-01 (×5): qty 1

## 2014-04-01 MED ORDER — DEXTROSE 5 % IV SOLN
2.0000 g | Freq: Two times a day (BID) | INTRAVENOUS | Status: AC
  Administered 2014-04-02: 2 g via INTRAVENOUS
  Filled 2014-04-01: qty 2

## 2014-04-01 MED ORDER — ONDANSETRON HCL 4 MG PO TABS: 4.0000 mg | ORAL_TABLET | Freq: Four times a day (QID) | ORAL | Status: DC | PRN

## 2014-04-01 MED ORDER — SODIUM CHLORIDE 0.9 % IJ SOLN: 9.0000 mL | INTRAMUSCULAR | Status: DC | PRN

## 2014-04-01 MED ORDER — ONDANSETRON HCL 4 MG/2ML IJ SOLN
INTRAMUSCULAR | Status: AC
  Filled 2014-04-01: qty 2

## 2014-04-01 MED ORDER — DEXTROSE 5 % IV SOLN
2.0000 g | Freq: Once | INTRAVENOUS | Status: AC
  Administered 2014-04-01: 2 g via INTRAVENOUS

## 2014-04-01 MED ORDER — MORPHINE SULFATE (PF) 1 MG/ML IV SOLN
INTRAVENOUS | Status: AC
  Administered 2014-04-01: 1 mg
  Administered 2014-04-02: 3 mg
  Administered 2014-04-02: 5 mg
  Filled 2014-04-01: qty 25

## 2014-04-01 SURGICAL SUPPLY — 150 items
APPLICATOR SURGIFLO ENDO (HEMOSTASIS) ×4 IMPLANT
APPLIER CLIP 5 13 M/L LIGAMAX5 (MISCELLANEOUS)
APPLIER CLIP ROT 10 11.4 M/L (STAPLE)
BAG URINE DRAINAGE (UROLOGICAL SUPPLIES) IMPLANT
BAG URO CATCHER STRL LF (DRAPE) IMPLANT
BLADE 11 SAFETY STRL DISP (BLADE) ×4 IMPLANT
BLADE EXTENDED COATED 6.5IN (ELECTRODE) IMPLANT
BLADE HEX COATED 2.75 (ELECTRODE) IMPLANT
BLADE SURG 15 STRL LF DISP TIS (BLADE) ×2 IMPLANT
BLADE SURG 15 STRL SS (BLADE) ×2
BLADE SURG SZ10 CARB STEEL (BLADE) ×4 IMPLANT
CABLE HIGH FREQUENCY MONO STRZ (ELECTRODE) IMPLANT
CANISTER SUCTION 2500CC (MISCELLANEOUS) IMPLANT
CANNULA REDUC XI 12-8 STAPL (CANNULA)
CANNULA REDUC XI 12-8MM STAPL (CANNULA)
CANNULA REDUCER 12-8 DVNC XI (CANNULA) IMPLANT
CATH FOLEY 2WAY SLVR 30CC 24FR (CATHETERS) IMPLANT
CATH KIT ON Q 7.5IN SLV (PAIN MANAGEMENT) IMPLANT
CATH ROBINSON RED A/P 16FR (CATHETERS) IMPLANT
CELLS DAT CNTRL 66122 CELL SVR (MISCELLANEOUS) IMPLANT
CHLORAPREP W/TINT 26ML (MISCELLANEOUS) ×12 IMPLANT
CLIP APPLIE 5 13 M/L LIGAMAX5 (MISCELLANEOUS) IMPLANT
CLIP APPLIE ROT 10 11.4 M/L (STAPLE) IMPLANT
CLIP LIGATING HEM O LOK PURPLE (MISCELLANEOUS) ×8 IMPLANT
CLIP LIGATING HEMO O LOK GREEN (MISCELLANEOUS) ×12 IMPLANT
CLIP LIGATING HEMOLOK MED (MISCELLANEOUS) IMPLANT
CLOTH BEACON ORANGE TIMEOUT ST (SAFETY) IMPLANT
COVER MAYO STAND STRL (DRAPES) ×4 IMPLANT
COVER TIP SHEARS 8 DVNC (MISCELLANEOUS) ×2 IMPLANT
COVER TIP SHEARS 8MM DA VINCI (MISCELLANEOUS) ×2
DECANTER SPIKE VIAL GLASS SM (MISCELLANEOUS) ×4 IMPLANT
DERMABOND ADVANCED (GAUZE/BANDAGES/DRESSINGS) ×2
DERMABOND ADVANCED .7 DNX12 (GAUZE/BANDAGES/DRESSINGS) ×2 IMPLANT
DEVICE TROCAR PUNCTURE CLOSURE (ENDOMECHANICALS) IMPLANT
DRAIN CHANNEL 15F RND FF 3/16 (WOUND CARE) ×4 IMPLANT
DRAIN CHANNEL 19F RND (DRAIN) IMPLANT
DRAPE ARM DVNC X/XI (DISPOSABLE) ×8 IMPLANT
DRAPE CAMERA CLOSED 9X96 (DRAPES) ×4 IMPLANT
DRAPE COLUMN DVNC XI (DISPOSABLE) ×2 IMPLANT
DRAPE DA VINCI XI ARM (DISPOSABLE) ×8
DRAPE DA VINCI XI COLUMN (DISPOSABLE) ×2
DRAPE INCISE IOBAN 66X45 STRL (DRAPES) ×12 IMPLANT
DRAPE LAPAROSCOPIC ABDOMINAL (DRAPES) ×4 IMPLANT
DRAPE LG THREE QUARTER DISP (DRAPES) ×16 IMPLANT
DRAPE SURG IRRIG POUCH 19X23 (DRAPES) IMPLANT
DRAPE TABLE BACK 44X90 PK DISP (DRAPES) ×4 IMPLANT
DRAPE UTILITY W/TAPE 26X15 (DRAPES) ×4 IMPLANT
DRAPE WARM FLUID 44X44 (DRAPE) ×4 IMPLANT
DRSG OPSITE POSTOP 4X10 (GAUZE/BANDAGES/DRESSINGS) IMPLANT
DRSG OPSITE POSTOP 4X6 (GAUZE/BANDAGES/DRESSINGS) IMPLANT
DRSG OPSITE POSTOP 4X8 (GAUZE/BANDAGES/DRESSINGS) IMPLANT
DRSG TEGADERM 4X4.75 (GAUZE/BANDAGES/DRESSINGS) ×4 IMPLANT
DRSG TEGADERM 6X8 (GAUZE/BANDAGES/DRESSINGS) ×4 IMPLANT
ELECT PENCIL ROCKER SW 15FT (MISCELLANEOUS) ×4 IMPLANT
ELECT REM PT RETURN 9FT ADLT (ELECTROSURGICAL) ×4
ELECTRODE REM PT RTRN 9FT ADLT (ELECTROSURGICAL) ×2 IMPLANT
ENDOLOOP SUT PDS II  0 18 (SUTURE)
ENDOLOOP SUT PDS II 0 18 (SUTURE) IMPLANT
EVACUATOR SILICONE 100CC (DRAIN) ×4 IMPLANT
GAUZE SPONGE 2X2 8PLY STRL LF (GAUZE/BANDAGES/DRESSINGS) ×2 IMPLANT
GAUZE SPONGE 4X4 12PLY STRL (GAUZE/BANDAGES/DRESSINGS) IMPLANT
GLOVE BIO SURGEON STRL SZ 6 (GLOVE) ×12 IMPLANT
GLOVE BIOGEL M STRL SZ7.5 (GLOVE) ×8 IMPLANT
GLOVE INDICATOR 6.5 STRL GRN (GLOVE) ×16 IMPLANT
GOWN STRL REUS W/TWL 2XL LVL3 (GOWN DISPOSABLE) ×16 IMPLANT
GOWN STRL REUS W/TWL LRG LVL3 (GOWN DISPOSABLE) ×20 IMPLANT
GOWN STRL REUS W/TWL XL LVL3 (GOWN DISPOSABLE) ×24 IMPLANT
HEMOSTAT SURGICEL 4X8 (HEMOSTASIS) ×4 IMPLANT
HOLDER FOLEY CATH W/STRAP (MISCELLANEOUS) ×4 IMPLANT
KIT BASIN OR (CUSTOM PROCEDURE TRAY) ×8 IMPLANT
KIT PROCEDURE DA VINCI SI (MISCELLANEOUS) ×2
KIT PROCEDURE DVNC SI (MISCELLANEOUS) ×2 IMPLANT
LEGGING LITHOTOMY PAIR STRL (DRAPES) IMPLANT
LOOP VESSEL MAXI BLUE (MISCELLANEOUS) ×4 IMPLANT
LUBRICANT JELLY K Y 4OZ (MISCELLANEOUS) ×4 IMPLANT
MANIFOLD NEPTUNE II (INSTRUMENTS) ×8 IMPLANT
NEEDLE HYPO 22GX1.5 SAFETY (NEEDLE) ×12 IMPLANT
NEEDLE INSUFFLATION 14GA 120MM (NEEDLE) ×4 IMPLANT
PACK CARDIOVASCULAR III (CUSTOM PROCEDURE TRAY) ×4 IMPLANT
PACK CYSTO (CUSTOM PROCEDURE TRAY) IMPLANT
PACK GENERAL/GYN (CUSTOM PROCEDURE TRAY) ×4 IMPLANT
PEN SKIN MARKING BROAD (MISCELLANEOUS) ×4 IMPLANT
PENCIL BUTTON HOLSTER BLD 10FT (ELECTRODE) ×4 IMPLANT
POUCH SPECIMEN RETRIEVAL 10MM (ENDOMECHANICALS) ×4 IMPLANT
PUMP PAIN ON-Q (MISCELLANEOUS) IMPLANT
RTRCTR WOUND ALEXIS 18CM MED (MISCELLANEOUS)
SCISSORS LAP 5X35 DISP (ENDOMECHANICALS) IMPLANT
SCRUB PCMX 4 OZ (MISCELLANEOUS) IMPLANT
SEAL CANN UNIV 5-8 DVNC XI (MISCELLANEOUS) ×6 IMPLANT
SEAL XI 5MM-8MM UNIVERSAL (MISCELLANEOUS) ×6
SEALER TISSUE G2 STRG ARTC 35C (ENDOMECHANICALS) IMPLANT
SEALER VESSEL DA VINCI XI (MISCELLANEOUS) ×2
SEALER VESSEL EXT DVNC XI (MISCELLANEOUS) ×2 IMPLANT
SET IRRIG TUBING LAPAROSCOPIC (IRRIGATION / IRRIGATOR) ×4 IMPLANT
SLEEVE XCEL OPT CAN 5 100 (ENDOMECHANICALS) IMPLANT
SOLUTION ELECTROLUBE (MISCELLANEOUS) ×4 IMPLANT
SPONGE GAUZE 2X2 STER 10/PKG (GAUZE/BANDAGES/DRESSINGS) ×2
SPONGE LAP 18X18 X RAY DECT (DISPOSABLE) IMPLANT
STAPLER 45 BLU RELOAD XI (STAPLE) ×6 IMPLANT
STAPLER 45 BLUE RELOAD SI (STAPLE) IMPLANT
STAPLER 45 BLUE RELOAD XI (STAPLE) ×6
STAPLER 45 GREEN RELOAD SI (STAPLE) IMPLANT
STAPLER CANNULA SEAL (CANNULA) IMPLANT
STAPLER SHEATH (SHEATH) ×2
STAPLER SHEATH ENDOWRIST DVNC (SHEATH) ×2 IMPLANT
SUCTION POOLE TIP (SUCTIONS) ×8 IMPLANT
SURGIFLO W/THROMBIN 8M KIT (HEMOSTASIS) ×4 IMPLANT
SUT DVC VLOC 180 2-0 12IN GS21 (SUTURE) ×8
SUT ETHILON 3 0 PS 1 (SUTURE) ×4 IMPLANT
SUT MNCRL AB 4-0 PS2 18 (SUTURE) ×16 IMPLANT
SUT PDS AB 1 CT1 27 (SUTURE) ×16 IMPLANT
SUT PDS AB 1 CTX 36 (SUTURE) IMPLANT
SUT PDS AB 1 TP1 96 (SUTURE) IMPLANT
SUT PDS AB 2-0 CT2 27 (SUTURE) IMPLANT
SUT PROLENE 0 CT 2 (SUTURE) ×4 IMPLANT
SUT SILK 2 0 (SUTURE) ×2
SUT SILK 2 0 SH CR/8 (SUTURE) IMPLANT
SUT SILK 2-0 18XBRD TIE 12 (SUTURE) ×2 IMPLANT
SUT SILK 3 0 (SUTURE) ×2
SUT SILK 3 0 SH CR/8 (SUTURE) ×4 IMPLANT
SUT SILK 3-0 18XBRD TIE 12 (SUTURE) ×2 IMPLANT
SUT V-LOC BARB 180 2/0GR6 GS22 (SUTURE) ×12
SUT VIC AB 0 CT1 27 (SUTURE) ×2
SUT VIC AB 0 CT1 27XBRD ANTBC (SUTURE) ×2 IMPLANT
SUT VIC AB 2-0 SH 27 (SUTURE) ×2
SUT VIC AB 2-0 SH 27X BRD (SUTURE) ×2 IMPLANT
SUT VIC AB 3-0 SH 18 (SUTURE) ×4 IMPLANT
SUT VIC AB 3-0 SH 27 (SUTURE) ×2
SUT VIC AB 3-0 SH 27X BRD (SUTURE) ×2 IMPLANT
SUT VIC AB 3-0 SH 27XBRD (SUTURE) IMPLANT
SUT VICRYL 0 TIES 12 18 (SUTURE) IMPLANT
SUT VICRYL 0 UR6 27IN ABS (SUTURE) ×8 IMPLANT
SUT VLOC 180 2-0 9IN GS21 (SUTURE) IMPLANT
SUTURE DVC VL 180 2-0 12INGS21 (SUTURE) ×4 IMPLANT
SUTURE V-LC BRB 180 2/0GR6GS22 (SUTURE) ×6 IMPLANT
SYR BULB IRRIGATION 50ML (SYRINGE) IMPLANT
SYRINGE 10CC LL (SYRINGE) ×4 IMPLANT
SYS LAPSCP GELPORT 120MM (MISCELLANEOUS) ×4
SYSTEM LAPSCP GELPORT 120MM (MISCELLANEOUS) ×2 IMPLANT
TOWEL OR 17X26 10 PK STRL BLUE (TOWEL DISPOSABLE) ×8 IMPLANT
TOWEL OR NON WOVEN STRL DISP B (DISPOSABLE) ×8 IMPLANT
TRAY FOLEY CATH 14FRSI W/METER (CATHETERS) IMPLANT
TRAY FOLEY METER SIL LF 16FR (CATHETERS) ×4 IMPLANT
TROCAR BLADELESS OPT 5 100 (ENDOMECHANICALS) ×4 IMPLANT
TROCAR XCEL 12X100 BLDLESS (ENDOMECHANICALS) ×4 IMPLANT
TUBING CONNECTING 10 (TUBING) ×6 IMPLANT
TUBING CONNECTING 10' (TUBING) ×2
TUBING FILTER THERMOFLATOR (ELECTROSURGICAL) ×4 IMPLANT
TUNNELER SHEATH ON-Q 16GX12 DP (PAIN MANAGEMENT) IMPLANT
YANKAUER SUCT BULB TIP 10FT TU (MISCELLANEOUS) ×4 IMPLANT

## 2014-04-01 NOTE — Interval H&P Note (Signed)
History and Physical Interval Note:  04/01/2014 6:59 AM  Jorge Ross  has presented today for surgery, with the diagnosis of colon cancer  The various methods of treatment have been discussed with the patient and family. After consideration of risks, benefits and other options for treatment, the patient has consented to  Procedure(s): ROBOT ILO  COLECTOMY (N/A) ROBOTIC ASSISTED LAPAROSCOPIC LEFT PARTIAL NEPHRECTOMY (Left) as a surgical intervention .  The patient's history has been reviewed, patient examined, no change in status, stable for surgery.  I have reviewed the patient's chart and labs.  Questions were answered to the patient's satisfaction.     Aodhan Scheidt

## 2014-04-01 NOTE — Anesthesia Preprocedure Evaluation (Addendum)
Anesthesia Evaluation  Patient identified by MRN, date of birth, ID band Patient awake  General Assessment Comment: Hypertension     .  Arthritis         HIPS   .  Unspecified disorder of intestine     .  Cancer         kidney left     Reviewed: Allergy & Precautions, H&P , NPO status , Patient's Chart, lab work & pertinent test results  Airway Mallampati: II TM Distance: >3 FB Neck ROM: Full    Dental  (+) Chipped,    Pulmonary neg pulmonary ROS,  breath sounds clear to auscultation  Pulmonary exam normal       Cardiovascular Exercise Tolerance: Good hypertension, Pt. on medications Rhythm:Regular Rate:Normal  ECG: LBBB   Neuro/Psych negative neurological ROS  negative psych ROS   GI/Hepatic negative GI ROS, Neg liver ROS,   Endo/Other  negative endocrine ROS  Renal/GU negative Renal ROS  negative genitourinary   Musculoskeletal  (+) Arthritis -,   Abdominal   Peds negative pediatric ROS (+)  Hematology negative hematology ROS (+)   Anesthesia Other Findings   Reproductive/Obstetrics negative OB ROS                          Anesthesia Physical Anesthesia Plan  ASA: II  Anesthesia Plan: General   Post-op Pain Management:    Induction: Intravenous  Airway Management Planned: Oral ETT  Additional Equipment:   Intra-op Plan:   Post-operative Plan: Extubation in OR  Informed Consent: I have reviewed the patients History and Physical, chart, labs and discussed the procedure including the risks, benefits and alternatives for the proposed anesthesia with the patient or authorized representative who has indicated his/her understanding and acceptance.   Dental advisory given  Plan Discussed with: CRNA  Anesthesia Plan Comments:         Anesthesia Quick Evaluation

## 2014-04-01 NOTE — Op Note (Signed)
Jorge Ross, Jorge Ross NO.:  [id number]  MEDICAL RECORD NO.:  [id number]  LOCATION:  78                         FACILITY:  [facility]  PHYSICIAN:  Alexis Frock, MD     DATE OF BIRTH:  19-Feb-1941  DATE OF PROCEDURE: 04/01/2014 DATE OF DISCHARGE:                              OPERATIVE REPORT   DIAGNOSIS:  Enhancing left renal mass.  PROCEDURES: 1. Robotic-assisted laparoscopic left partial nephrectomy. 2. Partial colectomy as per General Surgery's operative note.  ASSISTANT:  Leta Baptist, PA.  ESTIMATED BLOOD LOSS:  100 mL.  COMPLICATIONS:  None.  SPECIMENS: 1. Fat around left renal mass. 2. Fat around left renal mass frozen section, negative for carcinoma. 3. Left partial nephrectomy.  FINDINGS: 1. Two artery, one vein, left renovascular anatomy. 2. Massive amount of retroperitoneal fat. 3. 16 minutes Warm Ischemia Time  DRAINS: 1. Jackson-Pratt drain to bulb suction. 2. Foley catheter to straight drain.  INDICATIONS:  Jorge Ross is a very pleasant 73 year old gentleman, who was found to have a large polypoid cecal mass recently by Gastroenterology.  He underwent evaluation by General Surgery including additional imaging with actual imaging, which revealed an enhancing left lateral renal mass as well as some bilateral simple renal cysts.  Given the constellation of findings, options were discussed with the patient including combined operative approach with robotic partial colectomy as well as robotic left partial nephrectomy, combined general surgery and urologic procedure and he wished to proceed.  Informed consent was obtained and placed in the medical record.  PROCEDURE IN DETAIL:  The patient had already undergone partial colectomy with anastomosis by the general surgeons with removal of that specimen and placement of several ports in the left hemiabdomen as well as a GelPort in the Pfannenstiel location.  An Charlie Pitter was placed across these  areas sterile.  The patient was then placed into a left side up full flank position applying 15 degrees stable flexion, superior arm elevator, axillary roll, sequential compression devices, bottom leg bent, top leg straight.  He has further fashioned on the operative table using 3-inch tape over foam padding.  The previous Charlie Pitter was removed and a new sterile field was created by prepping and draping the patient's entire left flank and abdomen using chlorhexidine gluconate, prepping all the previous incision sites and gel point into the new operative field.  Next, an 8-mm robotic camera port was placed into one of the prior lateral port sites.  Pneumoperitoneum was re-established. Additional ports were then placed as follows; an 8-mm left far lateral robotic port, left inferior paramedian robotic port, used one of the previous general surgery ports, 12-mm assistant port in the midline superiorly, which previously used one of the prior general surgery incision sites and the left subcostal 8-mm robotic port.  Robot was docked and passed through electronic checks.  Attention was then directed at development of the left retroperitoneum.  Incision was made lateral to the descending colon from the area of the splenic flexure towards the area of the internal ring.  The colon was carefully swept medially.  Attention was directed to retroperitoneum, which was consisted of a massive amount of retroperitoneal fat as anticipated. This muscle was  identified and dissection proceeded medial to this.  The uterine and gonadal were also found, placed on gentle lateral traction. Dissection proceeded along these as well as area of the renal hilum. Renal hilum consisted of a two-artery, single vein, left renovascular anatomy and the arteries were marked with vessel loops.  Dissection was then proceeded directly into the anterior surface of the kidney.  After this was found, the kidney was defatted in its  anterior and lateral aspects, this exposed the area worrisome for renal mass and this was quite obvious visibly.  Large amount of perinephric fat was set aside for permanent pathology.  Small amount of this did have some granular appearance worrisome for possibly locally advanced disease and this was set aside for frozen section, but found to be negative for carcinoma. The area was scored and felt that robotic partial nephrectomy could adequately managed this mass.  Mannitol was given intravenously.  Single bulldog clamp was placed on each artery.  The vein was not clamped and robotic partial nephrectomy was performed using cold scissors, keeping what appeared to be a rim of normal parenchyma with the mass in question.  Renorrhaphy was performed, first layer being running 3-0 V- Loc suture oversewing any obvious open vessels.  There were no incisions to collecting system visibly.  Secondary to V-Loc wire, was also used, resulted in excellent hemostatic control of the partial nephrectomy bed and the hilum was unclamped.  Next, a small Surgicel bolster and 5 mL of FloSeal were applied onto the partial nephrectomy area and a second parenchymal apposition layer was placed of 2-0 V-Loc, sandwiched between the Hem-O-Lok clips.  Following these maneuvers, hemostasis appeared excellent.  There appeared to be adequate parenchymal apposition. Perinephric fat was reapproximated over the partial nephrectomy area. Vessel loops were removed.  All sponge and needle counts were correct. Specimen was placed into an EndoCatch bag for later retrieval.  The robot was then undocked.  Closed suction drain was brought through the previous lateral most robotic port site in the area of the peritoneal cavity.  Previous 12-mm assistant port site was closed at the level of fascia using figure-of-eight Vicryl.  Specimen was retrieved by removing it via the previous small GelPort in the Pfannenstiel incision.   This area was then taken down.  The appendiceal incision was closed by reapproximating the muscle using a running Vicryl and reapproximated fascia using a figure-of-eight PDS x4 followed by subcutaneous tissue with running Vicryl.  All incision sites were infiltrated with dilute Marcaine and closed the level of the skin using subcuticular Monocryl followed by Dermabond.  Drain system was applied.  Procedure was then terminated.  The patient tolerated the procedure well.  There were no immediate periprocedural complications.  The patient was taken to the postanesthesia care unit in stable condition.           ______________________________ Alexis Frock, MD     TM/MEDQ  D:  04/01/2014  T:  04/01/2014  Job:  [id number]

## 2014-04-01 NOTE — H&P (View-Only) (Signed)
HISTORY: Pt is here in follow up regarding his cecal polyp.  He has many questions about surgery and we went through them.  His main concerns are about why so much needs to come out and what we would be doing.     PERTINENT REVIEW OF SYSTEMS: Otherwise negative x11.    Filed Vitals:   03/14/14 0840  BP: 126/76  Pulse: 73  Temp: 97.6 F (36.4 C)   Wt Readings from Last 3 Encounters:  03/14/14 180 lb (81.647 kg)  02/16/14 180 lb (81.647 kg)  02/15/14 181 lb (82.101 kg)    EXAM: Head: Normocephalic and atraumatic.  Eyes:  Conjunctivae are normal. Pupils are equal, round, and reactive to light. No scleral icterus.  Neck:  Normal range of motion. Neck supple. No tracheal deviation present. No thyromegaly present.  Resp: No respiratory distress, normal effort. Abd:  Abdomen is soft, non distended and non tender. No masses are palpable.  There is no rebound and no guarding.  Neurological: Alert and oriented to person, place, and time. Coordination normal.  Skin: Skin is warm and dry. No rash noted. No diaphoretic. No erythema. No pallor.  Psychiatric: Normal mood and affect. Normal behavior. Judgment and thought content normal.      ASSESSMENT AND PLAN:   Colonic mass (pathology pending) I recommend removing the right colon due to the polyp that is unresectable at colonoscopy.   He is scheduled for appt with Dr. Tresa Moore tomorrow to fully flesh out plan with kidney mass.   We will do this in the same OR setting.    We reviewed the surgery, and the risks and benefits.    He is set up for 9/4.    25 min spent answering questions in counseling about surgery.     Milus Height, MD Surgical Oncology, [facility]  [name], MD No ref. provider found  Maurice Small

## 2014-04-01 NOTE — Transfer of Care (Signed)
Immediate Anesthesia Transfer of Care Note  Patient: Jorge Ross  Procedure(s) Performed: Procedure(s) (LRB): ROBOTIC ASSISTED LAPAROSCOPIC RIGHT HEMI COLECTOMY (N/A) ROBOTIC ASSISTED LAPAROSCOPIC LEFT PARTIAL NEPHRECTOMY (Left)  Patient Location: PACU  Anesthesia Type: General  Level of Consciousness: sedated, patient cooperative and responds to stimulation  Airway & Oxygen Therapy: Patient Spontanous Breathing and Patient connected to face mask oxgen  Post-op Assessment: Report given to PACU RN and Post -op Vital signs reviewed and stable  Post vital signs: Reviewed and stable  Complications: No apparent anesthesia complications

## 2014-04-01 NOTE — H&P (Signed)
Jorge Ross is an 73 y.o. male.    Chief Complaint: Pre-Op Left Robotic Partial Nephrectomy at time of Partial Colectomy  HPI:   1 - Left Complex Renal Mass - 2.2 cm left mid lateral enhancing mass incidental on abd imaging 01/2014. Confirmed complex by f/u MRI. 2 artery / 1 vein left renovascular anatomy.  2 - Bilateral Simple Renal Cysts - Rt <1cm mid anterior, Lt 1.2cm mid anerior non-complex cysts also incidental on abd imaging 2015.  PMH sig for cecal mass (biopsy benign, but large with blood in stool), bilateral inguinal hernia repair, HTN. No CV disease. No strong blood thinners.   Today [name] is seen to proceed with left robotic partial nephrecotmy at same setting as colon resection in combined urology general surgery procedure.   Past Medical History  Diagnosis Date  . Hypertension   . Arthritis     HIPS  . Unspecified disorder of intestine   . Cancer     kidney left    Past Surgical History  Procedure Laterality Date  . Rotator cuff repair Left   . Cervical spine surgery      3 titamum screws  . Colonoscopy  2007    normal   . Thumb ligament surgery Right   . Hernia repair Bilateral     x 3 total     Family History  Problem Relation Age of Onset  . Colon cancer Neg Hx   . Rectal cancer Neg Hx   . Esophageal cancer Neg Hx   . Prostate cancer Paternal Uncle   . Lung cancer Paternal Uncle     smoker   Social History:  reports that he has never smoked. He has never used smokeless tobacco. He reports that he drinks alcohol. He reports that he does not use illicit drugs.  Allergies:  Allergies  Allergen Reactions  . Codeine     constipation    Medications Prior to Admission  Medication Sig Dispense Refill  . irbesartan-hydrochlorothiazide (AVALIDE) 150-12.5 MG per tablet Take 1 tablet by mouth every morning.       . Multiple Vitamin (MULTIVITAMIN) capsule Take 1 capsule by mouth daily.        . vitamin E 400 UNIT capsule Take 800 Units by mouth  daily.      Marland Kitchen aspirin 81 MG tablet Take 81 mg by mouth daily.        . naproxen sodium (ANAPROX) 220 MG tablet Take 220 mg by mouth 2 (two) times daily as needed (for pain).        No results found for this or any previous visit (from the past 48 hour(s)). No results found.  Review of Systems  Constitutional: Negative.  Negative for fever and chills.  HENT: Negative.   Eyes: Negative.   Respiratory: Negative.   Cardiovascular: Negative.   Gastrointestinal: Negative.   Genitourinary: Negative.   Musculoskeletal: Negative.   Skin: Negative.   Neurological: Negative.   Endo/Heme/Allergies: Negative.   Psychiatric/Behavioral: Negative.     Blood pressure 142/77, pulse 56, temperature 98.1 F (36.7 C), temperature source Oral, resp. rate 18, height 6' (1.829 m), weight 81.733 kg (180 lb 3 oz), SpO2 98.00%. Physical Exam  Constitutional: He appears well-developed and well-nourished.  HENT:  Head: Normocephalic.  Eyes: Pupils are equal, round, and reactive to light.  Neck: Normal range of motion. Neck supple.  Cardiovascular: Normal rate.   Respiratory: Effort normal.  Genitourinary:  No CVAT  Musculoskeletal: Normal range of motion.  Neurological: He is alert.  Skin: Skin is warm and dry.  Psychiatric: He has a normal mood and affect. His behavior is normal. Judgment and thought content normal.     Assessment/Plan   1 - Left Complex Renal Mass - worrisoem for localized renal cell carcinoma, discussed options of surveillance / partial neprectomy / radical nephrectomy / ablation in detail. He wants to proceed as planned with left robotic partial nephrectomy in combined Urol-General Surgery.    We rediscussed the role of partial nephrectomy with the overall goals being a balance of trying to achieve complete surgical excision (negative margins) while minimizing loss of normally functioning kidney. We then rediscussed surgical approaches including robotic and open techniques with  robotic associated with a shorter convalescence. I showed the patient on their abdomen the approximately 4-6 incision (trocar) sites as well as presumed extraction sites with robotic approach as well as possible open incision sites. We specifically readdressed that there may be need to alter operative plans according to intraopertive findings including conversion to open procedure or conversion to radical nephrectomy as well as need for adjunctive procedures such as ureteral stenting to promote correct renal healing. We rediscussed specific peri-operative risks including bleeding, infection, deep vein thrombosis, pulmonary embolism, compartment syndrome, neuropathy / neuropraxia, heart attack, stroke, death, as well as long-term risks such as non-cure / need for additional therapy and need for imaging and lab based post-op surveillance protocols. We rediscussed typical hospital course of approximately 2 day hospitalization, need for peri-operative drains / catheters, and typical post-hospital course with return to most non-strenuous activities by 2 weeks and ability to return to most jobs and more strenuous activity such as exercise by 6 weeks.     Finnean Cerami 04/01/2014, 6:32 AM

## 2014-04-01 NOTE — Brief Op Note (Signed)
04/01/2014  3:10 PM  PATIENT:  Jorge Ross  73 y.o. male  PRE-OPERATIVE DIAGNOSIS:  Left renal mass  POST-OPERATIVE DIAGNOSIS:  Left renal mass  PROCEDURE:  Left partial nephrectomy  SURGEON:  Surgeon(s) and Role: :    * Alexis Frock, MD - Primary  PHYSICIAN ASSISTANT:  Felipa Furnace, PA  ASSISTANTS: none   ANESTHESIA:   general  EBL:  Total I/O In: 4000 [I.V.:4000] Out: 250 [Urine:150; Blood:100]  BLOOD ADMINISTERED:none  DRAINS: JP, foley   LOCAL MEDICATIONS USED:  NONE  SPECIMEN:  Source of Specimen:  left partial nephrectomy  DISPOSITION OF SPECIMEN:  PATHOLOGY  COUNTS:  YES  TOURNIQUET:  * No tourniquets in log *  DICTATION: .Other Dictation: Dictation Number  [id number]  PLAN OF CARE: Admit to inpatient   PATIENT DISPOSITION:  PACU - hemodynamically stable.   Delay start of Pharmacological VTE agent (>24hrs) due to surgical blood loss or risk of bleeding: yes

## 2014-04-01 NOTE — Op Note (Signed)
Robotic Partial Colectomy (R hemicolectomy) Procedure Note   Indications: This patient presents for a laparoscopic right colectomy for unresectable polyp  Pre-operative Diagnosis: adenomatous polyp of cecum   Post-operative Diagnosis: Same  Surgeon: Stark Klein   Assistants: Leighton Ruff and Michael Boston  Anesthesia: General endotracheal anesthesia   ASA Class: 2  Procedure Details  The patient was seen in the Holding Room. The risks, benefits, complications, treatment options, and expected outcomes were discussed with the patient. The possibilities of reaction to medication, perforation of viscus, bleeding, recurrent infection, the need for additional procedures, failure to diagnose a condition, blood clot, heart or lung problems, and creating a complication requiring transfusion or operation were discussed with the patient. The patient was advised of the risk of ostomy. The patient concurred with the proposed plan, giving informed consent. The patient was taken to the operating room, identified, and the procedure verified as partial colectomy. A Time Out was held and the above information confirmed.   The patient was brought to the operating room and placed supine. After induction of a general anesthetic, a Foley catheter was inserted and the abdomen was prepped and draped in standard fashion. Local anesthetic was administered at the umbilicus and the veress needle was used to insufflate the abdomen.  An 8 mm robotic trocar was placed just left of the umbilicus.  The robotic endoscope was advanced into the abdomen.  An 8 mm robotic trocar was placed in the LLQ, a 12 mm robotic trocar was placed in the right lower quadrant, and an 8 mm robotic trocar was placed into the LUQ.  A 5 mm assistant port was placed into the LLQ.  All the positioning on the ports was checked.  The robot was docked.  The endoscope was inserted and targeted.  The additional instruments were placed into the  abdomen.    Exploration revealed a normal omentum, colon, small bowel, peritoneum, liver, and stomach.  The omentum was retracted toward the head, and the small bowel was placed toward the left.  The ileocolic pedicle was located and the vessel was skeletonized.  This was taken with the vessel sealer.  The terminal ileum was mobilized with the cold scissors.  The ureter and duodenum were located through the mesenteric window and retracted posteriorly in the avascular plane.  The ascending colon and hepatic flexure were then mobilized with gentle retraction of the colon in a medial direction with mobilization of the peritoneal reflection with cautery and the vessel sealer. Mobilization of this area was complete to expose the retroperitoneum. There was minimal blood loss during this portion of the procedure. The duodenum was avoided. The omentum was taken off the middle and proximal transverse colon with the vessel sealer. The mesentery was taken down up to a branch of the middle colic.  The ICG was injected by anesthesia and the line of demarcation of blood flow was identified in the proximal transverse colon.  The colon was transected robotically with the stapler 2 cm distal to the line of demarcation.    The colon and TI were lined up for a side-to-side, functional end to end anastamosis.  A small opening was made in the colon and the TI.  The robotic stapler was used to create the anastamosis.  The staple line was examined and was hemostatic.  The defect was closed with two 2-0 V-lok sutures in a running connell fashion.    The 12 mm trocar site in the RLQ was extended around 3 cm  medially, and a small pfannenstiel was created. The inner portion of the gel port was inserted.   The colon was extracted via this port.  The gel port was placed over the top.  A towel was placed over the gelport and Ioban was placed over all the ports.    Needle, sponge, and instrument counts were correct times 2.  The patient  was left in stable condition in the OR with Dr. Tresa Moore for robotic left nephrectomy.     Findings: palpable adenoma near IC valve.    Estimated Blood Loss: 50 mL  Specimens: R colon   Complications: None; patient tolerated the procedure well.   Disposition: PACU - hemodynamically stable.   Condition: stable

## 2014-04-02 DIAGNOSIS — N2889 Other specified disorders of kidney and ureter: Secondary | ICD-10-CM | POA: Diagnosis present

## 2014-04-02 LAB — MAGNESIUM: Magnesium: 1.9 mg/dL (ref 1.5–2.5)

## 2014-04-02 LAB — BASIC METABOLIC PANEL
Anion gap: 11 (ref 5–15)
BUN: 16 mg/dL (ref 6–23)
CALCIUM: 8.8 mg/dL (ref 8.4–10.5)
CO2: 26 meq/L (ref 19–32)
CREATININE: 0.99 mg/dL (ref 0.50–1.35)
Chloride: 101 mEq/L (ref 96–112)
GFR calc Af Amer: >90 mL/min (ref >90)
GFR, EST NON AFRICAN AMERICAN: 80 mL/min — AB (ref >90)
GLUCOSE: 218 mg/dL — AB (ref 70–99)
Potassium: 4.3 mEq/L (ref 3.7–5.3)
Sodium: 138 mEq/L (ref 137–147)

## 2014-04-02 LAB — CREATININE, FLUID (PLEURAL, PERITONEAL, JP DRAINAGE): CREAT FL: 1 mg/dL

## 2014-04-02 LAB — CBC
HCT: 36.2 % — ABNORMAL LOW (ref 39.0–52.0)
HEMOGLOBIN: 12.7 g/dL — AB (ref 13.0–17.0)
MCH: 32.5 pg (ref 26.0–34.0)
MCHC: 35.1 g/dL (ref 30.0–36.0)
MCV: 92.6 fL (ref 78.0–100.0)
Platelets: 201 10*3/uL (ref 150–400)
RBC: 3.91 MIL/uL — AB (ref 4.22–5.81)
RDW: 12.3 % (ref 11.5–15.5)
WBC: 8.9 10*3/uL (ref 4.0–10.5)

## 2014-04-02 LAB — PHOSPHORUS: Phosphorus: 2.9 mg/dL (ref 2.3–4.6)

## 2014-04-02 MED ORDER — DIPHENHYDRAMINE HCL 50 MG/ML IJ SOLN: 12.5000 mg | Freq: Four times a day (QID) | INTRAMUSCULAR | Status: DC | PRN

## 2014-04-02 MED ORDER — ALVIMOPAN 12 MG PO CAPS
12.0000 mg | ORAL_CAPSULE | Freq: Two times a day (BID) | ORAL | Status: DC
  Administered 2014-04-02 – 2014-04-03 (×2): 12 mg via ORAL
  Filled 2014-04-02 (×3): qty 1

## 2014-04-02 MED ORDER — MORPHINE SULFATE (PF) 1 MG/ML IV SOLN
INTRAVENOUS | Status: DC
  Administered 2014-04-03: 1 mg via INTRAVENOUS
  Administered 2014-04-03 (×2): 2 mg via INTRAVENOUS
  Administered 2014-04-03 (×2): 1 mg via INTRAVENOUS
  Administered 2014-04-03: 2 mg via INTRAVENOUS
  Administered 2014-04-03: 1 mg via INTRAVENOUS
  Administered 2014-04-04: 2 mg via INTRAVENOUS
  Administered 2014-04-04: 6 mg via INTRAVENOUS
  Administered 2014-04-04: 3 mg via INTRAVENOUS
  Administered 2014-04-04: 6 mg via INTRAVENOUS
  Administered 2014-04-04: 4 mg via INTRAVENOUS
  Administered 2014-04-04: 08:00:00 via INTRAVENOUS
  Administered 2014-04-05: 3 mg via INTRAVENOUS
  Administered 2014-04-05: 2 mg via INTRAVENOUS
  Filled 2014-04-02: qty 25

## 2014-04-02 MED ORDER — HEPARIN SODIUM (PORCINE) 5000 UNIT/ML IJ SOLN
5000.0000 [IU] | Freq: Three times a day (TID) | INTRAMUSCULAR | Status: DC
  Administered 2014-04-02 – 2014-04-06 (×11): 5000 [IU] via SUBCUTANEOUS
  Filled 2014-04-02 (×14): qty 1

## 2014-04-02 MED ORDER — CETYLPYRIDINIUM CHLORIDE 0.05 % MT LIQD
7.0000 mL | Freq: Two times a day (BID) | OROMUCOSAL | Status: DC
  Administered 2014-04-03 – 2014-04-07 (×4): 7 mL via OROMUCOSAL

## 2014-04-02 MED ORDER — ACETAMINOPHEN 325 MG PO TABS: 650.0000 mg | ORAL_TABLET | Freq: Four times a day (QID) | ORAL | Status: DC | PRN

## 2014-04-02 MED ORDER — ONDANSETRON HCL 4 MG PO TABS: 4.0000 mg | ORAL_TABLET | Freq: Four times a day (QID) | ORAL | Status: DC | PRN

## 2014-04-02 MED ORDER — KCL IN DEXTROSE-NACL 20-5-0.45 MEQ/L-%-% IV SOLN
INTRAVENOUS | Status: DC
  Administered 2014-04-02 – 2014-04-05 (×6): via INTRAVENOUS
  Filled 2014-04-02 (×7): qty 1000

## 2014-04-02 MED ORDER — DIPHENHYDRAMINE HCL 12.5 MG/5ML PO ELIX: 12.5000 mg | ORAL_SOLUTION | Freq: Four times a day (QID) | ORAL | Status: DC | PRN

## 2014-04-02 MED ORDER — CHLORHEXIDINE GLUCONATE 0.12 % MT SOLN
15.0000 mL | Freq: Two times a day (BID) | OROMUCOSAL | Status: DC
  Administered 2014-04-02 – 2014-04-08 (×11): 15 mL via OROMUCOSAL
  Filled 2014-04-02 (×14): qty 15

## 2014-04-02 MED ORDER — ONDANSETRON HCL 4 MG/2ML IJ SOLN
4.0000 mg | Freq: Four times a day (QID) | INTRAMUSCULAR | Status: DC | PRN
  Administered 2014-04-05: 4 mg via INTRAVENOUS
  Filled 2014-04-02: qty 2

## 2014-04-02 MED ORDER — IRBESARTAN 150 MG PO TABS
150.0000 mg | ORAL_TABLET | Freq: Every day | ORAL | Status: DC
  Administered 2014-04-03 – 2014-04-08 (×6): 150 mg via ORAL
  Filled 2014-04-02 (×7): qty 1

## 2014-04-02 MED ORDER — ALUM & MAG HYDROXIDE-SIMETH 200-200-20 MG/5ML PO SUSP: 30.0000 mL | Freq: Four times a day (QID) | ORAL | Status: DC | PRN

## 2014-04-02 MED ORDER — CHLORHEXIDINE GLUCONATE 0.12 % MT SOLN
15.0000 mL | Freq: Two times a day (BID) | OROMUCOSAL | Status: DC
  Administered 2014-04-02: 15 mL via OROMUCOSAL
  Filled 2014-04-02 (×3): qty 15

## 2014-04-02 MED ORDER — NALOXONE HCL 0.4 MG/ML IJ SOLN: 0.4000 mg | INTRAMUSCULAR | Status: DC | PRN

## 2014-04-02 MED ORDER — CETYLPYRIDINIUM CHLORIDE 0.05 % MT LIQD
7.0000 mL | Freq: Two times a day (BID) | OROMUCOSAL | Status: DC
  Administered 2014-04-02: 7 mL via OROMUCOSAL

## 2014-04-02 MED ORDER — HYDROCHLOROTHIAZIDE 12.5 MG PO CAPS
12.5000 mg | ORAL_CAPSULE | Freq: Every day | ORAL | Status: DC
  Administered 2014-04-03 – 2014-04-08 (×6): 12.5 mg via ORAL
  Filled 2014-04-02 (×7): qty 1

## 2014-04-02 MED ORDER — LIP MEDEX EX OINT
TOPICAL_OINTMENT | CUTANEOUS | Status: AC
  Administered 2014-04-02: 04:00:00
  Filled 2014-04-02: qty 7

## 2014-04-02 NOTE — Progress Notes (Signed)
1 Day Post-Op  Subjective: Comfortable.  No nausea.  Objective: Vital signs in last 24 hours: Temp:  [97.3 F (36.3 C)-98.4 F (36.9 C)] 97.5 F (36.4 C) (09/05 0600) Pulse Rate:  [77-88] 78 (09/05 0600) Resp:  [6-16] 15 (09/05 1212) BP: (124-176)/(58-91) 124/58 mmHg (09/05 0600) SpO2:  [96 %-100 %] 98 % (09/05 1212) Last BM Date: 03/31/14  Intake/Output from previous day: 09/04 0701 - 09/05 0700 In: 5650 [I.V.:5550; IV Piggyback:100] Out: 1310 [Urine:1125; Drains:85; Blood:100] Intake/Output this shift:    PE: General- In NAD Abdomen-soft, incisions clean, few bowel sounds  Lab Results:   Recent Labs  04/01/14 1907 04/02/14 0530  WBC 13.6* 8.9  HGB 14.9 12.7*  HCT 42.3 36.2*  PLT 215 201   BMET  Recent Labs  04/01/14 1634 04/02/14 0530  NA 134* 138  K 3.7 4.3  CL 95* 101  CO2 22 26  GLUCOSE 211* 218*  BUN 16 16  CREATININE 1.04 0.99  CALCIUM 8.8 8.8   PT/INR No results found for this basename: LABPROT, INR,  in the last 72 hours Comprehensive Metabolic Panel:    Component Value Date/Time   NA 138 04/02/2014 0530   NA 134* 04/01/2014 1634   K 4.3 04/02/2014 0530   K 3.7 04/01/2014 1634   CL 101 04/02/2014 0530   CL 95* 04/01/2014 1634   CO2 26 04/02/2014 0530   CO2 22 04/01/2014 1634   BUN 16 04/02/2014 0530   BUN 16 04/01/2014 1634   CREATININE 0.99 04/02/2014 0530   CREATININE 1.04 04/01/2014 1634   GLUCOSE 218* 04/02/2014 0530   GLUCOSE 211* 04/01/2014 1634   CALCIUM 8.8 04/02/2014 0530   CALCIUM 8.8 04/01/2014 1634   AST 24 02/15/2014 1121   AST 19 09/22/2008 0929   ALT 21 02/15/2014 1121   ALT 23 09/22/2008 0929   ALKPHOS 55 02/15/2014 1121   ALKPHOS 63 09/22/2008 0929   BILITOT 1.0 02/15/2014 1121   BILITOT 1.2 09/22/2008 0929   PROT 6.7 02/15/2014 1121   PROT 6.5 09/22/2008 0929   ALBUMIN 3.9 02/15/2014 1121   ALBUMIN 4.1 09/22/2008 0929     Studies/Results: No results found.  Anti-infectives: Anti-infectives   Start     Dose/Rate Route Frequency Ordered Stop    04/02/14 0000  cefoTEtan (CEFOTAN) 2 g in dextrose 5 % 50 mL IVPB     2 g 100 mL/hr over 30 Minutes Intravenous Every 12 hours 04/01/14 1809 04/02/14 0049   04/01/14 1215  cefoTEtan (CEFOTAN) 2 g in dextrose 5 % 50 mL IVPB     2 g 100 mL/hr over 30 Minutes Intravenous  Once 04/01/14 1157 04/01/14 1304   04/01/14 0609  ceFAZolin (ANCEF) IVPB 2 g/50 mL premix  Status:  Discontinued     2 g 100 mL/hr over 30 Minutes Intravenous 30 min pre-op 04/01/14 0609 04/01/14 1742   04/01/14 0609  cefoTEtan (CEFOTAN) 2 g in dextrose 5 % 50 mL IVPB     2 g 100 mL/hr over 30 Minutes Intravenous On call to O.R. 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 Active Problems:   Hypertension-BP normal on home meds   Left kidney mass s/p partial nephrectomy    LOS: 1 day   Plan: Clear liquids.  Mobilize.   Jorge Ross 04/02/2014

## 2014-04-02 NOTE — Progress Notes (Signed)
Patient ID: Jorge Ross, male   DOB: 1941-02-09, 73 y.o.   MRN: [id number]  Pt without complaints. In bed, but was up in chair earlier. Not ambulating.   PE: NAD Abd - soft, mild distended Ext - no calf pain or swelling  H/h 12, 36 Cr 0.99  A/P - POD#1 partial colectomy, Left partial Nx - -check JP for creatinine - if serum will d/c -d/c foley in AM

## 2014-04-03 LAB — BASIC METABOLIC PANEL
Anion gap: 9 (ref 5–15)
BUN: 10 mg/dL (ref 6–23)
CHLORIDE: 102 meq/L (ref 96–112)
CO2: 30 meq/L (ref 19–32)
Calcium: 8.8 mg/dL (ref 8.4–10.5)
Creatinine, Ser: 0.77 mg/dL (ref 0.50–1.35)
GFR calc Af Amer: >90 mL/min (ref >90)
GFR calc non Af Amer: 89 mL/min — ABNORMAL LOW (ref >90)
Glucose, Bld: 129 mg/dL — ABNORMAL HIGH (ref 70–99)
POTASSIUM: 4.2 meq/L (ref 3.7–5.3)
Sodium: 141 mEq/L (ref 137–147)

## 2014-04-03 LAB — CBC
HCT: 33.2 % — ABNORMAL LOW (ref 39.0–52.0)
Hemoglobin: 11.4 g/dL — ABNORMAL LOW (ref 13.0–17.0)
MCH: 32.5 pg (ref 26.0–34.0)
MCHC: 34.3 g/dL (ref 30.0–36.0)
MCV: 94.6 fL (ref 78.0–100.0)
PLATELETS: 154 10*3/uL (ref 150–400)
RBC: 3.51 MIL/uL — ABNORMAL LOW (ref 4.22–5.81)
RDW: 12.7 % (ref 11.5–15.5)
WBC: 8.9 10*3/uL (ref 4.0–10.5)

## 2014-04-03 NOTE — Progress Notes (Signed)
2 Days Post-Op  Subjective:  1 - Left Renal Mass - s/p robotic left partial nephrectomy 04/01/2014 in combined general surgery urology procedure. JP Cr same a serum 9/5 therefore JP and foley removed. Path pending.  Today Jorge Ross is w/o specific complaints. Tolerated some clears yesterday w/o nasuea. Ambulated some yesterday as well. No void since foley out this AM. No fevers.   Objective: Vital signs in last 24 hours: Temp:  [98 F (36.7 C)-98.3 F (36.8 C)] 98.3 F (36.8 C) (09/06 0530) Pulse Rate:  [57-78] 58 (09/06 0530) Resp:  [11-18] 16 (09/06 0530) BP: (110-141)/(57-77) 110/57 mmHg (09/06 0530) SpO2:  [97 %-99 %] 99 % (09/06 0530) Last BM Date: 03/31/14  Intake/Output from previous day: 09/05 0701 - 09/06 0700 In: 2686.7 [P.O.:600; I.V.:2086.7] Out: 2290 [Urine:2250; Drains:40] Intake/Output this shift:    General appearance: alert, cooperative, appears stated age and wife at bedside Ears: normal TM's and external ear canals both ears Nose: Nares normal. Septum midline. Mucosa normal. No drainage or sinus tenderness. Back: symmetric, no curvature. ROM normal. No CVA tenderness. Resp: non-labored Cardio: Nl rate GI: soft, non-tender; bowel sounds normal; no masses,  no organomegaly Male genitalia: normal Extremities: extremities normal, atraumatic, no cyanosis or edema Pulses: 2+ and symmetric Skin: Skin color, texture, turgor normal. No rashes or lesions Lymph nodes: Cervical, supraclavicular, and axillary nodes normal. Neurologic: Grossly normal Incision/Wound: Abd port sites and former JP site all c/d/i. No hernias.   Lab Results:   Recent Labs  04/02/14 0530 04/03/14 0619  WBC 8.9 8.9  HGB 12.7* 11.4*  HCT 36.2* 33.2*  PLT 201 154   BMET  Recent Labs  04/02/14 0530 04/03/14 0619  NA 138 141  K 4.3 4.2  CL 101 102  CO2 26 30  GLUCOSE 218* 129*  BUN 16 10  CREATININE 0.99 0.77  CALCIUM 8.8 8.8   PT/INR No results found for this basename: LABPROT,  INR,  in the last 72 hours ABG No results found for this basename: PHART, PCO2, PO2, HCO3,  in the last 72 hours  Studies/Results: No results found.  Anti-infectives: Anti-infectives   Start     Dose/Rate Route Frequency Ordered Stop   04/02/14 0000  cefoTEtan (CEFOTAN) 2 g in dextrose 5 % 50 mL IVPB     2 g 100 mL/hr over 30 Minutes Intravenous Every 12 hours 04/01/14 1809 04/02/14 0049   04/01/14 1215  cefoTEtan (CEFOTAN) 2 g in dextrose 5 % 50 mL IVPB     2 g 100 mL/hr over 30 Minutes Intravenous  Once 04/01/14 1157 04/01/14 1304   04/01/14 0609  ceFAZolin (ANCEF) IVPB 2 g/50 mL premix  Status:  Discontinued     2 g 100 mL/hr over 30 Minutes Intravenous 30 min pre-op 04/01/14 0609 04/01/14 1742   04/01/14 0609  cefoTEtan (CEFOTAN) 2 g in dextrose 5 % 50 mL IVPB     2 g 100 mL/hr over 30 Minutes Intravenous On call to O.R. 04/01/14 0609 04/01/14 0720      Assessment/Plan:  1 - Left Renal Mass - doing very well form GU perspective with unchanged serum Cr. Suitable for DC from GU perspective at anytime pending resolution of clinical ileus. His GU follow-up is arranged an on DC instructions.    [facility], Linzy Laury 04/03/2014

## 2014-04-03 NOTE — Progress Notes (Signed)
2 Days Post-Op  Subjective: No flatus or BM.  Tolerating clear liquids.   Objective: Vital signs in last 24 hours: Temp:  [98 F (36.7 C)-98.3 F (36.8 C)] 98.3 F (36.8 C) (09/06 0530) Pulse Rate:  [57-78] 58 (09/06 0530) Resp:  [11-18] 11 (09/06 0800) BP: (110-141)/(57-77) 110/57 mmHg (09/06 0530) SpO2:  [97 %-99 %] 98 % (09/06 0800) Last BM Date: 03/31/14  Intake/Output from previous day: 09/05 0701 - 09/06 0700 In: 2686.7 [P.O.:600; I.V.:2086.7] Out: 2290 [Urine:2250; Drains:40] Intake/Output this shift:    PE: General- In NAD Abdomen-soft, incisions clean, active bowel sounds  Lab Results:   Recent Labs  04/02/14 0530 04/03/14 0619  WBC 8.9 8.9  HGB 12.7* 11.4*  HCT 36.2* 33.2*  PLT 201 154   BMET  Recent Labs  04/02/14 0530 04/03/14 0619  NA 138 141  K 4.3 4.2  CL 101 102  CO2 26 30  GLUCOSE 218* 129*  BUN 16 10  CREATININE 0.99 0.77  CALCIUM 8.8 8.8   PT/INR No results found for this basename: LABPROT, INR,  in the last 72 hours Comprehensive Metabolic Panel:    Component Value Date/Time   NA 141 04/03/2014 0619   NA 138 04/02/2014 0530   K 4.2 04/03/2014 0619   K 4.3 04/02/2014 0530   CL 102 04/03/2014 0619   CL 101 04/02/2014 0530   CO2 30 04/03/2014 0619   CO2 26 04/02/2014 0530   BUN 10 04/03/2014 0619   BUN 16 04/02/2014 0530   CREATININE 0.77 04/03/2014 0619   CREATININE 0.99 04/02/2014 0530   GLUCOSE 129* 04/03/2014 0619   GLUCOSE 218* 04/02/2014 0530   CALCIUM 8.8 04/03/2014 0619   CALCIUM 8.8 04/02/2014 0530   AST 24 02/15/2014 1121   AST 19 09/22/2008 0929   ALT 21 02/15/2014 1121   ALT 23 09/22/2008 0929   ALKPHOS 55 02/15/2014 1121   ALKPHOS 63 09/22/2008 0929   BILITOT 1.0 02/15/2014 1121   BILITOT 1.2 09/22/2008 0929   PROT 6.7 02/15/2014 1121   PROT 6.5 09/22/2008 0929   ALBUMIN 3.9 02/15/2014 1121   ALBUMIN 4.1 09/22/2008 0929     Studies/Results: No results found.  Anti-infectives: Anti-infectives   Start     Dose/Rate Route Frequency Ordered  Stop   04/02/14 0000  cefoTEtan (CEFOTAN) 2 g in dextrose 5 % 50 mL IVPB     2 g 100 mL/hr over 30 Minutes Intravenous Every 12 hours 04/01/14 1809 04/02/14 0049   04/01/14 1215  cefoTEtan (CEFOTAN) 2 g in dextrose 5 % 50 mL IVPB     2 g 100 mL/hr over 30 Minutes Intravenous  Once 04/01/14 1157 04/01/14 1304   04/01/14 0609  ceFAZolin (ANCEF) IVPB 2 g/50 mL premix  Status:  Discontinued     2 g 100 mL/hr over 30 Minutes Intravenous 30 min pre-op 04/01/14 0609 04/01/14 1742   04/01/14 0609  cefoTEtan (CEFOTAN) 2 g in dextrose 5 % 50 mL IVPB     2 g 100 mL/hr over 30 Minutes Intravenous On call to O.R. 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 Active Problems:   Hypertension-BP well controlled on home meds   Left kidney mass s/p partial nephrectomy    LOS: 2 days   Plan: Full liquids.   Hargun Spurling J 04/03/2014

## 2014-04-04 LAB — BASIC METABOLIC PANEL
Anion gap: 8 (ref 5–15)
BUN: 8 mg/dL (ref 6–23)
CHLORIDE: 102 meq/L (ref 96–112)
CO2: 30 mEq/L (ref 19–32)
Calcium: 8.8 mg/dL (ref 8.4–10.5)
Creatinine, Ser: 0.74 mg/dL (ref 0.50–1.35)
GFR calc non Af Amer: >90 mL/min (ref >90)
Glucose, Bld: 110 mg/dL — ABNORMAL HIGH (ref 70–99)
Potassium: 4.2 mEq/L (ref 3.7–5.3)
Sodium: 140 mEq/L (ref 137–147)

## 2014-04-04 LAB — CBC
HEMATOCRIT: 32.4 % — AB (ref 39.0–52.0)
HEMOGLOBIN: 11.1 g/dL — AB (ref 13.0–17.0)
MCH: 32.5 pg (ref 26.0–34.0)
MCHC: 34.3 g/dL (ref 30.0–36.0)
MCV: 94.7 fL (ref 78.0–100.0)
Platelets: 142 10*3/uL — ABNORMAL LOW (ref 150–400)
RBC: 3.42 MIL/uL — ABNORMAL LOW (ref 4.22–5.81)
RDW: 12.5 % (ref 11.5–15.5)
WBC: 6.1 10*3/uL (ref 4.0–10.5)

## 2014-04-04 NOTE — Anesthesia Postprocedure Evaluation (Signed)
  Anesthesia Post-op Note  Patient: Jorge Ross  Procedure(s) Performed: Procedure(s) (LRB): ROBOTIC ASSISTED LAPAROSCOPIC RIGHT HEMI COLECTOMY (N/A) ROBOTIC ASSISTED LAPAROSCOPIC LEFT PARTIAL NEPHRECTOMY (Left)  Patient Location: PACU  Anesthesia Type: general  Level of Consciousness: awake and alert   Airway and Oxygen Therapy: Patient Spontanous Breathing  Post-op Pain: mild  Post-op Assessment: Post-op Vital signs reviewed, Patient's Cardiovascular Status Stable, Respiratory Function Stable, Patent Airway and No signs of Nausea or vomiting  Last Vitals:  Filed Vitals:   04/04/14 0737  BP:   Pulse:   Temp:   Resp: 11    Post-op Vital Signs: stable   Complications: No apparent anesthesia complications

## 2014-04-04 NOTE — Progress Notes (Signed)
3 Days Post-Op Subjective: Patient has no specific complaints. Still has some pain when he moves around. Urinary stream is a bit slow but he feels like he is emptying out well. He reports flatus and a bowel movement yesterday.  Objective: Vital signs in last 24 hours: Temp:  [97.4 F (36.3 C)-98.9 F (37.2 C)] 98.6 F (37 C) (09/07 0548) Pulse Rate:  [60-68] 60 (09/07 0548) Resp:  [11-18] 11 (09/07 0548) BP: (123-159)/(60-69) 125/60 mmHg (09/07 0548) SpO2:  [94 %-99 %] 96 % (09/07 0548)  Intake/Output from previous day: 09/06 0701 - 09/07 0700 In: 2818.3 [P.O.:720; I.V.:2098.3] Out: 2175 [Urine:2175] Intake/Output this shift:    Physical Exam:  Constitutional: Vital signs reviewed. WD WN in NAD   Eyes: PERRL, No scleral icterus.   Cardiovascular: RRR Pulmonary/Chest: Normal effort Abdominal: Soft. Appropriate tenderness. Incisions all seem to be healing well, no erythema or discharge. Bowel sounds present.    Lab Results:  Recent Labs  04/02/14 0530 04/03/14 0619 04/04/14 0532  HGB 12.7* 11.4* 11.1*  HCT 36.2* 33.2* 32.4*   BMET  Recent Labs  04/03/14 0619 04/04/14 0532  NA 141 140  K 4.2 4.2  CL 102 102  CO2 30 30  GLUCOSE 129* 110*  BUN 10 8  CREATININE 0.77 0.74  CALCIUM 8.8 8.8   No results found for this basename: LABPT, INR,  in the last 72 hours No results found for this basename: LABURIN,  in the last 72 hours No results found for this or any previous visit.  Studies/Results: No results found.  Assessment/Plan:   Postoperative day #3 left partial nephrectomy. Concurrent laparoscopic colectomy. He seems to be doing well. Bowel function is returning.    Diet advancement per general surgery. He seems to be doing well from his nephrectomy-okay to discharge whenever general surgery feels comfortable   LOS: 3 days   Franchot Gallo M 04/04/2014, 7:31 AM

## 2014-04-04 NOTE — Progress Notes (Signed)
3 Days Post-Op  Subjective: Had a BM.  A little nausea yesterday.  None this AM.   Objective: Vital signs in last 24 hours: Temp:  [97.4 F (36.3 C)-98.9 F (37.2 C)] 98.6 F (37 C) (09/07 0548) Pulse Rate:  [60-68] 60 (09/07 0548) Resp:  [11-18] 11 (09/07 0737) BP: (123-159)/(60-69) 125/60 mmHg (09/07 0548) SpO2:  [94 %-99 %] 98 % (09/07 0737) Last BM Date: 04/03/14  Intake/Output from previous day: 09/06 0701 - 09/07 0700 In: 2818.3 [P.O.:720; I.V.:2098.3] Out: 2175 [Urine:2175] Intake/Output this shift:    PE: General- In NAD Abdomen-soft, incisions clean and intact  Lab Results:   Recent Labs  04/03/14 0619 04/04/14 0532  WBC 8.9 6.1  HGB 11.4* 11.1*  HCT 33.2* 32.4*  PLT 154 142*   BMET  Recent Labs  04/03/14 0619 04/04/14 0532  NA 141 140  K 4.2 4.2  CL 102 102  CO2 30 30  GLUCOSE 129* 110*  BUN 10 8  CREATININE 0.77 0.74  CALCIUM 8.8 8.8   PT/INR No results found for this basename: LABPROT, INR,  in the last 72 hours Comprehensive Metabolic Panel:    Component Value Date/Time   NA 140 04/04/2014 0532   NA 141 04/03/2014 0619   K 4.2 04/04/2014 0532   K 4.2 04/03/2014 0619   CL 102 04/04/2014 0532   CL 102 04/03/2014 0619   CO2 30 04/04/2014 0532   CO2 30 04/03/2014 0619   BUN 8 04/04/2014 0532   BUN 10 04/03/2014 0619   CREATININE 0.74 04/04/2014 0532   CREATININE 0.77 04/03/2014 0619   GLUCOSE 110* 04/04/2014 0532   GLUCOSE 129* 04/03/2014 0619   CALCIUM 8.8 04/04/2014 0532   CALCIUM 8.8 04/03/2014 0619   AST 24 02/15/2014 1121   AST 19 09/22/2008 0929   ALT 21 02/15/2014 1121   ALT 23 09/22/2008 0929   ALKPHOS 55 02/15/2014 1121   ALKPHOS 63 09/22/2008 0929   BILITOT 1.0 02/15/2014 1121   BILITOT 1.2 09/22/2008 0929   PROT 6.7 02/15/2014 1121   PROT 6.5 09/22/2008 0929   ALBUMIN 3.9 02/15/2014 1121   ALBUMIN 4.1 09/22/2008 0929     Studies/Results: No results found.  Anti-infectives: Anti-infectives   Start     Dose/Rate Route Frequency Ordered Stop   04/02/14 0000  cefoTEtan (CEFOTAN) 2 g in dextrose 5 % 50 mL IVPB     2 g 100 mL/hr over 30 Minutes Intravenous Every 12 hours 04/01/14 1809 04/02/14 0049   04/01/14 1215  cefoTEtan (CEFOTAN) 2 g in dextrose 5 % 50 mL IVPB     2 g 100 mL/hr over 30 Minutes Intravenous  Once 04/01/14 1157 04/01/14 1304   04/01/14 0609  ceFAZolin (ANCEF) IVPB 2 g/50 mL premix  Status:  Discontinued     2 g 100 mL/hr over 30 Minutes Intravenous 30 min pre-op 04/01/14 0609 04/01/14 1742   04/01/14 0609  cefoTEtan (CEFOTAN) 2 g in dextrose 5 % 50 mL IVPB     2 g 100 mL/hr over 30 Minutes Intravenous On call to O.R. 04/01/14 9937 04/01/14 0720      Assessment Principal Problem:   Cecal adenoma s/p laparoscopic partial colectomy-bowel function starting to return. Active Problems:   Hypertension-BP well controlled on home meds   Left kidney mass s/p partial nephrectomy    LOS: 3 days   Plan: Advance to soft diet.  Decrease IVF.   Karee Christopherson J 04/04/2014

## 2014-04-05 ENCOUNTER — Encounter (HOSPITAL_COMMUNITY): Payer: Self-pay | Admitting: General Surgery

## 2014-04-05 LAB — BASIC METABOLIC PANEL
Anion gap: 10 (ref 5–15)
BUN: 9 mg/dL (ref 6–23)
CALCIUM: 9.2 mg/dL (ref 8.4–10.5)
CHLORIDE: 100 meq/L (ref 96–112)
CO2: 30 meq/L (ref 19–32)
Creatinine, Ser: 0.68 mg/dL (ref 0.50–1.35)
GFR calc Af Amer: >90 mL/min (ref >90)
GFR calc non Af Amer: >90 mL/min (ref >90)
GLUCOSE: 103 mg/dL — AB (ref 70–99)
Potassium: 4.1 mEq/L (ref 3.7–5.3)
SODIUM: 140 meq/L (ref 137–147)

## 2014-04-05 LAB — CBC
HCT: 33.8 % — ABNORMAL LOW (ref 39.0–52.0)
Hemoglobin: 11.6 g/dL — ABNORMAL LOW (ref 13.0–17.0)
MCH: 32.2 pg (ref 26.0–34.0)
MCHC: 34.3 g/dL (ref 30.0–36.0)
MCV: 93.9 fL (ref 78.0–100.0)
Platelets: 158 10*3/uL (ref 150–400)
RBC: 3.6 MIL/uL — AB (ref 4.22–5.81)
RDW: 12.1 % (ref 11.5–15.5)
WBC: 5.9 10*3/uL (ref 4.0–10.5)

## 2014-04-05 MED ORDER — OXYCODONE-ACETAMINOPHEN 5-325 MG PO TABS
1.0000 | ORAL_TABLET | ORAL | Status: DC | PRN
  Administered 2014-04-05: 2 via ORAL
  Administered 2014-04-06 – 2014-04-08 (×8): 1 via ORAL
  Filled 2014-04-05 (×7): qty 1
  Filled 2014-04-05: qty 2
  Filled 2014-04-05: qty 1

## 2014-04-05 MED ORDER — MORPHINE SULFATE 2 MG/ML IJ SOLN
1.0000 mg | INTRAMUSCULAR | Status: DC | PRN
  Administered 2014-04-05 – 2014-04-06 (×2): 2 mg via INTRAVENOUS
  Filled 2014-04-05 (×4): qty 1

## 2014-04-05 NOTE — Progress Notes (Signed)
Patient ID: Jorge Ross, male   DOB: May 01, 1941, 73 y.o.   MRN: [id number] 4 Days Post-Op  Subjective: + flatus and BM.  No nausea.  Pain tolerable.     Objective: Vital signs in last 24 hours: Temp:  [98.2 F (36.8 C)-98.5 F (36.9 C)] 98.5 F (36.9 C) (09/08 0600) Pulse Rate:  [63-73] 73 (09/08 0600) Resp:  [11-16] 13 (09/08 0748) BP: (113-135)/(60-71) 113/70 mmHg (09/08 0600) SpO2:  [95 %-99 %] 95 % (09/08 0748) Last BM Date: 04/03/14  Intake/Output from previous day: 09/07 0701 - 09/08 0700 In: 1198.8 [I.V.:1198.8] Out: 400 [Urine:400] Intake/Output this shift:    PE: General- In NAD Abdomen-soft, incisions clean, active bowel sounds  Lab Results:   Recent Labs  04/04/14 0532 04/05/14 0516  WBC 6.1 5.9  HGB 11.1* 11.6*  HCT 32.4* 33.8*  PLT 142* 158   BMET  Recent Labs  04/04/14 0532 04/05/14 0516  NA 140 140  K 4.2 4.1  CL 102 100  CO2 30 30  GLUCOSE 110* 103*  BUN 8 9  CREATININE 0.74 0.68  CALCIUM 8.8 9.2   PT/INR No results found for this basename: LABPROT, INR,  in the last 72 hours Comprehensive Metabolic Panel:    Component Value Date/Time   NA 140 04/05/2014 0516   NA 140 04/04/2014 0532   K 4.1 04/05/2014 0516   K 4.2 04/04/2014 0532   CL 100 04/05/2014 0516   CL 102 04/04/2014 0532   CO2 30 04/05/2014 0516   CO2 30 04/04/2014 0532   BUN 9 04/05/2014 0516   BUN 8 04/04/2014 0532   CREATININE 0.68 04/05/2014 0516   CREATININE 0.74 04/04/2014 0532   GLUCOSE 103* 04/05/2014 0516   GLUCOSE 110* 04/04/2014 0532   CALCIUM 9.2 04/05/2014 0516   CALCIUM 8.8 04/04/2014 0532   AST 24 02/15/2014 1121   AST 19 09/22/2008 0929   ALT 21 02/15/2014 1121   ALT 23 09/22/2008 0929   ALKPHOS 55 02/15/2014 1121   ALKPHOS 63 09/22/2008 0929   BILITOT 1.0 02/15/2014 1121   BILITOT 1.2 09/22/2008 0929   PROT 6.7 02/15/2014 1121   PROT 6.5 09/22/2008 0929   ALBUMIN 3.9 02/15/2014 1121   ALBUMIN 4.1 09/22/2008 0929     Studies/Results: No results  found.  Anti-infectives: Anti-infectives   Start     Dose/Rate Route Frequency Ordered Stop   04/02/14 0000  cefoTEtan (CEFOTAN) 2 g in dextrose 5 % 50 mL IVPB     2 g 100 mL/hr over 30 Minutes Intravenous Every 12 hours 04/01/14 1809 04/02/14 0049   04/01/14 1215  cefoTEtan (CEFOTAN) 2 g in dextrose 5 % 50 mL IVPB     2 g 100 mL/hr over 30 Minutes Intravenous  Once 04/01/14 1157 04/01/14 1304   04/01/14 0609  ceFAZolin (ANCEF) IVPB 2 g/50 mL premix  Status:  Discontinued     2 g 100 mL/hr over 30 Minutes Intravenous 30 min pre-op 04/01/14 0609 04/01/14 1742   04/01/14 0609  cefoTEtan (CEFOTAN) 2 g in dextrose 5 % 50 mL IVPB     2 g 100 mL/hr over 30 Minutes Intravenous On call to O.R. 04/01/14 [id number] 04/01/14 0720      Assessment Principal Problem:   Cecal adenoma s/p laparoscopic partial colectomy Active Problems:   Hypertension-BP well controlled on home meds   Left kidney mass s/p partial nephrectomy    LOS: 4 days   Plan: heart healthy diet. D/c PCA, IVF Case  management consult.   Possibly home tomorrow.     [facility] 04/05/2014

## 2014-04-05 NOTE — Care Management Note (Addendum)
    Page 1 of 2   04/08/2014     1:49:43 PM CARE MANAGEMENT NOTE 04/08/2014  Patient:  Jorge Ross, Jorge Ross   Account Number:  [id number]  Date Initiated:  04/05/2014  Documentation initiated by:  Sunday Spillers  Subjective/Objective Assessment:   73 yo male admitted s/p colectomy/nephrectomy. PTA lived at home with spouse.     Action/Plan:   Home when stable   Anticipated DC Date:  04/08/2014   Anticipated DC Plan:  [place]  CM consult      Choice offered to / List presented to:  C-1 Patient   DME arranged  [place]      DME agency  [place] arranged  HH-1 RN  [place].   Status of service:  Completed, signed off Medicare Important Message given?  YES (If response is "NO", the following Medicare IM given date fields will be blank) Date Medicare IM given:  04/06/2014 Medicare IM given by:  DAVIS,RHONDA Date Additional Medicare IM given:  04/08/2014 Additional Medicare IM given by:  Dessa Phi  Discharge Disposition:  [place]  Per UR Regulation:  Reviewed for med. necessity/level of care/duration of stay  If discussed at [place] of Stay Meetings, dates discussed:   04/07/2014    Comments:  04/08/14 Yianna Tersigni RN,BSN NCM [id number].[facility] DME REP AWARE OF ORDERS, & D/C TODAY.FAMILY WILL GET SHOWER CHAIR ON OWN.NO FURTHER D/C NEEDS.  04-05-14 [place] [phone] Spoke with patient and wife at bedside. Wife is concerned about her ability to care for patient after d/c as she has physical limitations. Discussed options for d/c including home with homehealth and SNF and what both would look like and provide. Patient spoke up and states he does not think that he needs assistance. He states he is not ready for d/c at this time.  Tried to explain to patient and spouse that he may achieve medical stability but may still have physical limitations. They would have to decide if that could be managed at home or at [facility]. Patient stated he will speak with the doctor again to discuss d/c timing and needs but did not want resources at this time. Left my card with contact information and told them to call me if they had further questions. Discussed conversation with the nurse.

## 2014-04-05 NOTE — Progress Notes (Signed)
4 Days Post-Op  Subjective:  1 - Left Renal Mass - s/p robotic left partial nephrectomy 04/01/2014 in combined general surgery urology procedure. JP Cr same a serum 9/5 therefore JP and foley removed. Path pending.  Today Jorge Ross is doing well. Ambulatory, tolerating solid foods with + flatus and + small BM.   Objective: Vital signs in last 24 hours: Temp:  [98 F (36.7 C)-98.5 F (36.9 C)] 98 F (36.7 C) (09/08 1407) Pulse Rate:  [67-73] 69 (09/08 1407) Resp:  [12-16] 16 (09/08 1407) BP: (113-131)/(70-79) 114/79 mmHg (09/08 1407) SpO2:  [95 %-100 %] 100 % (09/08 1407) Last BM Date: 04/03/14  Intake/Output from previous day: 09/07 0701 - 09/08 0700 In: 1198.8 [I.V.:1198.8] Out: 400 [Urine:400] Intake/Output this shift: Total I/O In: 580 [P.O.:580] Out: -   General appearance: alert, cooperative and appears stated age Nose: Nares normal. Septum midline. Mucosa normal. No drainage or sinus tenderness. Throat: lips, mucosa, and tongue normal; teeth and gums normal Neck: supple, symmetrical, trachea midline Back: symmetric, no curvature. ROM normal. No CVA tenderness. Resp: non-labored on room air Chest wall: no tenderness Cardio: Nl rate GI: soft, non-tender; bowel sounds normal; no masses,  no organomegaly Male genitalia: normal Extremities: extremities normal, atraumatic, no cyanosis or edema Pulses: 2+ and symmetric Lymph nodes: Cervical, supraclavicular, and axillary nodes normal. Neurologic: Grossly normal Incision/Wound: Prior port sites / extraction sites c/d/i. No hernias.   Lab Results:   Recent Labs  04/04/14 0532 04/05/14 0516  WBC 6.1 5.9  HGB 11.1* 11.6*  HCT 32.4* 33.8*  PLT 142* 158   BMET  Recent Labs  04/04/14 0532 04/05/14 0516  NA 140 140  K 4.2 4.1  CL 102 100  CO2 30 30  GLUCOSE 110* 103*  BUN 8 9  CREATININE 0.74 0.68  CALCIUM 8.8 9.2   PT/INR No results found for this basename: LABPROT, INR,  in the last 72 hours ABG No results  found for this basename: PHART, PCO2, PO2, HCO3,  in the last 72 hours  Studies/Results: No results found.  Anti-infectives: Anti-infectives   Start     Dose/Rate Route Frequency Ordered Stop   04/02/14 0000  cefoTEtan (CEFOTAN) 2 g in dextrose 5 % 50 mL IVPB     2 g 100 mL/hr over 30 Minutes Intravenous Every 12 hours 04/01/14 1809 04/02/14 0049   04/01/14 1215  cefoTEtan (CEFOTAN) 2 g in dextrose 5 % 50 mL IVPB     2 g 100 mL/hr over 30 Minutes Intravenous  Once 04/01/14 1157 04/01/14 1304   04/01/14 0609  ceFAZolin (ANCEF) IVPB 2 g/50 mL premix  Status:  Discontinued     2 g 100 mL/hr over 30 Minutes Intravenous 30 min pre-op 04/01/14 0609 04/01/14 1742   04/01/14 0609  cefoTEtan (CEFOTAN) 2 g in dextrose 5 % 50 mL IVPB     2 g 100 mL/hr over 30 Minutes Intravenous On call to O.R. 04/01/14 0609 04/01/14 0720      Assessment/Plan:  1 - Left Renal Mass -  Suitable for DC from GU perspective at anytime pending resolution of clinical ileus. His GU follow-up is arranged and on DC instructions.   Will discuss final path with pt when available by phone or in person if he is still in house.   [facility], Zoee Heeney 04/05/2014

## 2014-04-06 LAB — BASIC METABOLIC PANEL
ANION GAP: 9 (ref 5–15)
BUN: 13 mg/dL (ref 6–23)
CALCIUM: 9 mg/dL (ref 8.4–10.5)
CHLORIDE: 99 meq/L (ref 96–112)
CO2: 29 mEq/L (ref 19–32)
Creatinine, Ser: 0.76 mg/dL (ref 0.50–1.35)
GFR calc non Af Amer: 90 mL/min — ABNORMAL LOW (ref >90)
Glucose, Bld: 108 mg/dL — ABNORMAL HIGH (ref 70–99)
Potassium: 4.1 mEq/L (ref 3.7–5.3)
Sodium: 137 mEq/L (ref 137–147)

## 2014-04-06 LAB — CBC
HCT: 33.4 % — ABNORMAL LOW (ref 39.0–52.0)
Hemoglobin: 11.6 g/dL — ABNORMAL LOW (ref 13.0–17.0)
MCH: 32.4 pg (ref 26.0–34.0)
MCHC: 34.7 g/dL (ref 30.0–36.0)
MCV: 93.3 fL (ref 78.0–100.0)
Platelets: 192 10*3/uL (ref 150–400)
RBC: 3.58 MIL/uL — ABNORMAL LOW (ref 4.22–5.81)
RDW: 12 % (ref 11.5–15.5)
WBC: 5.5 10*3/uL (ref 4.0–10.5)

## 2014-04-06 LAB — OCCULT BLOOD X 1 CARD TO LAB, STOOL: Fecal Occult Bld: POSITIVE — AB

## 2014-04-06 NOTE — Progress Notes (Signed)
Patient ID: Jorge Ross, male   DOB: [birth year], 73 y.o.   MRN: [id number] 5 Days Post-Op  Subjective: Patient having some loose stools "with some red in it."  Also poor PO intake.  <1/3 of meals eaten yesterday.      Objective: Vital signs in last 24 hours: Temp:  [97.9 F (36.6 C)-98.1 F (36.7 C)] 97.9 F (36.6 C) (09/09 0600) Pulse Rate:  [60-69] 60 (09/09 0600) Resp:  [15-18] 16 (09/09 0600) BP: (114-116)/(56-79) 114/56 mmHg (09/09 0600) SpO2:  [95 %-100 %] 95 % (09/09 0600) Last BM Date: 04/03/14  Intake/Output from previous day: 09/08 0701 - 09/09 0700 In: 1060 [P.O.:1060] Out: -  Intake/Output this shift:    PE: General- In NAD Abdomen-soft, incisions clean, active bowel sounds  Lab Results:   Recent Labs  04/05/14 0516 04/06/14 0423  WBC 5.9 5.5  HGB 11.6* 11.6*  HCT 33.8* 33.4*  PLT 158 192   BMET  Recent Labs  04/05/14 0516 04/06/14 0423  NA 140 137  K 4.1 4.1  CL 100 99  CO2 30 29  GLUCOSE 103* 108*  BUN 9 13  CREATININE 0.68 0.76  CALCIUM 9.2 9.0   PT/INR No results found for this basename: LABPROT, INR,  in the last 72 hours Comprehensive Metabolic Panel:    Component Value Date/Time   NA 137 04/06/2014 0423   NA 140 04/05/2014 0516   K 4.1 04/06/2014 0423   K 4.1 04/05/2014 0516   CL 99 04/06/2014 0423   CL 100 04/05/2014 0516   CO2 29 04/06/2014 0423   CO2 30 04/05/2014 0516   BUN 13 04/06/2014 0423   BUN 9 04/05/2014 0516   CREATININE 0.76 04/06/2014 0423   CREATININE 0.68 04/05/2014 0516   GLUCOSE 108* 04/06/2014 0423   GLUCOSE 103* 04/05/2014 0516   CALCIUM 9.0 04/06/2014 0423   CALCIUM 9.2 04/05/2014 0516   AST 24 02/15/2014 1121   AST 19 09/22/2008 0929   ALT 21 02/15/2014 1121   ALT 23 09/22/2008 0929   ALKPHOS 55 02/15/2014 1121   ALKPHOS 63 09/22/2008 0929   BILITOT 1.0 02/15/2014 1121   BILITOT 1.2 09/22/2008 0929   PROT 6.7 02/15/2014 1121   PROT 6.5 09/22/2008 0929   ALBUMIN 3.9 02/15/2014 1121   ALBUMIN 4.1 09/22/2008 0929      Studies/Results: No results found.  Anti-infectives: Anti-infectives   Start     Dose/Rate Route Frequency Ordered Stop   04/02/14 0000  cefoTEtan (CEFOTAN) 2 g in dextrose 5 % 50 mL IVPB     2 g 100 mL/hr over 30 Minutes Intravenous Every 12 hours 04/01/14 1809 04/02/14 0049   04/01/14 1215  cefoTEtan (CEFOTAN) 2 g in dextrose 5 % 50 mL IVPB     2 g 100 mL/hr over 30 Minutes Intravenous  Once 04/01/14 1157 04/01/14 1304   04/01/14 0609  ceFAZolin (ANCEF) IVPB 2 g/50 mL premix  Status:  Discontinued     2 g 100 mL/hr over 30 Minutes Intravenous 30 min pre-op 04/01/14 0609 04/01/14 1742   04/01/14 0609  cefoTEtan (CEFOTAN) 2 g in dextrose 5 % 50 mL IVPB     2 g 100 mL/hr over 30 Minutes Intravenous On call to O.R. 04/01/14 9030 04/01/14 0720      Assessment Principal Problem:   Cecal adenoma s/p laparoscopic partial colectomy Active Problems:   Hypertension-BP well controlled on home meds   Left kidney mass s/p partial nephrectomy    LOS:  5 days   Change to regular diet. guiaic stools and d/c heparin Recheck CBC in AM.  Will not be ready for d/c today.    Russie Gulledge 04/06/2014

## 2014-04-06 NOTE — Progress Notes (Signed)
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 JP Cr same a serum 9/5 therefore JP and foley removed. Colon pathology T1N1.  Today Jorge Ross is without complaints. Discussed path with Dr. Barry Dienes too including implications of likely chemo. Tollerating diet, some BM's with small blood. NO nausea / emesis.   Objective: Vital signs in last 24 hours: Temp:  [97.9 F (36.6 C)-98.1 F (36.7 C)] 97.9 F (36.6 C) (09/09 0600) Pulse Rate:  [60-65] 60 (09/09 0600) Resp:  [16-18] 16 (09/09 0600) BP: (114-116)/(56-64) 114/56 mmHg (09/09 0600) SpO2:  [95 %-96 %] 95 % (09/09 0600) Last BM Date: 04/06/14  Intake/Output from previous day: 09/08 0701 - 09/09 0700 In: 1060 [P.O.:1060] Out: -  Intake/Output this shift: Total I/O In: 240 [P.O.:240] Out: 200 [Urine:200]  General appearance: alert, cooperative and appears stated age Head: Normocephalic, without obvious abnormality, atraumatic Throat: lips, mucosa, and tongue normal; teeth and gums normal Neck: supple, symmetrical, trachea midline Back: symmetric, no curvature. ROM normal. No CVA tenderness. Resp: Non labored on room air Cardio: Nl rate GI: soft, non-tender; bowel sounds normal; no masses,  no organomegaly Male genitalia: normal Extremities: extremities normal, atraumatic, no cyanosis or edema Pulses: 2+ and symmetric Skin: Skin color, texture, turgor normal. No rashes or lesions Lymph nodes: Cervical, supraclavicular, and axillary nodes normal. Neurologic: Grossly normal Incision/Wound: Recent port sites and extraction sites c/d/i. No hernias.  Lab Results:   Recent Labs  04/05/14 0516 04/06/14 0423  WBC 5.9 5.5  HGB 11.6* 11.6*  HCT 33.8* 33.4*  PLT 158 192   BMET  Recent Labs  04/05/14 0516 04/06/14 0423  NA 140 137  K 4.1 4.1  CL 100 99  CO2 30  29  GLUCOSE 103* 108*  BUN 9 13  CREATININE 0.68 0.76  CALCIUM 9.2 9.0   PT/INR No results found for this basename: LABPROT, INR,  in the last 72 hours ABG No results found for this basename: PHART, PCO2, PO2, HCO3,  in the last 72 hours  Studies/Results: No results found.  Anti-infectives: Anti-infectives   Start     Dose/Rate Route Frequency Ordered Stop   04/02/14 0000  cefoTEtan (CEFOTAN) 2 g in dextrose 5 % 50 mL IVPB     2 g 100 mL/hr over 30 Minutes Intravenous Every 12 hours 04/01/14 1809 04/02/14 0049   04/01/14 1215  cefoTEtan (CEFOTAN) 2 g in dextrose 5 % 50 mL IVPB     2 g 100 mL/hr over 30 Minutes Intravenous  Once 04/01/14 1157 04/01/14 1304   04/01/14 0609  ceFAZolin (ANCEF) IVPB 2 g/50 mL premix  Status:  Discontinued     2 g 100 mL/hr over 30 Minutes Intravenous 30 min pre-op 04/01/14 0609 04/01/14 1742   04/01/14 0609  cefoTEtan (CEFOTAN) 2 g in dextrose 5 % 50 mL IVPB     2 g 100 mL/hr over 30 Minutes Intravenous On call to O.R. 04/01/14 0609 04/01/14 0720      Assessment/Plan:  1 - Left Renal Mass - Discussed favorable kidney pathology and associated post-op surveillance.  Suitable for DC from GU perspective at anytime pending resolution of clinical ileus. His GU follow-up is arranged and on DC instructions.    [facility], Kemi Gell 04/06/2014

## 2014-04-07 NOTE — Progress Notes (Signed)
Patient ID: Jorge Ross, male   DOB: [birth year], 73 y.o.   MRN: [id number] 6 Days Post-Op  Subjective: PO intake improved.  Still had a reddish stool this AM and guiaic positive.  Also, pt is still unsteady.   Objective: Vital signs in last 24 hours: Temp:  [98.4 F (36.9 C)-98.5 F (36.9 C)] 98.5 F (36.9 C) (09/10 0600) Pulse Rate:  [64-83] 64 (09/10 0600) Resp:  [16] 16 (09/10 0600) BP: (109)/(58-63) 109/63 mmHg (09/10 0600) SpO2:  [94 %-96 %] 94 % (09/10 0600) Last BM Date: 04/06/14  Intake/Output from previous day: 09/09 0701 - 09/10 0700 In: 720 [P.O.:720] Out: 200 [Urine:200] Intake/Output this shift:    PE: General- In NAD Abdomen-soft, incisions clean, active bowel sounds  Lab Results:   Recent Labs  04/05/14 0516 04/06/14 0423  WBC 5.9 5.5  HGB 11.6* 11.6*  HCT 33.8* 33.4*  PLT 158 192   BMET  Recent Labs  04/05/14 0516 04/06/14 0423  NA 140 137  K 4.1 4.1  CL 100 99  CO2 30 29  GLUCOSE 103* 108*  BUN 9 13  CREATININE 0.68 0.76  CALCIUM 9.2 9.0   PT/INR No results found for this basename: LABPROT, INR,  in the last 72 hours Comprehensive Metabolic Panel:    Component Value Date/Time   NA 137 04/06/2014 0423   NA 140 04/05/2014 0516   K 4.1 04/06/2014 0423   K 4.1 04/05/2014 0516   CL 99 04/06/2014 0423   CL 100 04/05/2014 0516   CO2 29 04/06/2014 0423   CO2 30 04/05/2014 0516   BUN 13 04/06/2014 0423   BUN 9 04/05/2014 0516   CREATININE 0.76 04/06/2014 0423   CREATININE 0.68 04/05/2014 0516   GLUCOSE 108* 04/06/2014 0423   GLUCOSE 103* 04/05/2014 0516   CALCIUM 9.0 04/06/2014 0423   CALCIUM 9.2 04/05/2014 0516   AST 24 02/15/2014 1121   AST 19 09/22/2008 0929   ALT 21 02/15/2014 1121   ALT 23 09/22/2008 0929   ALKPHOS 55 02/15/2014 1121   ALKPHOS 63 09/22/2008 0929   BILITOT 1.0 02/15/2014 1121   BILITOT 1.2 09/22/2008 0929   PROT 6.7 02/15/2014 1121   PROT 6.5 09/22/2008 0929   ALBUMIN 3.9 02/15/2014 1121   ALBUMIN 4.1 09/22/2008 0929      Studies/Results: No results found.  Anti-infectives: Anti-infectives   Start     Dose/Rate Route Frequency Ordered Stop   04/02/14 0000  cefoTEtan (CEFOTAN) 2 g in dextrose 5 % 50 mL IVPB     2 g 100 mL/hr over 30 Minutes Intravenous Every 12 hours 04/01/14 1809 04/02/14 0049   04/01/14 1215  cefoTEtan (CEFOTAN) 2 g in dextrose 5 % 50 mL IVPB     2 g 100 mL/hr over 30 Minutes Intravenous  Once 04/01/14 1157 04/01/14 1304   04/01/14 0609  ceFAZolin (ANCEF) IVPB 2 g/50 mL premix  Status:  Discontinued     2 g 100 mL/hr over 30 Minutes Intravenous 30 min pre-op 04/01/14 0609 04/01/14 1742   04/01/14 0609  cefoTEtan (CEFOTAN) 2 g in dextrose 5 % 50 mL IVPB     2 g 100 mL/hr over 30 Minutes Intravenous On call to O.R. 04/01/14 5427 04/01/14 0720      Assessment Principal Problem:   Cecal adenoma s/p laparoscopic partial colectomy Active Problems:   Hypertension-BP well controlled on home meds   Left kidney mass s/p partial nephrectomy    LOS: 6 days  Regular diet Guiaic positive stools seem to have slowed down.  Would like them to stop.   Recheck CBC in AM.  Will not be ready for d/c today.    [facility] 04/07/2014

## 2014-04-07 NOTE — Progress Notes (Signed)
Had 1 bowel movement this morning , loose and maroon in color, moderate amount of BM.

## 2014-04-07 NOTE — Progress Notes (Signed)
CSW consulted for possible SNF placement. PN reviewed. CSW met with pt / spouse and spoke to pt's sister on cell. Pt plans to return home following hospital d/c. Pt would like [facility] services. PT/ OT consults would be helpful to determine d/c needs. Pt has contacted his insurance company and was told let he would qualify for home care services. CSW has left a message for [facility] for assistance with home health services. Awaiting return call. CSW will continue to follow until d/c plans are finalized.  Werner Lean LCSW [phone]

## 2014-04-07 NOTE — Progress Notes (Signed)
2nd bowel movement is still reddish, loose, moderate amount.

## 2014-04-07 NOTE — Progress Notes (Addendum)
[facility]  [facility] is providing the following services:  RW and [employer].   Spoke with patients wife re: tub seat w/ back.  I advised her that it is a private pay item and that I do not have anymore here at the hospital (gave out last one yesterday).  I gave her the cost of the tub seat and also [facility] retail address so that she could pick one up.  I also gave her an option of stopping by [place] and picking one up as well.  May be a little cheaper.  She was very appreciative and will work on getting one today.   If patient discharges after hours, please call [phone].   Linward Headland 04/07/2014, 10:29 AM

## 2014-04-08 LAB — CBC
HCT: 30.1 % — ABNORMAL LOW (ref 39.0–52.0)
Hemoglobin: 10.7 g/dL — ABNORMAL LOW (ref 13.0–17.0)
MCH: 32.8 pg (ref 26.0–34.0)
MCHC: 35.5 g/dL (ref 30.0–36.0)
MCV: 92.3 fL (ref 78.0–100.0)
PLATELETS: 197 10*3/uL (ref 150–400)
RBC: 3.26 MIL/uL — ABNORMAL LOW (ref 4.22–5.81)
RDW: 12.3 % (ref 11.5–15.5)
WBC: 6 10*3/uL (ref 4.0–10.5)

## 2014-04-08 MED ORDER — OXYCODONE-ACETAMINOPHEN 5-325 MG PO TABS: 1.0000 | ORAL_TABLET | ORAL | Status: DC | PRN

## 2014-04-08 NOTE — Progress Notes (Signed)
Patient ID: Jorge Ross, male   DOB: Oct 02, 1940, 73 y.o.   MRN: [id number] 7 Days Post-Op  Subjective: Stool this morning slightly more solid.  Only a few flecks of blood.  Cleared by PT.       Objective: Vital signs in last 24 hours: Temp:  [97.8 F (36.6 C)-99 F (37.2 C)] 98.7 F (37.1 C) (09/11 0437) Pulse Rate:  [63-81] 63 (09/11 0437) Resp:  [16] 16 (09/11 0437) BP: (104-142)/(53-56) 125/56 mmHg (09/11 0437) SpO2:  [97 %-100 %] 97 % (09/11 0437) Last BM Date: 04/07/14  Intake/Output from previous day:   Intake/Output this shift:    PE: General- In NAD Abdomen-soft, incisions clean, no erythema or drainage  Lab Results:   Recent Labs  04/06/14 0423 04/08/14 0429  WBC 5.5 6.0  HGB 11.6* 10.7*  HCT 33.4* 30.1*  PLT 192 197   BMET  Recent Labs  04/06/14 0423  NA 137  K 4.1  CL 99  CO2 29  GLUCOSE 108*  BUN 13  CREATININE 0.76  CALCIUM 9.0   PT/INR No results found for this basename: LABPROT, INR,  in the last 72 hours Comprehensive Metabolic Panel:    Component Value Date/Time   NA 137 04/06/2014 0423   NA 140 04/05/2014 0516   K 4.1 04/06/2014 0423   K 4.1 04/05/2014 0516   CL 99 04/06/2014 0423   CL 100 04/05/2014 0516   CO2 29 04/06/2014 0423   CO2 30 04/05/2014 0516   BUN 13 04/06/2014 0423   BUN 9 04/05/2014 0516   CREATININE 0.76 04/06/2014 0423   CREATININE 0.68 04/05/2014 0516   GLUCOSE 108* 04/06/2014 0423   GLUCOSE 103* 04/05/2014 0516   CALCIUM 9.0 04/06/2014 0423   CALCIUM 9.2 04/05/2014 0516   AST 24 02/15/2014 1121   AST 19 09/22/2008 0929   ALT 21 02/15/2014 1121   ALT 23 09/22/2008 0929   ALKPHOS 55 02/15/2014 1121   ALKPHOS 63 09/22/2008 0929   BILITOT 1.0 02/15/2014 1121   BILITOT 1.2 09/22/2008 0929   PROT 6.7 02/15/2014 1121   PROT 6.5 09/22/2008 0929   ALBUMIN 3.9 02/15/2014 1121   ALBUMIN 4.1 09/22/2008 0929     Studies/Results: No results found.  Anti-infectives: Anti-infectives   Start     Dose/Rate Route Frequency Ordered Stop   04/02/14  0000  cefoTEtan (CEFOTAN) 2 g in dextrose 5 % 50 mL IVPB     2 g 100 mL/hr over 30 Minutes Intravenous Every 12 hours 04/01/14 1809 04/02/14 0049   04/01/14 1215  cefoTEtan (CEFOTAN) 2 g in dextrose 5 % 50 mL IVPB     2 g 100 mL/hr over 30 Minutes Intravenous  Once 04/01/14 1157 04/01/14 1304   04/01/14 0609  ceFAZolin (ANCEF) IVPB 2 g/50 mL premix  Status:  Discontinued     2 g 100 mL/hr over 30 Minutes Intravenous 30 min pre-op 04/01/14 0609 04/01/14 1742   04/01/14 0609  cefoTEtan (CEFOTAN) 2 g in dextrose 5 % 50 mL IVPB     2 g 100 mL/hr over 30 Minutes Intravenous On call to O.R. 04/01/14 0609 04/01/14 0720      Assessment Principal Problem:   Cecal adenoma s/p laparoscopic partial colectomy Active Problems:   Hypertension-BP well controlled on home meds   Left kidney mass s/p partial nephrectomy        LOS: 7 days   Regular diet D/c home with [facility] PT/RN/NA    [facility] 04/08/2014

## 2014-04-08 NOTE — Discharge Instructions (Signed)
[facility]      [facility] °[phone] ° °ABDOMINAL SURGERY: POST OP INSTRUCTIONS ° °Always review your discharge instruction sheet given to you by the facility where your surgery was performed. ° °IF YOU HAVE DISABILITY OR FAMILY LEAVE FORMS, YOU MUST BRING THEM TO THE OFFICE FOR PROCESSING.  PLEASE DO NOT GIVE THEM TO YOUR DOCTOR. ° °1. A prescription for pain medication may be given to you upon discharge.  Take your pain medication as prescribed, if needed.  If narcotic pain medicine is not needed, then you may take acetaminophen (Tylenol) or ibuprofen (Advil) as needed. °2. Take your usually prescribed medications unless otherwise directed. °3. If you need a refill on your pain medication, please contact your pharmacy. They will contact our office to request authorization.  Prescriptions will not be filled after 5pm or on week-ends. °4. You should follow a light diet the first few days after arrival home, such as soup and crackers, pudding, etc.unless your doctor has advised otherwise. A high-fiber, low fat diet can be resumed as tolerated.   Be sure to include lots of fluids daily. Most patients will experience some swelling and bruising on the chest and neck area.  Ice packs will help.  Swelling and bruising can take several days to resolve °5. Most patients will experience some swelling and bruising in the area of the incision. Ice pack will help. Swelling and bruising can take several days to resolve..  °6. It is common to experience some constipation if taking pain medication after surgery.  Increasing fluid intake and taking a stool softener will usually help or prevent this problem from occurring.  A mild laxative (Milk of Magnesia or Miralax) should be taken according to package directions if there are no bowel movements after 48 hours. °7.  You may have steri-strips (small skin tapes) in place directly over the incision.  These strips should be left on the skin for 10-14 days.  If your  surgeon used skin glue on the incision, you may shower in 48 hours.  The glue will flake off over the next 2-3 weeks.  Any sutures or staples will be removed at the office during your follow-up visit. You may find that a light gauze bandage over your incision may keep your staples from being rubbed or pulled. You may shower and replace the bandage daily. °8. ACTIVITIES:  You may resume regular (light) daily activities beginning the next day--such as daily self-care, walking, climbing stairs--gradually increasing activities as tolerated.  You may have sexual intercourse when it is comfortable.  Refrain from any heavy lifting or straining until approved by your doctor. °a. You may drive when you no longer are taking prescription pain medication, you can comfortably wear a seatbelt, and you can safely maneuver your car and apply brakes °b. Return to Work: __________8 weeks if applicable_________________________ °9. You should see your doctor in the office for a follow-up appointment approximately two weeks after your surgery.  Make sure that you call for this appointment within a day or two after you arrive home to insure a convenient appointment time. °OTHER INSTRUCTIONS:  °_____________________________________________________________ °_____________________________________________________________ ° °WHEN TO CALL YOUR DOCTOR: °1. Fever over 101.0 °2. Inability to urinate °3. Nausea and/or vomiting °4. Extreme swelling or bruising °5. Continued bleeding from incision. °6. Increased pain, redness, or drainage from the incision. °7. Difficulty swallowing or breathing °8. Muscle cramping or spasms. °9. Numbness or tingling in hands or feet or around lips. ° °The clinic staff is   available to answer your questions during regular business hours.  Please don’t hesitate to call and ask to speak to one of the nurses if you have concerns. ° °For further questions, please visit [email or web address] ° ° ° °

## 2014-04-08 NOTE — Discharge Summary (Addendum)
Physician Discharge Summary  Patient ID: Jorge Ross MRN: [id number] DOB/AGE: 71/18/1942 71 y.o.  Admit date: 04/01/2014 Discharge date: 04/08/2014  Admission Diagnoses: Patient Active Problem List   Diagnosis Date Noted  . Left kidney mass 04/02/2014  . Cecal adenoma  02/16/2014  . Occult blood in stools 02/10/2014  . 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  He had expected ileus at first.  He did well post operatively other than being a bit weak.  His foley was removed after Dr. Tresa Moore checked the drain creatinine.  He was able to void independently.  He got a PT consult to assess his safely at home.  After his ileus resolved, he had several maroon stools.  His CBC was rechecked, and it was stable.  He also had no episodes of dizziness or hypotension.  He was kept several extra days to work with PT so he could go home, and so that the bloody stools could resolve.  He had very poor appetite at first, but by discharge, he was up to eating around 1/2 of his meals.  He was discharged to home in stable condition.    Consults: urology  Significant Diagnostic Studies: labs: HCT prior to d/c 30.3  Treatments: therapies: PT  Discharge Exam: Blood pressure 113/58, pulse 88, temperature 98.7 F (37.1 C), temperature source Oral, resp. rate 16, height 6' (1.829 m), weight 180 lb 3 oz (81.733 kg), SpO2 97.00%. General appearance: alert, cooperative and no distress Resp: breathing comfortably Cardio: regular rate and rhythm GI: soft, non distended.  wounds c/d/i.  No erythema.  Disposition: 01-Home or Self Care  Discharge Instructions   Call MD for:  difficulty  breathing, headache or visual disturbances    Complete by:  As directed      Call MD for:  persistant nausea and vomiting    Complete by:  As directed      Call MD for:  redness, tenderness, or signs of infection (pain, swelling, redness, odor or green/yellow discharge around incision site)    Complete by:  As directed      Call MD for:  severe uncontrolled pain    Complete by:  As directed      Call MD for:  temperature >100.4    Complete by:  As directed      Diet - low sodium heart healthy    Complete by:  As directed      Increase activity slowly    Complete by:  As directed             Medication List    STOP taking these medications       naproxen sodium 220 MG tablet  Commonly known as:  ANAPROX      TAKE these medications       aspirin 81 MG tablet  Take 81 mg by mouth daily.     irbesartan-hydrochlorothiazide 150-12.5 MG per tablet  Commonly known as:  AVALIDE  Take 1 tablet by mouth every morning.     multivitamin capsule  Take 1 capsule by mouth daily.     oxyCODONE-acetaminophen 5-325 MG per tablet  Commonly known as:  PERCOCET/ROXICET  Take 1-2 tablets by mouth every 4 (four)  hours as needed for moderate pain.     vitamin E 400 UNIT capsule  Take 800 Units by mouth daily.           Follow-up Information   Follow up with Alexis Frock, MD On 04/18/2014. (at 1:00)    Specialty:  Urology   Contact information:   [place] [address] [phone]       Follow up with [facility], MD In 2 weeks.   Specialty:  General Surgery   Contact information:   [address] [place] [address] [phone]       Follow up with [facility]. (home health agency will call you before coming out to your house to set up a time which is good for you.)    Contact information:   [address] [place] [address] [phone]       Signed: Stark Klein 04/08/2014, 10:59 AM

## 2014-04-08 NOTE — Evaluation (Signed)
Physical Therapy Evaluation Patient Details Name: Jorge Ross MRN: [id number] DOB: 12/10/1940 Today's Date: 04/08/2014   History of Present Illness  Pt is a 73 year old male with left renal mass s/p robotic left partial nephrectomy and cecal adenoma s/p laparoscopic partial colectomy 04/01/2014 .  Clinical Impression  Pt currently with functional limitations due to the deficits listed below (see PT Problem List).  Pt will benefit from skilled PT to increase their independence and safety with mobility to allow discharge to the venue listed below. Pt min/guard to supervision level for mobility and overall presents deconditioned and with generalized weakness.  Pt reports pain has improved each day since surgery and he anticipates d/c home possibly today.  Recommend RW and HHPT upon d/c.     Follow Up Recommendations Home health PT    Equipment Recommendations  Rolling walker with 5" wheels    Recommendations for Other Services       Precautions / Restrictions Precautions Precautions: Fall      Mobility  Bed Mobility Overal bed mobility: Modified Independent                Transfers Overall transfer level: Needs assistance Equipment used: Rolling walker (2 wheeled) Transfers: Sit to/from Stand Sit to Stand: Supervision         General transfer comment: verbal cues for hand placement  Ambulation/Gait Ambulation/Gait assistance: Min guard Ambulation Distance (Feet): 260 Feet Assistive device: Rolling walker (2 wheeled) Gait Pattern/deviations: Step-through pattern;Decreased stride length;Trunk flexed Gait velocity: decr   General Gait Details: verbal cues for RW distance and posture, pt reports fatigue after ambulating  Stairs            Wheelchair Mobility    Modified Rankin (Stroke Patients Only)       Balance                                             Pertinent Vitals/Pain Pain Assessment: 0-10 Pain Score: 4  Pain  Location: abdomen Pain Descriptors / Indicators: Sore Pain Intervention(s): Limited activity within patient's tolerance;Monitored during session;Repositioned    Home Living Family/patient expects to be discharged to:: Private residence Living Arrangements: Spouse/significant other   Type of Home: House Home Access: Stairs to enter Entrance Stairs-Rails: Left Entrance Stairs-Number of Steps: 3 Home Layout: One level Home Equipment: None      Prior Function Level of Independence: Independent               Hand Dominance        Extremity/Trunk Assessment               Lower Extremity Assessment: Generalized weakness         Communication   Communication: No difficulties  Cognition Arousal/Alertness: Awake/alert Behavior During Therapy: WFL for tasks assessed/performed Overall Cognitive Status: Within Functional Limits for tasks assessed                      General Comments      Exercises        Assessment/Plan    PT Assessment Patient needs continued PT services  PT Diagnosis Generalized weakness;Difficulty walking   PT Problem List Decreased strength;Decreased activity tolerance;Decreased mobility;Decreased knowledge of use of DME  PT Treatment Interventions DME instruction;Gait training;Stair training;Functional mobility training;Therapeutic activities;Therapeutic exercise;Patient/family education   PT Goals (Current goals can be  found in the Care Plan section) Acute Rehab PT Goals PT Goal Formulation: With patient Time For Goal Achievement: 04/15/14 Potential to Achieve Goals: Good    Frequency Min 3X/week   Barriers to discharge        Co-evaluation               End of Session   Activity Tolerance: Patient limited by fatigue Patient left: in chair;with call bell/phone within reach;with family/visitor present           Time: 1749-4496 PT Time Calculation (min): 17 min   Charges:   PT Evaluation $Initial PT  Evaluation Tier I: 1 Procedure PT Treatments $Gait Training: 8-22 mins   PT G Codes:          Anoop Hemmer,KATHrine E 04/08/2014, 9:37 AM Carmelia Bake, PT, DPT 04/08/2014 Pager: [phone]

## 2014-04-08 NOTE — Care Management (Addendum)
04/08/14 09:30 CM met with pt and wife in room to offer choice for home health agency.  [facility] will provide 3n1 and RW.  Family chooses [facility]  To render HHPT/aide/RN services.  Address and contact information verified with pt.  Referral made to [facility] rep, Juliann Pulse.  DME rep to deliver equipment prior to discharge.  No other CM needs were communicated.  Mariane Masters, BSN, CM [phone].

## 2014-04-25 ENCOUNTER — Telehealth: Payer: Self-pay | Admitting: Oncology

## 2014-04-25 NOTE — Telephone Encounter (Signed)
S/W PATIENT AND GAVE NP APPT FOR 10/06 @ 10:30 W/DR. SHADAD.  REFERRING DR. Cuthbert DX- [facility]

## 2014-04-26 ENCOUNTER — Telehealth: Payer: Self-pay | Admitting: Oncology

## 2014-04-26 NOTE — Telephone Encounter (Signed)
C/D 04/26/14 for appt. 05/03/14

## 2014-05-02 ENCOUNTER — Telehealth: Payer: Self-pay | Admitting: Medical Oncology

## 2014-05-02 NOTE — Telephone Encounter (Signed)
Courtesy call to patient reminding him of tomorrow's appts. Patient confirms and knows to bring current list of his medications, denies any questions at this time.

## 2014-05-03 ENCOUNTER — Encounter: Payer: Self-pay | Admitting: Oncology

## 2014-05-03 ENCOUNTER — Ambulatory Visit (HOSPITAL_BASED_OUTPATIENT_CLINIC_OR_DEPARTMENT_OTHER): Payer: Medicare Other

## 2014-05-03 ENCOUNTER — Other Ambulatory Visit: Payer: Medicare Other

## 2014-05-03 ENCOUNTER — Telehealth: Payer: Self-pay | Admitting: Oncology

## 2014-05-03 ENCOUNTER — Ambulatory Visit (HOSPITAL_BASED_OUTPATIENT_CLINIC_OR_DEPARTMENT_OTHER): Payer: Medicare Other | Admitting: Oncology

## 2014-05-03 VITALS — BP 126/59 | HR 84 | Temp 98.4°F | Resp 18 | Ht 72.0 in | Wt 179.1 lb

## 2014-05-03 DIAGNOSIS — C642 Malignant neoplasm of left kidney, except renal pelvis: Secondary | ICD-10-CM

## 2014-05-03 DIAGNOSIS — C180 Malignant neoplasm of cecum: Secondary | ICD-10-CM

## 2014-05-03 DIAGNOSIS — D126 Benign neoplasm of colon, unspecified: Secondary | ICD-10-CM

## 2014-05-03 NOTE — Consult Note (Signed)
Reason for Referral: Colon cancer and kidney cancer.   HPI: 73 year old pleasant gentleman currently of [place] where he lives majority of his life. He is a gentleman with very few comorbid conditions that includes hypertension and arthritis. He was in his usual state of health and asymptomatic when he was found to have blood in the stool by fecal occult testing. He was referred to  have a colonoscopy after a normal colonoscopy about 8 years ago. His repeat colonoscopy noted to have a cecal mass by Dr. Deatra Ina. He was evaluated by Dr. Barry Dienes for possible surgical resection and a CT scan of the abdomen and pelvis on 26/37/8588 revealed a complicated cyst within the left kidney and an MRI was recommended. An MRI of the abdomen obtained on 02/28/2014 showed a suspicious lesion in the mid pole of the left kidney. Based on these findings, patient underwent laparoscopic right colectomy as well as a left partial nephrectomy. This was on 04/01/2014 and the patient tolerated it well. 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 Tumor focally extends into the submucosa with negative margins. One out of 12 lymph nodes were involved with disease. The pathological staging was T1 N1a disease. 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 She reports that he is recovering at this time rather well but still requires some narcotic analgesics.   He did not report any headaches, blurry vision or syncope.he does not report any fevers, chills, sweats have very little weight loss at this time. His appetite has improved. He reports no chest pain, shortness of breath, palpitation or leg edema. He does not report any cough, wheezing or hemoptysis. He does not report any nausea, vomiting he does report some occasional abdominal pain and constipation. He is not report any frequency, urgency or hesitancy. Does  not report any skeletal complaints. He does not report any arthralgias or myalgias. He does not report any lymphadenopathy or petechiae. Rest of his review of systems unremarkable.   Past Medical History  Diagnosis Date  . Hypertension   . Arthritis     HIPS  . Unspecified disorder of intestine   . Cancer     kidney left  :  Past Surgical History  Procedure Laterality Date  . Rotator cuff repair Left   . Cervical spine surgery      3 titamum screws  . Colonoscopy  2007    normal   . Thumb ligament surgery Right   . Hernia repair Bilateral     x 3 total   . Robot assisted laparoscopic nephrectomy Left 04/01/2014    Procedure: ROBOTIC ASSISTED LAPAROSCOPIC LEFT PARTIAL NEPHRECTOMY;  Surgeon: Alexis Frock, MD;  Location: [facility];  Service: Urology;  Laterality: Left;  :   Current Outpatient Prescriptions  Medication Sig Dispense Refill  . acetaminophen (TYLENOL) 500 MG tablet Take 500 mg by mouth every 6 (six) hours as needed.      Marland Kitchen aspirin 81 MG tablet Take 81 mg by mouth daily.        . irbesartan-hydrochlorothiazide (AVALIDE) 150-12.5 MG per tablet Take 1 tablet by mouth every morning.       . Multiple Vitamin (MULTIVITAMIN) capsule Take 1 capsule by mouth daily.        Marland Kitchen oxyCODONE-acetaminophen (PERCOCET/ROXICET) 5-325 MG per tablet Take 1-2 tablets by mouth every 4 (four) hours as needed for moderate pain.  40 tablet  0  .  vitamin E 400 UNIT capsule Take 800 Units by mouth daily.       No current facility-administered medications for this visit.      Allergies  Allergen Reactions  . Codeine     constipation  :  Family History  Problem Relation Age of Onset  . Colon cancer Neg Hx   . Rectal cancer Neg Hx   . Esophageal cancer Neg Hx   . Prostate cancer Paternal Uncle   . Lung cancer Paternal Uncle     smoker  :  History   Social History  . Marital Status: Married    Spouse Name: N/A    Number of Children: 1  . Years of Education: N/A   Occupational  History  . 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 Smoking status: Never Smoker   . Smokeless tobacco: Never Used  . Alcohol Use: Yes     Comment: occ.  . Drug Use: No  . Sexual Activity: Not on file   Other Topics Concern  . Not on file   Social History Narrative  . No narrative on file  :  Pertinent items are noted in HPI.  Exam: ECOG 1 Blood pressure 126/59, pulse 84, temperature 98.4 F (36.9 C), temperature source Oral, resp. rate 18, height 6' (1.829 m), weight 179 lb 1.6 oz (81.239 kg). General appearance: alert and cooperative Head: Normocephalic, without obvious abnormality Throat: lips, mucosa, and tongue normal; teeth and gums normal Neck: no adenopathy Back: negative Resp: clear to auscultation bilaterally Chest wall: no tenderness Cardio: regular rate and rhythm, S1, S2 normal, no murmur, click, rub or gallop GI: soft, non-tender; bowel sounds normal; no masses,  no organomegaly Extremities: extremities normal, atraumatic, no cyanosis or edema Pulses: 2+ and symmetric Skin: Skin color, texture, turgor normal. No rashes or lesions Lymph nodes: Cervical, supraclavicular, and axillary nodes normal.   Assessment and Plan:    73 year old gentleman with the following issues:  1. 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 He is status post right colectomy done laparoscopically on 04/01/2014. The pathology revealed a T1 N1 disease with one out of 12 lymph nodes involved. The ventricles of this disease was discussed extensively with Mr. Codispoti and his wife. Options of treatment were discussed today which include observation, adjuvant chemotherapy in the form of FOLFOX or Xeloda based adjuvant therapy. Risks and benefits of all these approaches were discussed. I feel he is a high risk of disease relapse given his lymph node involvement. The risks can be as high as 30% in some  cases. I think is a reasonable candidate for adjuvant therapy and I discussed with him the rationale of using FOLFOX versus Xeloda. Complications associated with both regimens were discussed. Complications associated with FOLFOX include nausea, vomiting, myelosuppression, neutropenia, peripheral neuropathy and cold sensitivity. I have explained to him that he will require a Port-A-Cath insertion for an infusional form of 5-FU. Using Xeloda based chemotherapy is an option. I believe Xeloda as a single agent is rather inferior then Xeloda with Oxalliplatin but can spare him a Port-A-Cath insertion. After discussing all the risks and benefits of all these approaches he would like to think about his decision and let me know in the immediate future. I anticipate starting therapy in the first week of November.  If he elected to proceed with FOLFOX adjuvant chemotherapy, it will be given every 2 weeks for  a total of 6 months after the Port-A-Cath insertion. We will ask Dr. Barry Dienes to kindly to elapse before the start of anticipated chemotherapy on November of 2015.  2. 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 Pathological staging is T1 N0. No adjuvant therapy is required at this time but active surveillance will be needed regarding this tumor.  All questions were answered today he will let me know about his decision in the near future.

## 2014-05-03 NOTE — Telephone Encounter (Signed)
gv adn printed appt sched and avs for pt for NOV °

## 2014-05-03 NOTE — Progress Notes (Signed)
Checked in new patient with no financial issues prior to seeing the dr. He has appt card and has not been out of the country. °

## 2014-05-03 NOTE — Progress Notes (Signed)
Please see consult note.  

## 2014-05-10 NOTE — Progress Notes (Signed)
Patient ID: Jorge Ross, male   DOB: May 07, 1941, 73 y.o.   MRN: [id number]   As requested for documentation:  the pt's renal mass is a second primary cancer with pathology revealing pT1aNxMx papillary type 2 renal cell carcinoma with negative margins.

## 2014-05-11 ENCOUNTER — Telehealth: Payer: Self-pay | Admitting: Medical Oncology

## 2014-05-11 ENCOUNTER — Other Ambulatory Visit: Payer: Self-pay | Admitting: Oncology

## 2014-05-11 DIAGNOSIS — C189 Malignant neoplasm of colon, unspecified: Secondary | ICD-10-CM | POA: Insufficient documentation

## 2014-05-11 NOTE — Telephone Encounter (Signed)
Patient called stating that he has read over the information that Dr. Alen Blew has given him and he has decided he wanted to "go with the IV chemotherapy instead of the pill."   Patient asking about whether he needs a PAC? Also asking when treatment would be starting?  Informed patient that he has an appt coming up 11/03 and more than likely Dr. Alen Blew will discuss all this with him at this appt. Informed patient will review his questions with MD and should MD have any instructions or new information I will call him back and give him specifics.   Mssg routed to MD for review.

## 2014-05-11 NOTE — Telephone Encounter (Signed)
I will ask Dr. Barry Dienes to place a port. I anticipate stating FOLFOX chemotherapy  05/2014.

## 2014-05-12 ENCOUNTER — Other Ambulatory Visit: Payer: Self-pay | Admitting: Medical Oncology

## 2014-05-12 ENCOUNTER — Telehealth: Payer: Self-pay | Admitting: Oncology

## 2014-05-12 ENCOUNTER — Telehealth: Payer: Self-pay | Admitting: *Deleted

## 2014-05-12 MED ORDER — ONDANSETRON HCL 8 MG PO TABS: 8.0000 mg | ORAL_TABLET | Freq: Three times a day (TID) | ORAL | Status: DC | PRN

## 2014-05-12 MED ORDER — LIDOCAINE-PRILOCAINE 2.5-2.5 % EX CREA
1.0000 | TOPICAL_CREAM | CUTANEOUS | Status: DC | PRN
Start: 2014-05-12 — End: 2014-05-17

## 2014-05-12 NOTE — Telephone Encounter (Signed)
Per MD, informed patient of Dr. Marlowe Aschoff office to place port. Patient informed of appt for chemo education class and sched appts for lab/MD on 11/03.   Also informed patient of antiemtics and emla cream prescriptions, for the start of tx,  to be sent to his pharmacy. Answered patient's questions and encouraged patient to contact office with any other questions or concerns. Patient knows to expect call from Dr. Marlowe Aschoff office for appt for port placement.

## 2014-05-12 NOTE — Telephone Encounter (Signed)
Per staff message and POF I have scheduled appts. Advised scheduler of appts. JMW  

## 2014-05-12 NOTE — Telephone Encounter (Signed)
lvm for pt regarding all appts.

## 2014-05-17 ENCOUNTER — Other Ambulatory Visit: Payer: Medicare Other

## 2014-05-17 ENCOUNTER — Encounter (HOSPITAL_COMMUNITY): Payer: Self-pay | Admitting: Pharmacy Technician

## 2014-05-19 ENCOUNTER — Other Ambulatory Visit (INDEPENDENT_AMBULATORY_CARE_PROVIDER_SITE_OTHER): Payer: Self-pay | Admitting: General Surgery

## 2014-05-23 ENCOUNTER — Encounter (HOSPITAL_COMMUNITY): Payer: Self-pay

## 2014-05-23 ENCOUNTER — Encounter (HOSPITAL_COMMUNITY)
Admission: RE | Admit: 2014-05-23 | Discharge: 2014-05-23 | Disposition: A | Discharge status: Home / Self Care | Payer: Medicare Other | Source: Ambulatory Visit | Attending: General Surgery | Admitting: General Surgery

## 2014-05-23 DIAGNOSIS — M1990 Unspecified osteoarthritis, unspecified site: Secondary | ICD-10-CM | POA: Diagnosis not present

## 2014-05-23 DIAGNOSIS — Z9049 Acquired absence of other specified parts of digestive tract: Secondary | ICD-10-CM | POA: Diagnosis not present

## 2014-05-23 DIAGNOSIS — I10 Essential (primary) hypertension: Secondary | ICD-10-CM | POA: Diagnosis not present

## 2014-05-23 DIAGNOSIS — C182 Malignant neoplasm of ascending colon: Secondary | ICD-10-CM | POA: Diagnosis not present

## 2014-05-23 DIAGNOSIS — K219 Gastro-esophageal reflux disease without esophagitis: Secondary | ICD-10-CM | POA: Diagnosis not present

## 2014-05-23 HISTORY — DX: Chronic kidney disease, unspecified: N18.9

## 2014-05-23 LAB — BASIC METABOLIC PANEL
ANION GAP: 11 (ref 5–15)
BUN: 17 mg/dL (ref 6–23)
CO2: 26 mEq/L (ref 19–32)
CREATININE: 0.78 mg/dL (ref 0.50–1.35)
Calcium: 9.4 mg/dL (ref 8.4–10.5)
Chloride: 104 mEq/L (ref 96–112)
GFR, EST NON AFRICAN AMERICAN: 89 mL/min — AB (ref >90)
Glucose, Bld: 96 mg/dL (ref 70–99)
Potassium: 4.1 mEq/L (ref 3.7–5.3)
Sodium: 141 mEq/L (ref 137–147)

## 2014-05-23 LAB — CBC
HCT: 34 % — ABNORMAL LOW (ref 39.0–52.0)
Hemoglobin: 11.4 g/dL — ABNORMAL LOW (ref 13.0–17.0)
MCH: 32 pg (ref 26.0–34.0)
MCHC: 33.5 g/dL (ref 30.0–36.0)
MCV: 95.5 fL (ref 78.0–100.0)
Platelets: 242 10*3/uL (ref 150–400)
RBC: 3.56 MIL/uL — ABNORMAL LOW (ref 4.22–5.81)
RDW: 13.1 % (ref 11.5–15.5)
WBC: 4.7 10*3/uL (ref 4.0–10.5)

## 2014-05-23 MED ORDER — CEFAZOLIN SODIUM-DEXTROSE 2-3 GM-% IV SOLR
2.0000 g | INTRAVENOUS | Status: AC
  Administered 2014-05-24: 2 g via INTRAVENOUS
  Filled 2014-05-23: qty 50

## 2014-05-23 NOTE — H&P (Signed)
Location: [facility] Patient #: [id number] DOB: 11/15/1940 Married / Language: English / Race: Black or African American Male  HPI Patient words: f/u colon sx.  The patient is a 73 year old male who presents for a follow-up for colorectal cancer. The patient is being seen for follow up for Stage IIIA ( T1 and N1 ) moderately differentiated and adenocarcinoma of the cecum.. Initial presentation was for fecal occult blood.. Current diagnosis was determined by colonoscopy (was a large polyp on colonoscopy. not resectable on scope. referred for surgery.).Marland Kitchen Treatment has included colon resection (Robotic colectomy 04/01/2014).Marland Kitchen He also had a robotic partial left nephrectomy for a renal cell cancer by Dr. Tresa Moore on the same day.   Other Problems Arthritis Back Pain Cancer Colon Cancer Gastroesophageal Reflux Disease High blood pressure  Past Surgical History  Colon Polyp Removal - Colonoscopy Colon Polyp Removal - Open Colon Removal - Partial Nephrectomy Left. Open Inguinal Hernia Surgery Bilateral. Shoulder Surgery Left. Spinal Surgery - Neck  Diagnostic Studies History  Colonoscopy within last year  Allergies  Codeine/Codeine Derivatives constipation  Medication History Avalide (150-12.5MG  Tablet, Oral) Active. Vitamin E Complex (Oral) Specific dose unknown - Active. Multiple Vitamins Essential (Oral) Active. Medications Reconciled  Social History Alcohol use Occasional alcohol use. No caffeine use No drug use Tobacco use Never smoker.  Family History  Arthritis Father, Sister. Breast Cancer Sister. Diabetes Mellitus Sister. Hypertension Sister. Malignant Neoplasm Of Pancreas Brother.  Review of Systems  General Not Present- Appetite Loss, Chills, Fatigue, Fever, Night Sweats, Weight Gain and Weight Loss. Skin Not Present- Change in Wart/Mole, Dryness, Hives, Jaundice, New Lesions, Non-Healing Wounds, Rash and Ulcer. HEENT  Present- Wears glasses/contact lenses. Not Present- Earache, Hearing Loss, Hoarseness, Nose Bleed, Oral Ulcers, Ringing in the Ears, Seasonal Allergies, Sinus Pain, Sore Throat, Visual Disturbances and Yellow Eyes. Respiratory Not Present- Bloody sputum, Chronic Cough, Difficulty Breathing, Snoring and Wheezing. Breast Not Present- Breast Mass, Breast Pain, Nipple Discharge and Skin Changes. Cardiovascular Not Present- Chest Pain, Difficulty Breathing Lying Down, Leg Cramps, Palpitations, Rapid Heart Rate, Shortness of Breath and Swelling of Extremities. Gastrointestinal Present- Abdominal Pain, Change in Bowel Habits, Excessive gas and Rectal Pain. Not Present- Bloating, Bloody Stool, Chronic diarrhea, Constipation, Difficulty Swallowing, Gets full quickly at meals, Hemorrhoids, Indigestion, Nausea and Vomiting. Male Genitourinary Not Present- Blood in Urine, Change in Urinary Stream, Frequency, Impotence, Nocturia, Painful Urination, Urgency and Urine Leakage. Musculoskeletal Present- Back Pain and Muscle Weakness. Not Present- Joint Pain, Joint Stiffness, Muscle Pain and Swelling of Extremities. Psychiatric Present- Change in Sleep Pattern. Not Present- Anxiety, Bipolar, Depression, Fearful and Frequent crying. Endocrine Not Present- Cold Intolerance, Excessive Hunger, Hair Changes, Heat Intolerance, Hot flashes and New Diabetes. Hematology Not Present- Easy Bruising, Excessive bleeding, Gland problems, HIV and Persistent Infections.   Vitals Wt Readings from Last 3 Encounters:  05/23/14 184 lb 3.2 oz (83.553 kg)  05/03/14 179 lb 1.6 oz (81.239 kg)  04/01/14 180 lb 3 oz (81.733 kg)   Temp Readings from Last 3 Encounters:  05/23/14 98.6 F (37 C)   05/03/14 98.4 F (36.9 C) Oral  04/08/14 98.7 F (37.1 C) Oral   BP Readings from Last 3 Encounters:  05/23/14 108/46  05/03/14 126/59  04/08/14 113/58   Pulse Readings from Last 3 Encounters:  05/23/14 56  05/03/14 84  04/08/14 88       Physical Exam  General Mental Status-Alert. General Appearance-Consistent with stated age. Hydration-Well hydrated. Voice-Normal.  Abdomen Note: Soft, non distended, approp tender  at incisions. No erythema, no drainage.     Assessment & Plan COLON CANCER (153.9  C18.9) MALIGNANT NEOPLASM OF ASCENDING COLON (153.6  C18.2) Impression: Follow up in 6 weeks.  Refer to oncology.  Follow up with neprhology Current Plans  Follow up in 6 weeks or as needed Referred to Oncology, for evaluation and follow up (Oncology). Started Colace 100MG , 1 (one) Capsule two times daily, as needed, #60, 04/22/2014, Ref. x3.

## 2014-05-23 NOTE — Pre-Procedure Instructions (Signed)
Jorge Ross  05/23/2014   Your procedure is scheduled on:  05-24-2012   Tuesday    Report to [facility] Admitting at 5:30 AM.  Call this number if you have problems the morning of surgery: [phone]   Remember:   Do not eat food or drink liquids after midnight.    Take these medicines the morning of surgery with A SIP OF WATER: none   Do not wear jewelry,  .  Do not wear lotions, powders, or perfumes. You may not wear deodorant.   Do not shave 48 hours prior to surgery. Men may shave face and neck.  Do not bring valuables to the hospital.  [facility] is not responsible  for any belongings or valuables.               Contacts, dentures or bridgework may not be worn into surgery.     .               Patients discharged the day of surgery will not be allowed to drive home.   Name and phone number of your driver:     Special Instructions: See attached sheet for instructions on CHG shower/bath   Please read over the following fact sheets that you were given: Pain Booklet and Surgical Site Infection Prevention

## 2014-05-23 NOTE — Progress Notes (Signed)
This pt. Has scored at an elevated risk for obstructive sleep apnea using the STOP BANG TOOL during a pre-surgical visit. A score of 4 or greater is considered an elevated risk.

## 2014-05-24 ENCOUNTER — Ambulatory Visit (HOSPITAL_COMMUNITY): Payer: Medicare Other | Admitting: Anesthesiology

## 2014-05-24 ENCOUNTER — Ambulatory Visit (HOSPITAL_COMMUNITY)
Admission: RE | Admit: 2014-05-24 | Discharge: 2014-05-24 | Disposition: A | Discharge status: Home / Self Care | Payer: Medicare Other | Source: Ambulatory Visit | Attending: General Surgery | Admitting: General Surgery

## 2014-05-24 ENCOUNTER — Ambulatory Visit (HOSPITAL_COMMUNITY): Payer: Medicare Other

## 2014-05-24 ENCOUNTER — Encounter (HOSPITAL_COMMUNITY): Payer: Medicare Other | Admitting: Anesthesiology

## 2014-05-24 ENCOUNTER — Encounter (HOSPITAL_COMMUNITY)
Admission: RE | Disposition: A | Discharge status: Home / Self Care | Payer: Self-pay | Source: Ambulatory Visit | Attending: General Surgery

## 2014-05-24 ENCOUNTER — Encounter (HOSPITAL_COMMUNITY): Payer: Self-pay | Admitting: Anesthesiology

## 2014-05-24 DIAGNOSIS — K219 Gastro-esophageal reflux disease without esophagitis: Secondary | ICD-10-CM | POA: Diagnosis not present

## 2014-05-24 DIAGNOSIS — M1990 Unspecified osteoarthritis, unspecified site: Secondary | ICD-10-CM | POA: Insufficient documentation

## 2014-05-24 DIAGNOSIS — I10 Essential (primary) hypertension: Secondary | ICD-10-CM | POA: Diagnosis not present

## 2014-05-24 DIAGNOSIS — C182 Malignant neoplasm of ascending colon: Secondary | ICD-10-CM | POA: Diagnosis not present

## 2014-05-24 DIAGNOSIS — C801 Malignant (primary) neoplasm, unspecified: Secondary | ICD-10-CM

## 2014-05-24 DIAGNOSIS — Z95828 Presence of other vascular implants and grafts: Secondary | ICD-10-CM

## 2014-05-24 DIAGNOSIS — Z9049 Acquired absence of other specified parts of digestive tract: Secondary | ICD-10-CM | POA: Insufficient documentation

## 2014-05-24 HISTORY — PX: PORTACATH PLACEMENT: SHX2246

## 2014-05-24 SURGERY — INSERTION, TUNNELED CENTRAL VENOUS DEVICE, WITH PORT
Anesthesia: General

## 2014-05-24 MED ORDER — EPHEDRINE SULFATE 50 MG/ML IJ SOLN
INTRAMUSCULAR | Status: DC | PRN
  Administered 2014-05-24 (×2): 10 mg via INTRAVENOUS

## 2014-05-24 MED ORDER — BUPIVACAINE-EPINEPHRINE (PF) 0.25% -1:200000 IJ SOLN
INTRAMUSCULAR | Status: AC
  Filled 2014-05-24: qty 30

## 2014-05-24 MED ORDER — MIDAZOLAM HCL 5 MG/5ML IJ SOLN
INTRAMUSCULAR | Status: DC | PRN
  Administered 2014-05-24: 2 mg via INTRAVENOUS

## 2014-05-24 MED ORDER — FENTANYL CITRATE 0.05 MG/ML IJ SOLN
INTRAMUSCULAR | Status: DC | PRN
  Administered 2014-05-24: 100 ug via INTRAVENOUS

## 2014-05-24 MED ORDER — PROPOFOL 10 MG/ML IV BOLUS
INTRAVENOUS | Status: DC | PRN
  Administered 2014-05-24: 150 mg via INTRAVENOUS

## 2014-05-24 MED ORDER — LIDOCAINE HCL 1 % IJ SOLN
INTRAMUSCULAR | Status: DC | PRN
  Administered 2014-05-24: 08:00:00 via INTRAMUSCULAR

## 2014-05-24 MED ORDER — ROCURONIUM BROMIDE 50 MG/5ML IV SOLN
INTRAVENOUS | Status: AC
  Filled 2014-05-24: qty 1

## 2014-05-24 MED ORDER — HEPARIN SOD (PORK) LOCK FLUSH 100 UNIT/ML IV SOLN
INTRAVENOUS | Status: AC
  Filled 2014-05-24: qty 5

## 2014-05-24 MED ORDER — PROPOFOL 10 MG/ML IV BOLUS
INTRAVENOUS | Status: AC
  Filled 2014-05-24: qty 20

## 2014-05-24 MED ORDER — ONDANSETRON HCL 4 MG/2ML IJ SOLN
INTRAMUSCULAR | Status: DC | PRN
  Administered 2014-05-24: 4 mg via INTRAVENOUS

## 2014-05-24 MED ORDER — ONDANSETRON HCL 4 MG/2ML IJ SOLN: 4.0000 mg | Freq: Once | INTRAMUSCULAR | Status: DC | PRN

## 2014-05-24 MED ORDER — HEPARIN SODIUM (PORCINE) 5000 UNIT/ML IJ SOLN
INTRAMUSCULAR | Status: DC | PRN
  Administered 2014-05-24: 08:00:00

## 2014-05-24 MED ORDER — HYDROMORPHONE HCL 1 MG/ML IJ SOLN: 0.2500 mg | INTRAMUSCULAR | Status: DC | PRN

## 2014-05-24 MED ORDER — LIDOCAINE-EPINEPHRINE 1 %-1:100000 IJ SOLN
INTRAMUSCULAR | Status: AC
  Filled 2014-05-24: qty 1

## 2014-05-24 MED ORDER — LACTATED RINGERS IV SOLN
INTRAVENOUS | Status: DC | PRN
  Administered 2014-05-24: 07:00:00 via INTRAVENOUS

## 2014-05-24 MED ORDER — MIDAZOLAM HCL 2 MG/2ML IJ SOLN
INTRAMUSCULAR | Status: AC
  Filled 2014-05-24: qty 2

## 2014-05-24 MED ORDER — HEPARIN SOD (PORK) LOCK FLUSH 100 UNIT/ML IV SOLN
INTRAVENOUS | Status: DC | PRN
  Administered 2014-05-24: 500 [IU] via INTRAVENOUS

## 2014-05-24 MED ORDER — HYDROCODONE-ACETAMINOPHEN 5-325 MG PO TABS: 1.0000 | ORAL_TABLET | Freq: Four times a day (QID) | ORAL | Status: DC | PRN

## 2014-05-24 MED ORDER — ONDANSETRON HCL 4 MG/2ML IJ SOLN
INTRAMUSCULAR | Status: AC
  Filled 2014-05-24: qty 2

## 2014-05-24 MED ORDER — LIDOCAINE HCL (CARDIAC) 20 MG/ML IV SOLN
INTRAVENOUS | Status: DC | PRN
  Administered 2014-05-24: 100 mg via INTRAVENOUS

## 2014-05-24 MED ORDER — 0.9 % SODIUM CHLORIDE (POUR BTL) OPTIME
TOPICAL | Status: DC | PRN
  Administered 2014-05-24: 1000 mL

## 2014-05-24 MED ORDER — LIDOCAINE-EPINEPHRINE 1 %-1:100000 IJ SOLN
INTRAMUSCULAR | Status: DC | PRN
  Administered 2014-05-24: 5 mL

## 2014-05-24 MED ORDER — FENTANYL CITRATE 0.05 MG/ML IJ SOLN
INTRAMUSCULAR | Status: AC
  Filled 2014-05-24: qty 5

## 2014-05-24 SURGICAL SUPPLY — 47 items
BAG DECANTER FOR FLEXI CONT (MISCELLANEOUS) ×3 IMPLANT
BLADE SURG 11 STRL SS (BLADE) ×3 IMPLANT
BLADE SURG 15 STRL LF DISP TIS (BLADE) ×1 IMPLANT
BLADE SURG 15 STRL SS (BLADE) ×2
CANISTER SUCTION 2500CC (MISCELLANEOUS) ×3 IMPLANT
CHLORAPREP W/TINT 10.5 ML (MISCELLANEOUS) ×3 IMPLANT
COVER SURGICAL LIGHT HANDLE (MISCELLANEOUS) ×3 IMPLANT
CRADLE DONUT ADULT HEAD (MISCELLANEOUS) ×3 IMPLANT
DECANTER SPIKE VIAL GLASS SM (MISCELLANEOUS) ×6 IMPLANT
DERMABOND ADVANCED (GAUZE/BANDAGES/DRESSINGS) ×2
DERMABOND ADVANCED .7 DNX12 (GAUZE/BANDAGES/DRESSINGS) ×1 IMPLANT
DRAPE C-ARM 42X72 X-RAY (DRAPES) ×3 IMPLANT
DRAPE CHEST BREAST 15X10 FENES (DRAPES) ×3 IMPLANT
DRAPE UTILITY 15X26 W/TAPE STR (DRAPE) ×6 IMPLANT
DRSG TEGADERM 4X4.75 (GAUZE/BANDAGES/DRESSINGS) ×3 IMPLANT
ELECT COATED BLADE 2.86 ST (ELECTRODE) ×3 IMPLANT
ELECT REM PT RETURN 9FT ADLT (ELECTROSURGICAL) ×3
ELECTRODE REM PT RTRN 9FT ADLT (ELECTROSURGICAL) ×1 IMPLANT
GAUZE SPONGE 4X4 16PLY XRAY LF (GAUZE/BANDAGES/DRESSINGS) ×3 IMPLANT
GLOVE BIO SURGEON STRL SZ 6 (GLOVE) ×3 IMPLANT
GLOVE BIOGEL PI IND STRL 6.5 (GLOVE) ×3 IMPLANT
GLOVE BIOGEL PI INDICATOR 6.5 (GLOVE) ×6
GOWN STRL REUS W/ TWL LRG LVL3 (GOWN DISPOSABLE) ×1 IMPLANT
GOWN STRL REUS W/TWL 2XL LVL3 (GOWN DISPOSABLE) ×3 IMPLANT
GOWN STRL REUS W/TWL LRG LVL3 (GOWN DISPOSABLE) ×2
KIT BASIN OR (CUSTOM PROCEDURE TRAY) ×3 IMPLANT
KIT PORT POWER 8FR ISP CVUE (Catheter) ×3 IMPLANT
KIT PORT POWER ISP 8FR (Catheter) IMPLANT
KIT ROOM TURNOVER OR (KITS) ×3 IMPLANT
NEEDLE HYPO 25GX1X1/2 BEV (NEEDLE) ×3 IMPLANT
NS IRRIG 1000ML POUR BTL (IV SOLUTION) ×3 IMPLANT
PACK SURGICAL SETUP 50X90 (CUSTOM PROCEDURE TRAY) ×3 IMPLANT
PAD ARMBOARD 7.5X6 YLW CONV (MISCELLANEOUS) ×3 IMPLANT
PENCIL BUTTON HOLSTER BLD 10FT (ELECTRODE) ×3 IMPLANT
SUT MON AB 4-0 PC3 18 (SUTURE) ×3 IMPLANT
SUT PROLENE 2 0 SH DA (SUTURE) ×6 IMPLANT
SUT VIC AB 3-0 SH 27 (SUTURE) ×2
SUT VIC AB 3-0 SH 27X BRD (SUTURE) ×1 IMPLANT
SYR 20ML ECCENTRIC (SYRINGE) ×6 IMPLANT
SYR 5ML LUER SLIP (SYRINGE) ×3 IMPLANT
SYR CONTROL 10ML LL (SYRINGE) ×3 IMPLANT
TOWEL OR 17X24 6PK STRL BLUE (TOWEL DISPOSABLE) ×3 IMPLANT
TOWEL OR 17X26 10 PK STRL BLUE (TOWEL DISPOSABLE) ×3 IMPLANT
TUBE CONNECTING 12'X1/4 (SUCTIONS) ×1
TUBE CONNECTING 12X1/4 (SUCTIONS) ×2 IMPLANT
WATER STERILE IRR 1000ML POUR (IV SOLUTION) IMPLANT
YANKAUER SUCT BULB TIP NO VENT (SUCTIONS) IMPLANT

## 2014-05-24 NOTE — Interval H&P Note (Signed)
History and Physical Interval Note:  05/24/2014 6:54 AM  Jorge Ross  has presented today for surgery, with the diagnosis of COLON CANCER  The various methods of treatment have been discussed with the patient and family. After consideration of risks, benefits and other options for treatment, the patient has consented to  Procedure(s): INSERTION PORT-A-CATH (N/A) as a surgical intervention .  The patient's history has been reviewed, patient examined, no change in status, stable for surgery.  I have reviewed the patient's chart and labs.  Questions were answered to the patient's satisfaction.     Dionisios Ricci

## 2014-05-24 NOTE — Anesthesia Preprocedure Evaluation (Addendum)
Anesthesia Evaluation  Patient identified by MRN, date of birth, ID band Patient awake    Reviewed: Allergy & Precautions, H&P , NPO status , Patient's Chart, lab work & pertinent test results  Airway Mallampati: II  TM Distance: >3 FB Neck ROM: full    Dental  (+) Chipped, Dental Advidsory Given   Pulmonary          Cardiovascular hypertension,     Neuro/Psych    GI/Hepatic   Endo/Other    Renal/GU Renal InsufficiencyRenal disease     Musculoskeletal  (+) Arthritis -,   Abdominal   Peds  Hematology   Anesthesia Other Findings Colon CA. Front teeth chipped.  Reproductive/Obstetrics                            Anesthesia Physical Anesthesia Plan  ASA: II  Anesthesia Plan: General   Post-op Pain Management:    Induction: Intravenous  Airway Management Planned: LMA  Additional Equipment:   Intra-op Plan:   Post-operative Plan: Extubation in OR  Informed Consent: I have reviewed the patients History and Physical, chart, labs and discussed the procedure including the risks, benefits and alternatives for the proposed anesthesia with the patient or authorized representative who has indicated his/her understanding and acceptance.   Dental Advisory Given  Plan Discussed with: Anesthesiologist, CRNA and Surgeon  Anesthesia Plan Comments:        Anesthesia Quick Evaluation

## 2014-05-24 NOTE — Discharge Instructions (Addendum)
[facility] °Office Phone Number [phone] ° ° POST OP INSTRUCTIONS ° °Always review your discharge instruction sheet given to you by the facility where your surgery was performed. ° °IF YOU HAVE DISABILITY OR FAMILY LEAVE FORMS, YOU MUST BRING THEM TO THE OFFICE FOR PROCESSING.  DO NOT GIVE THEM TO YOUR DOCTOR. ° °1. A prescription for pain medication may be given to you upon discharge.  Take your pain medication as prescribed, if needed.  If narcotic pain medicine is not needed, then you may take acetaminophen (Tylenol) or ibuprofen (Advil) as needed. °2. Take your usually prescribed medications unless otherwise directed °3. If you need a refill on your pain medication, please contact your pharmacy.  They will contact our office to request authorization.  Prescriptions will not be filled after 5pm or on week-ends. °4. You should eat very light the first 24 hours after surgery, such as soup, crackers, pudding, etc.  Resume your normal diet the day after surgery °5. It is common to experience some constipation if taking pain medication after surgery.  Increasing fluid intake and taking a stool softener will usually help or prevent this problem from occurring.  A mild laxative (Milk of Magnesia or Miralax) should be taken according to package directions if there are no bowel movements after 48 hours. °6. You may shower in 48 hours.  The surgical glue will flake off in 2-3 weeks.   °7. ACTIVITIES:  No strenuous activity or heavy lifting for 1 week.   °a. You may drive when you no longer are taking prescription pain medication, you can comfortably wear a seatbelt, and you can safely maneuver your car and apply brakes. °b. RETURN TO WORK:  __________to be determined_______________ °You should see your doctor in the office for a follow-up appointment approximately three-four weeks after your surgery.   ° °WHEN TO CALL YOUR DOCTOR: °1. Fever over 101.0 °2. Nausea and/or vomiting. °3. Extreme swelling or  bruising. °4. Continued bleeding from incision. °5. Increased pain, redness, or drainage from the incision. ° °The clinic staff is available to answer your questions during regular business hours.  Please don’t hesitate to call and ask to speak to one of the nurses for clinical concerns.  If you have a medical emergency, go to the nearest emergency room or call 911.  A surgeon from [facility] is always on call at the hospital. ° °For further questions, please visit [email or web address]  ° ° °General Anesthesia, Adult, Care After  °Refer to this sheet in the next few weeks. These instructions provide you with information on caring for yourself after your procedure. Your health care provider may also give you more specific instructions. Your treatment has been planned according to current medical practices, but problems sometimes occur. Call your health care provider if you have any problems or questions after your procedure.  °WHAT TO EXPECT AFTER THE PROCEDURE  °After the procedure, it is typical to experience:  °Sleepiness.  °Nausea and vomiting. °HOME CARE INSTRUCTIONS  °For the first 24 hours after general anesthesia:  °Have a responsible person with you.  °Do not drive a car. If you are alone, do not take public transportation.  °Do not drink alcohol.  °Do not take medicine that has not been prescribed by your health care provider.  °Do not sign important papers or make important decisions.  °You may resume a normal diet and activities as directed by your health care provider.  °Change bandages (dressings) as directed.  °If you   have questions or problems that seem related to general anesthesia, call the hospital and ask for the anesthetist or anesthesiologist on call. °SEEK MEDICAL CARE IF:  °You have nausea and vomiting that continue the day after anesthesia.  °You develop a rash. °SEEK IMMEDIATE MEDICAL CARE IF:  °You have difficulty breathing.  °You have chest pain.  °You have any allergic  problems. °Document Released: 10/21/2000 Document Revised: 03/17/2013 Document Reviewed: 01/28/2013  °[employer]® Patient Information ©2014 [employer].  ° ° °

## 2014-05-24 NOTE — Op Note (Signed)
PREOPERATIVE DIAGNOSIS:  Colon cancer     POSTOPERATIVE DIAGNOSIS:  Same     PROCEDURE: left subclavian port placement, Bard ClearVue  Power Port, MRI safe, 8-French.      SURGEON:  Stark Klein, MD      ANESTHESIA:  General   FINDINGS:  Good venous return, easy flush, and tip of the catheter and   SVC 24.5 cm.      SPECIMEN:  None.      ESTIMATED BLOOD LOSS:  Minimal.      COMPLICATIONS:  None known.      PROCEDURE:  Pt was identified in the holding area and taken to   the operating room, where patient was placed supine on the operating room   table.  General anesthesia was induced.  Patient's arms were tucked and the upper   chest and neck were prepped and draped in sterile fashion.  Time-out was   performed according to the surgical safety check list.  When all was   correct, we continued.   Local anesthetic was administered over this   area at the angle of the clavicle.  The vein was accessed with 2 passes of the needle. There was good venous return and the wire passed easily with no ectopy.   Fluoroscopy was used to confirm that the wire was in the vena cava.      The patient was placed back level and the area for the pocket was anethetized   with local anesthetic.  A 3-cm transverse incision was made with a #15   blade.  Cautery was used to divide the subcutaneous tissues down to the   pectoralis muscle.  An [employer] retractor was used to elevate the skin   while a pocket was created on top of the pectoralis fascia.  The port   was placed into the pocket to confirm that it was of adequate size.  The   catheter was preattached to the port.  The port was then secured to the   pectoralis fascia with four 2-0 Prolene sutures.  These were clamped and   not tied down yet.    The catheter was tunneled through to the wire exit   site.  The catheter was placed along the wire to determine what length it should be to be in the SVC.  The catheter was cut at 24.5 cm.  The tunneler  sheath and dilator were passed over the wire and the dilator and wire were removed.  The catheter was advanced through the tunneler sheath and the tunneler sheath was pulled away.  Care was taken to keep the catheter in the tunneler sheath as this occurred. This was advanced and the tunneler sheath was removed.  There was good venous   return and easy flush of the catheter.  The Prolene sutures were tied   down to the pectoral fascia.  The skin was reapproximated using 3-0   Vicryl interrupted deep dermal sutures.    Fluoroscopy was used to re-confirm good position of the catheter.  The skin   was then closed using 4-0 Monocryl in a subcuticular fashion.  The port was flushed with concentrated heparin flush as well.  The wounds were then cleaned, dried, and dressed with Dermabond.  The patient was awakened from anesthesia and taken to the PACU in stable condition.  Needle, sponge, and instrument counts were correct.               Stark Klein, MD

## 2014-05-24 NOTE — Anesthesia Postprocedure Evaluation (Signed)
  Anesthesia Post-op Note  Patient: Jorge Ross  Procedure(s) Performed: Procedure(s): INSERTION PORT-A-CATH (N/A)  Patient Location: PACU  Anesthesia Type:General  Level of Consciousness: awake, alert , oriented and patient cooperative  Airway and Oxygen Therapy: Patient Spontanous Breathing  Post-op Pain: mild  Post-op Assessment: Post-op Vital signs reviewed, Patient's Cardiovascular Status Stable, Respiratory Function Stable, Patent Airway and No signs of Nausea or vomiting  Post-op Vital Signs: stable  Last Vitals:  Filed Vitals:   05/24/14 0915  BP: 146/64  Pulse: 76  Temp: 36.4 C  Resp: 14    Complications: No apparent anesthesia complications

## 2014-05-24 NOTE — Transfer of Care (Signed)
Immediate Anesthesia Transfer of Care Note  Patient: Jorge Ross  Procedure(s) Performed: Procedure(s): INSERTION PORT-A-CATH (N/A)  Patient Location: PACU  Anesthesia Type:General  Level of Consciousness: awake, alert  and oriented  Airway & Oxygen Therapy: Patient Spontanous Breathing and Patient connected to nasal cannula oxygen  Post-op Assessment: Report given to PACU RN, Post -op Vital signs reviewed and stable and Patient moving all extremities X 4  Post vital signs: Reviewed and stable  Complications: No apparent anesthesia complications

## 2014-05-24 NOTE — Anesthesia Procedure Notes (Signed)
Procedure Name: LMA Insertion Date/Time: 05/24/2014 7:39 AM Performed by: Neldon Newport Pre-anesthesia Checklist: Patient identified, Timeout performed, Emergency Drugs available, Suction available and Patient being monitored Patient Re-evaluated:Patient Re-evaluated prior to inductionOxygen Delivery Method: Circle system utilized Preoxygenation: Pre-oxygenation with 100% oxygen Intubation Type: IV induction Ventilation: Mask ventilation without difficulty LMA: LMA inserted LMA Size: 4.0 Placement Confirmation: positive ETCO2 and breath sounds checked- equal and bilateral Tube secured with: Tape Dental Injury: Teeth and Oropharynx as per pre-operative assessment

## 2014-05-25 ENCOUNTER — Encounter (HOSPITAL_COMMUNITY): Payer: Self-pay | Admitting: General Surgery

## 2014-05-31 ENCOUNTER — Telehealth: Payer: Self-pay | Admitting: Oncology

## 2014-05-31 ENCOUNTER — Ambulatory Visit (HOSPITAL_BASED_OUTPATIENT_CLINIC_OR_DEPARTMENT_OTHER): Payer: Medicare Other

## 2014-05-31 ENCOUNTER — Ambulatory Visit (HOSPITAL_BASED_OUTPATIENT_CLINIC_OR_DEPARTMENT_OTHER): Payer: Medicare Other | Admitting: Oncology

## 2014-05-31 ENCOUNTER — Other Ambulatory Visit (HOSPITAL_BASED_OUTPATIENT_CLINIC_OR_DEPARTMENT_OTHER): Payer: Medicare Other

## 2014-05-31 VITALS — BP 126/57 | HR 57 | Temp 98.0°F | Resp 18 | Ht 72.0 in | Wt 180.1 lb

## 2014-05-31 DIAGNOSIS — C642 Malignant neoplasm of left kidney, except renal pelvis: Secondary | ICD-10-CM

## 2014-05-31 DIAGNOSIS — D126 Benign neoplasm of colon, unspecified: Secondary | ICD-10-CM

## 2014-05-31 DIAGNOSIS — Z5111 Encounter for antineoplastic chemotherapy: Secondary | ICD-10-CM

## 2014-05-31 DIAGNOSIS — C180 Malignant neoplasm of cecum: Secondary | ICD-10-CM

## 2014-05-31 DIAGNOSIS — C189 Malignant neoplasm of colon, unspecified: Secondary | ICD-10-CM

## 2014-05-31 LAB — CBC WITH DIFFERENTIAL/PLATELET
BASO%: 0.7 % (ref 0.0–2.0)
BASOS ABS: 0 10*3/uL (ref 0.0–0.1)
EOS%: 1.5 % (ref 0.0–7.0)
Eosinophils Absolute: 0.1 10*3/uL (ref 0.0–0.5)
HCT: 38.6 % (ref 38.4–49.9)
HEMOGLOBIN: 12.6 g/dL — AB (ref 13.0–17.1)
LYMPH%: 34.7 % (ref 14.0–49.0)
MCH: 31.6 pg (ref 27.2–33.4)
MCHC: 32.8 g/dL (ref 32.0–36.0)
MCV: 96.3 fL (ref 79.3–98.0)
MONO#: 0.4 10*3/uL (ref 0.1–0.9)
MONO%: 9.4 % (ref 0.0–14.0)
NEUT%: 53.7 % (ref 39.0–75.0)
NEUTROS ABS: 2.5 10*3/uL (ref 1.5–6.5)
PLATELETS: 172 10*3/uL (ref 140–400)
RBC: 4 10*6/uL — AB (ref 4.20–5.82)
RDW: 13.3 % (ref 11.0–14.6)
WBC: 4.6 10*3/uL (ref 4.0–10.3)
lymph#: 1.6 10*3/uL (ref 0.9–3.3)

## 2014-05-31 LAB — COMPREHENSIVE METABOLIC PANEL (CC13)
ALT: 17 U/L (ref 0–55)
ANION GAP: 9 meq/L (ref 3–11)
AST: 15 U/L (ref 5–34)
Albumin: 4.1 g/dL (ref 3.5–5.0)
Alkaline Phosphatase: 75 U/L (ref 40–150)
BUN: 14.2 mg/dL (ref 7.0–26.0)
CO2: 27 meq/L (ref 22–29)
CREATININE: 1 mg/dL (ref 0.7–1.3)
Calcium: 9.8 mg/dL (ref 8.4–10.4)
Chloride: 104 mEq/L (ref 98–109)
GLUCOSE: 113 mg/dL (ref 70–140)
Potassium: 3.7 mEq/L (ref 3.5–5.1)
SODIUM: 140 meq/L (ref 136–145)
TOTAL PROTEIN: 7.4 g/dL (ref 6.4–8.3)
Total Bilirubin: 0.57 mg/dL (ref 0.20–1.20)

## 2014-05-31 MED ORDER — LEUCOVORIN CALCIUM INJECTION 350 MG
400.0000 mg/m2 | Freq: Once | INTRAVENOUS | Status: AC
  Administered 2014-05-31: 812 mg via INTRAVENOUS
  Filled 2014-05-31: qty 40.6

## 2014-05-31 MED ORDER — ONDANSETRON 8 MG/NS 50 ML IVPB
INTRAVENOUS | Status: AC
  Filled 2014-05-31: qty 8

## 2014-05-31 MED ORDER — DEXAMETHASONE SODIUM PHOSPHATE 10 MG/ML IJ SOLN
INTRAMUSCULAR | Status: AC
  Filled 2014-05-31: qty 1

## 2014-05-31 MED ORDER — DEXAMETHASONE SODIUM PHOSPHATE 10 MG/ML IJ SOLN
10.0000 mg | Freq: Once | INTRAMUSCULAR | Status: AC
  Administered 2014-05-31: 10 mg via INTRAVENOUS

## 2014-05-31 MED ORDER — SODIUM CHLORIDE 0.9 % IV SOLN
2400.0000 mg/m2 | INTRAVENOUS | Status: DC
  Administered 2014-05-31: 4850 mg via INTRAVENOUS
  Filled 2014-05-31: qty 97

## 2014-05-31 MED ORDER — FLUOROURACIL CHEMO INJECTION 2.5 GM/50ML
400.0000 mg/m2 | Freq: Once | INTRAVENOUS | Status: AC
  Administered 2014-05-31: 800 mg via INTRAVENOUS
  Filled 2014-05-31: qty 16

## 2014-05-31 MED ORDER — OXALIPLATIN CHEMO INJECTION 100 MG/20ML
85.0000 mg/m2 | Freq: Once | INTRAVENOUS | Status: AC
  Administered 2014-05-31: 175 mg via INTRAVENOUS
  Filled 2014-05-31: qty 35

## 2014-05-31 MED ORDER — ONDANSETRON 8 MG/50ML IVPB (CHCC)
8.0000 mg | Freq: Once | INTRAVENOUS | Status: AC
  Administered 2014-05-31: 8 mg via INTRAVENOUS

## 2014-05-31 MED ORDER — DEXTROSE 5 % IV SOLN
Freq: Once | INTRAVENOUS | Status: AC
  Administered 2014-05-31: 12:00:00 via INTRAVENOUS

## 2014-05-31 NOTE — Progress Notes (Signed)
Hematology and Oncology Follow Up Visit  Jorge Ross [id number] 1940/11/05 73 y.o. 05/31/2014 9:12 AM [name], MDClark, Don Broach, MD   Principle Diagnosis: 73 year old with colon cancer diagnosed in September 2015. He presented with a cecal mass and found to have a T1 N1 disease. He also has T1 N0 renal cell carcinoma diagnosed at around the same time.   Prior Therapy: He is status post right colectomy done laparoscopically on 04/01/2014. He had a T1 N1 disease was 0 out of 12 lymph nodes involved. He also status post partial nephrectomy on 04/01/2014 for a papillary tumor.  Current therapy: FOLFOX adjuvant chemotherapy cycle 1 to start on 05/31/2014.  Interim History:  Mr. Commisso presents today for a follow-up visit. Since his last visit, he had a Port-A-Cath inserted as well as chemotherapy education class. He is ready to proceed with chemotherapy now. He does not report any headaches, blurry vision, syncope. He does not report any chest pain, shortness of breath, cough. He does not report any lower extremity edema or palpitation. He does not report any nausea, vomiting, constipation, diarrhea or hematochezia. He does not report any frequency, urgency or hesitancy. He does not report any skeletal complaints. Rest of his review of systems unremarkable.  Medications: I have reviewed the patient's current medications.  Current Outpatient Prescriptions  Medication Sig Dispense Refill  . aspirin 81 MG tablet Take 81 mg by mouth daily.      . irbesartan-hydrochlorothiazide (AVALIDE) 150-12.5 MG per tablet Take 1 tablet by mouth every morning.     . lidocaine-prilocaine (EMLA) cream Apply 1 application topically as needed.  0  . Multiple Vitamin (MULTIVITAMIN) capsule Take 1 capsule by mouth daily.      . ondansetron (ZOFRAN) 8 MG tablet Take 8 mg by mouth every 8 (eight) hours as needed.  0  . vitamin E 400 UNIT capsule Take 400 Units by mouth daily.     Marland Kitchen HYDROcodone-acetaminophen  (NORCO/VICODIN) 5-325 MG per tablet Take 1-2 tablets by mouth every 6 (six) hours as needed for moderate pain or severe pain. 40 tablet 0   No current facility-administered medications for this visit.     Allergies:  Allergies  Allergen Reactions  . Codeine     constipation    Past Medical History, Surgical history, Social history, and Family History were reviewed and updated.   Physical Exam: Blood pressure 126/57, pulse 57, temperature 98 F (36.7 C), temperature source Oral, resp. rate 18, height 6' (1.829 m), weight 180 lb 1.6 oz (81.693 kg), SpO2 100 %. ECOG: 0 General appearance: alert and cooperative Head: Normocephalic, without obvious abnormality Neck: no adenopathy Lymph nodes: Cervical, supraclavicular, and axillary nodes normal. Heart:regular rate and rhythm, S1, S2 normal, no murmur, click, rub or gallop Lung:chest clear, no wheezing, rales, normal symmetric air entry, Heart exam - S1, S2 normal, no murmur, no gallop, rate regular Abdomin: soft, non-tender, without masses or organomegaly EXT:no erythema, induration, or nodules   Lab Results: Lab Results  Component Value Date   WBC 4.6 05/31/2014   HGB 12.6* 05/31/2014   HCT 38.6 05/31/2014   MCV 96.3 05/31/2014   PLT 172 05/31/2014     Chemistry      Component Value Date/Time   NA 141 05/23/2014 1549   K 4.1 05/23/2014 1549   CL 104 05/23/2014 1549   CO2 26 05/23/2014 1549   BUN 17 05/23/2014 1549   CREATININE 0.78 05/23/2014 1549      Component Value Date/Time  CALCIUM 9.4 05/23/2014 1549   ALKPHOS 55 02/15/2014 1121   AST 24 02/15/2014 1121   ALT 21 02/15/2014 1121   BILITOT 1.0 02/15/2014 1121       Impression and Plan:   73 year old gentleman with the following issues:  1. 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 He is status post laparoscopic right colectomy done on 04/01/2014. He has T1 N1 disease with 1 out of 12 lymph nodes involved. He is ready to proceed with  FOLFOX adjuvant chemotherapy. Complications from this regimen was discussed again including nausea, vomiting, myelosuppression, neutropenia, neutropenic sepsis, peripheral neuropathy and cold sensitivity.I plan on giving him 12 cycles for a total of 6 months if he can tolerate it.  2. IV access: Port-A-Cath has been inserted without any complications.  3. Antiemetics: Prescription was given to the patient previously.  4. Renal cell carcinoma status post resection for a T1 N0 disease. The histology showed papillary type.  5. Follow-up: Will be in 2 weeks for cycle 2 of FOLFOX.  [facility], MD 11/3/20159:12 AM

## 2014-05-31 NOTE — Patient Instructions (Addendum)
[facility] Discharge Instructions for Patients Receiving Chemotherapy  Today you received the following chemotherapy agents: Oxaliplatin, Leucovorin, 5FU  To help prevent nausea and vomiting after your treatment, we encourage you to take your nausea medication as prescribed.    If you develop nausea and vomiting that is not controlled by your nausea medication, call the clinic.   BELOW ARE SYMPTOMS THAT SHOULD BE REPORTED IMMEDIATELY:  *FEVER GREATER THAN 100.5 F  *CHILLS WITH OR WITHOUT FEVER  NAUSEA AND VOMITING THAT IS NOT CONTROLLED WITH YOUR NAUSEA MEDICATION  *UNUSUAL SHORTNESS OF BREATH  *UNUSUAL BRUISING OR BLEEDING  TENDERNESS IN MOUTH AND THROAT WITH OR WITHOUT PRESENCE OF ULCERS  *URINARY PROBLEMS  *BOWEL PROBLEMS  UNUSUAL RASH Items with * indicate a potential emergency and should be followed up as soon as possible.  Feel free to call the clinic you have any questions or concerns. The clinic phone number is [phone].   Oxaliplatin Injection What is this medicine? OXALIPLATIN (ox AL i PLA tin) is a chemotherapy drug. It targets fast dividing cells, like cancer cells, and causes these cells to die. This medicine is used to treat cancers of the colon and rectum, and many other cancers. This medicine may be used for other purposes; ask your health care provider or pharmacist if you have questions. COMMON BRAND NAME(S): Eloxatin What should I tell my health care provider before I take this medicine? They need to know if you have any of these conditions: -kidney disease -an unusual or allergic reaction to oxaliplatin, other chemotherapy, other medicines, foods, dyes, or preservatives -pregnant or trying to get pregnant -breast-feeding How should I use this medicine? This drug is given as an infusion into a vein. It is administered in a hospital or clinic by a specially trained health care professional. Talk to your pediatrician regarding the  use of this medicine in children. Special care may be needed. Overdosage: If you think you have taken too much of this medicine contact a poison control center or emergency room at once. NOTE: This medicine is only for you. Do not share this medicine with others. What if I miss a dose? It is important not to miss a dose. Call your doctor or health care professional if you are unable to keep an appointment. What may interact with this medicine? -medicines to increase blood counts like filgrastim, pegfilgrastim, sargramostim -probenecid -some antibiotics like amikacin, gentamicin, neomycin, polymyxin B, streptomycin, tobramycin -zalcitabine Talk to your doctor or health care professional before taking any of these medicines: -acetaminophen -aspirin -ibuprofen -ketoprofen -naproxen This list may not describe all possible interactions. Give your health care provider a list of all the medicines, herbs, non-prescription drugs, or dietary supplements you use. Also tell them if you smoke, drink alcohol, or use illegal drugs. Some items may interact with your medicine. What should I watch for while using this medicine? Your condition will be monitored carefully while you are receiving this medicine. You will need important blood work done while you are taking this medicine. This medicine can make you more sensitive to cold. Do not drink cold drinks or use ice. Cover exposed skin before coming in contact with cold temperatures or cold objects. When out in cold weather wear warm clothing and cover your mouth and nose to warm the air that goes into your lungs. Tell your doctor if you get sensitive to the cold. This drug may make you feel generally unwell. This is not uncommon, as chemotherapy can affect  healthy cells as well as cancer cells. Report any side effects. Continue your course of treatment even though you feel ill unless your doctor tells you to stop. In some cases, you may be given additional  medicines to help with side effects. Follow all directions for their use. Call your doctor or health care professional for advice if you get a fever, chills or sore throat, or other symptoms of a cold or flu. Do not treat yourself. This drug decreases your body's ability to fight infections. Try to avoid being around people who are sick. This medicine may increase your risk to bruise or bleed. Call your doctor or health care professional if you notice any unusual bleeding. Be careful brushing and flossing your teeth or using a toothpick because you may get an infection or bleed more easily. If you have any dental work done, tell your dentist you are receiving this medicine. Avoid taking products that contain aspirin, acetaminophen, ibuprofen, naproxen, or ketoprofen unless instructed by your doctor. These medicines may hide a fever. Do not become pregnant while taking this medicine. Women should inform their doctor if they wish to become pregnant or think they might be pregnant. There is a potential for serious side effects to an unborn child. Talk to your health care professional or pharmacist for more information. Do not breast-feed an infant while taking this medicine. Call your doctor or health care professional if you get diarrhea. Do not treat yourself. What side effects may I notice from receiving this medicine? Side effects that you should report to your doctor or health care professional as soon as possible: -allergic reactions like skin rash, itching or hives, swelling of the face, lips, or tongue -low blood counts - This drug may decrease the number of white blood cells, red blood cells and platelets. You may be at increased risk for infections and bleeding. -signs of infection - fever or chills, cough, sore throat, pain or difficulty passing urine -signs of decreased platelets or bleeding - bruising, pinpoint red spots on the skin, black, tarry stools, nosebleeds -signs of decreased red  blood cells - unusually weak or tired, fainting spells, lightheadedness -breathing problems -chest pain, pressure -cough -diarrhea -jaw tightness -mouth sores -nausea and vomiting -pain, swelling, redness or irritation at the injection site -pain, tingling, numbness in the hands or feet -problems with balance, talking, walking -redness, blistering, peeling or loosening of the skin, including inside the mouth -trouble passing urine or change in the amount of urine Side effects that usually do not require medical attention (report to your doctor or health care professional if they continue or are bothersome): -changes in vision -constipation -hair loss -loss of appetite -metallic taste in the mouth or changes in taste -stomach pain This list may not describe all possible side effects. Call your doctor for medical advice about side effects. You may report side effects to FDA at 1-800-FDA-1088. Where should I keep my medicine? This drug is given in a hospital or clinic and will not be stored at home. NOTE: This sheet is a summary. It may not cover all possible information. If you have questions about this medicine, talk to your doctor, pharmacist, or health care provider.  2015, [employer]vier/[employer] Standard. (2008-02-09 17:22:47)    Leucovorin injection What is this medicine? LEUCOVORIN (loo koe VOR in) is used to prevent or treat the harmful effects of some medicines. This medicine is used to treat anemia caused by a low amount of folic acid in the body. It is  also used with 5-fluorouracil (5-FU) to treat colon cancer. This medicine may be used for other purposes; ask your health care provider or pharmacist if you have questions. What should I tell my health care provider before I take this medicine? They need to know if you have any of these conditions: -anemia from low levels of vitamin B-12 in the blood -an unusual or allergic reaction to leucovorin, folic acid, other medicines, foods,  dyes, or preservatives -pregnant or trying to get pregnant -breast-feeding How should I use this medicine? This medicine is for injection into a muscle or into a vein. It is given by a health care professional in a hospital or clinic setting. Talk to your pediatrician regarding the use of this medicine in children. Special care may be needed. Overdosage: If you think you have taken too much of this medicine contact a poison control center or emergency room at once. NOTE: This medicine is only for you. Do not share this medicine with others. What if I miss a dose? This does not apply. What may interact with this medicine? -capecitabine -fluorouracil -phenobarbital -phenytoin -primidone -trimethoprim-sulfamethoxazole This list may not describe all possible interactions. Give your health care provider a list of all the medicines, herbs, non-prescription drugs, or dietary supplements you use. Also tell them if you smoke, drink alcohol, or use illegal drugs. Some items may interact with your medicine. What should I watch for while using this medicine? Your condition will be monitored carefully while you are receiving this medicine. This medicine may increase the side effects of 5-fluorouracil, 5-FU. Tell your doctor or health care professional if you have diarrhea or mouth sores that do not get better or that get worse. What side effects may I notice from receiving this medicine? Side effects that you should report to your doctor or health care professional as soon as possible: -allergic reactions like skin rash, itching or hives, swelling of the face, lips, or tongue -breathing problems -fever, infection -mouth sores -unusual bleeding or bruising -unusually weak or tired Side effects that usually do not require medical attention (report to your doctor or health care professional if they continue or are bothersome): -constipation or diarrhea -loss of appetite -nausea, vomiting This list  may not describe all possible side effects. Call your doctor for medical advice about side effects. You may report side effects to FDA at 1-800-FDA-1088. Where should I keep my medicine? This drug is given in a hospital or clinic and will not be stored at home. NOTE: This sheet is a summary. It may not cover all possible information. If you have questions about this medicine, talk to your doctor, pharmacist, or health care provider.  2015, [employer] Standard. (2008-01-19 16:50:29)    Fluorouracil, 5-FU injection What is this medicine? FLUOROURACIL, 5-FU (flure oh YOOR a sil) is a chemotherapy drug. It slows the growth of cancer cells. This medicine is used to treat many types of cancer like breast cancer, colon or rectal cancer, pancreatic cancer, and stomach cancer. This medicine may be used for other purposes; ask your health care provider or pharmacist if you have questions. COMMON BRAND NAME(S): Adrucil What should I tell my health care provider before I take this medicine? They need to know if you have any of these conditions: -blood disorders -dihydropyrimidine dehydrogenase (DPD) deficiency -infection (especially a virus infection such as chickenpox, cold sores, or herpes) -kidney disease -liver disease -malnourished, poor nutrition -recent or ongoing radiation therapy -an unusual or allergic reaction to fluorouracil, other chemotherapy,  other medicines, foods, dyes, or preservatives -pregnant or trying to get pregnant -breast-feeding How should I use this medicine? This drug is given as an infusion or injection into a vein. It is administered in a hospital or clinic by a specially trained health care professional. Talk to your pediatrician regarding the use of this medicine in children. Special care may be needed. Overdosage: If you think you have taken too much of this medicine contact a poison control center or emergency room at once. NOTE: This medicine is only for you.  Do not share this medicine with others. What if I miss a dose? It is important not to miss your dose. Call your doctor or health care professional if you are unable to keep an appointment. What may interact with this medicine? -allopurinol -cimetidine -dapsone -digoxin -hydroxyurea -leucovorin -levamisole -medicines for seizures like ethotoin, fosphenytoin, phenytoin -medicines to increase blood counts like filgrastim, pegfilgrastim, sargramostim -medicines that treat or prevent blood clots like warfarin, enoxaparin, and dalteparin -methotrexate -metronidazole -pyrimethamine -some other chemotherapy drugs like busulfan, cisplatin, estramustine, vinblastine -trimethoprim -trimetrexate -vaccines Talk to your doctor or health care professional before taking any of these medicines: -acetaminophen -aspirin -ibuprofen -ketoprofen -naproxen This list may not describe all possible interactions. Give your health care provider a list of all the medicines, herbs, non-prescription drugs, or dietary supplements you use. Also tell them if you smoke, drink alcohol, or use illegal drugs. Some items may interact with your medicine. What should I watch for while using this medicine? Visit your doctor for checks on your progress. This drug may make you feel generally unwell. This is not uncommon, as chemotherapy can affect healthy cells as well as cancer cells. Report any side effects. Continue your course of treatment even though you feel ill unless your doctor tells you to stop. In some cases, you may be given additional medicines to help with side effects. Follow all directions for their use. Call your doctor or health care professional for advice if you get a fever, chills or sore throat, or other symptoms of a cold or flu. Do not treat yourself. This drug decreases your body's ability to fight infections. Try to avoid being around people who are sick. This medicine may increase your risk to bruise  or bleed. Call your doctor or health care professional if you notice any unusual bleeding. Be careful brushing and flossing your teeth or using a toothpick because you may get an infection or bleed more easily. If you have any dental work done, tell your dentist you are receiving this medicine. Avoid taking products that contain aspirin, acetaminophen, ibuprofen, naproxen, or ketoprofen unless instructed by your doctor. These medicines may hide a fever. Do not become pregnant while taking this medicine. Women should inform their doctor if they wish to become pregnant or think they might be pregnant. There is a potential for serious side effects to an unborn child. Talk to your health care professional or pharmacist for more information. Do not breast-feed an infant while taking this medicine. Men should inform their doctor if they wish to father a child. This medicine may lower sperm counts. Do not treat diarrhea with over the counter products. Contact your doctor if you have diarrhea that lasts more than 2 days or if it is severe and watery. This medicine can make you more sensitive to the sun. Keep out of the sun. If you cannot avoid being in the sun, wear protective clothing and use sunscreen. Do not use sun lamps or  tanning beds/booths. What side effects may I notice from receiving this medicine? Side effects that you should report to your doctor or health care professional as soon as possible: -allergic reactions like skin rash, itching or hives, swelling of the face, lips, or tongue -low blood counts - this medicine may decrease the number of white blood cells, red blood cells and platelets. You may be at increased risk for infections and bleeding. -signs of infection - fever or chills, cough, sore throat, pain or difficulty passing urine -signs of decreased platelets or bleeding - bruising, pinpoint red spots on the skin, black, tarry stools, blood in the urine -signs of decreased red blood cells  - unusually weak or tired, fainting spells, lightheadedness -breathing problems -changes in vision -chest pain -mouth sores -nausea and vomiting -pain, swelling, redness at site where injected -pain, tingling, numbness in the hands or feet -redness, swelling, or sores on hands or feet -stomach pain -unusual bleeding Side effects that usually do not require medical attention (report to your doctor or health care professional if they continue or are bothersome): -changes in finger or toe nails -diarrhea -dry or itchy skin -hair loss -headache -loss of appetite -sensitivity of eyes to the light -stomach upset -unusually teary eyes This list may not describe all possible side effects. Call your doctor for medical advice about side effects. You may report side effects to FDA at 1-800-FDA-1088. Where should I keep my medicine? This drug is given in a hospital or clinic and will not be stored at home. NOTE: This sheet is a summary. It may not cover all possible information. If you have questions about this medicine, talk to your doctor, pharmacist, or health care provider.  2015, [employer] Standard. (2007-11-18 13:53:16)

## 2014-05-31 NOTE — Progress Notes (Signed)
1st time [facility]. Tolerated well. No reaction. Reviewed chemo, consent obtained.. Given spill kit and instructions. Pt. Verbalized understanding.

## 2014-05-31 NOTE — Telephone Encounter (Signed)
gv adn printed appt sched and avs for pt for NOV adn Dec...sed added tx. °

## 2014-06-01 ENCOUNTER — Telehealth: Payer: Self-pay | Admitting: *Deleted

## 2014-06-01 NOTE — Telephone Encounter (Signed)
-----  Message from Otila Kluver, RN sent at 05/31/2014  3:54 PM EST ----- Regarding: 1st time chemo Contact: [phone] 1st time [facility]. Tolerated well. Pump infusing well at d/c. Has spill kit and d/c instructions.

## 2014-06-01 NOTE — Telephone Encounter (Signed)
[place] for chemotherapy F/U.  He is not at home and I spoke with wife Charleston Ropes.  Patient is doing well.  Denies n/v.  Denies any new side effects or symptoms.  Bowel and bladder is functioning well.  Eating and drinking well.  Daisy reports he ate a BIG breakfast with no trouble swallowing or complaints.  Instructed to drink 64 oz minimum daily or at least the day before, of and after treatment.  Daisy asked what are his limitations?  Reviewed how to be carefull with ambulatory pump when going to bathroom and driving with seatbelt but he is not limited.  Denies further questions at this time and encouraged to call if needed.  Reviewed how to call after hours in the case of an emergency.

## 2014-06-02 ENCOUNTER — Encounter: Payer: Self-pay | Admitting: *Deleted

## 2014-06-02 ENCOUNTER — Ambulatory Visit (HOSPITAL_BASED_OUTPATIENT_CLINIC_OR_DEPARTMENT_OTHER): Payer: Medicare Other

## 2014-06-02 DIAGNOSIS — C189 Malignant neoplasm of colon, unspecified: Secondary | ICD-10-CM

## 2014-06-02 DIAGNOSIS — C180 Malignant neoplasm of cecum: Secondary | ICD-10-CM

## 2014-06-02 DIAGNOSIS — C642 Malignant neoplasm of left kidney, except renal pelvis: Secondary | ICD-10-CM

## 2014-06-02 MED ORDER — SODIUM CHLORIDE 0.9 % IJ SOLN
10.0000 mL | INTRAMUSCULAR | Status: DC | PRN
  Administered 2014-06-02: 10 mL
  Filled 2014-06-02: qty 10

## 2014-06-02 MED ORDER — HEPARIN SOD (PORK) LOCK FLUSH 100 UNIT/ML IV SOLN
500.0000 [IU] | Freq: Once | INTRAVENOUS | Status: AC | PRN
  Administered 2014-06-02: 500 [IU]
  Filled 2014-06-02: qty 5

## 2014-06-02 NOTE — Progress Notes (Signed)
Spoke with patient. Yesterday He called call-a-nurse with hiccups. Dr Alvy Bimler called in baclofen. Patient states he took a spoonful of sugar and they stopped. Has not picked up script for baclofen yet. Will do so today. States he feels good and is eating and drinking well. Will be here today for 5-fu pump d/c. Note to dr Alen Blew.

## 2014-06-02 NOTE — Patient Instructions (Signed)
Fluorouracil, 5FU; Diclofenac topical cream What is this medicine? FLUOROURACIL; DICLOFENAC (flure oh YOOR a sil; dye KLOE fen ak) is a combination of a topical chemotherapy agent and non-steroidal anti-inflammatory drug (NSAID). It is used on the skin to treat skin cancer and skin conditions that could become cancer. This medicine may be used for other purposes; ask your health care provider or pharmacist if you have questions. COMMON BRAND NAME(S): FLUORAC What should I tell my health care provider before I take this medicine? They need to know if you have any of these conditions: -bleeding problems -cigarette smoker -DPD enzyme deficiency -heart disease -high blood pressure -if you frequently drink alcohol containing drinks -kidney disease -liver disease -open or infected skin -stomach problems -swelling or open sores at the treatment site -recent or planned coronary artery bypass graft (CABG) surgery -an unusual or allergic reaction to fluorouracil, diclofenac, aspirin, other NSAIDs, other medicines, foods, dyes, or preservatives -pregnant or trying to get pregnant -breast-feeding How should I use this medicine? This medicine is only for use on the skin. Follow the directions on the prescription label. Wash hands before and after use. Wash affected area and gently pat dry. To apply this medicine use a cotton-tipped applicator, or use gloves if applying with fingertips. If applied with unprotected fingertips, it is very important to wash your hands well after you apply this medicine. Avoid applying to the eyes, nose, or mouth. Apply enough medicine to cover the affected area. You can cover the area with a light gauze dressing, but do not use tight or air-tight dressings. Finish the full course prescribed by your doctor or health care professional, even if you think your condition is better. Do not stop taking except on the advice of your doctor or health care professional. Talk to your  pediatrician regarding the use of this medicine in children. Special care may be needed. Overdosage: If you think you've taken too much of this medicine contact a poison control center or emergency room at once. Overdosage: If you think you have taken too much of this medicine contact a poison control center or emergency room at once. NOTE: This medicine is only for you. Do not share this medicine with others. What if I miss a dose? If you miss a dose, apply it as soon as you can. If it is almost time for your next dose, only use that dose. Do not apply extra doses. Contact your doctor or health care professional if you miss more than one dose. What may interact with this medicine? Interactions are not expected. Do not use any other skin products without telling your doctor or health care professional. This list may not describe all possible interactions. Give your health care provider a list of all the medicines, herbs, non-prescription drugs, or dietary supplements you use. Also tell them if you smoke, drink alcohol, or use illegal drugs. Some items may interact with your medicine. What should I watch for while using this medicine? Visit your doctor or health care professional for checks on your progress. You will need to use this medicine for 2 to 6 weeks. This may be longer depending on the condition being treated. You may not see full healing for another 1 to 2 months after you stop using the medicine. Treated areas of skin can look unsightly during and for several weeks after treatment with this medicine. This medicine can make you more sensitive to the sun. Keep out of the sun. If you cannot avoid being in   the sun, wear protective clothing and use sunscreen. Do not use sun lamps or tanning beds/booths. Where should I keep my What side effects may I notice from receiving this medicine? Side effects that you should report to your doctor or health care professional as soon as possible: -allergic  reactions like skin rash, itching or hives, swelling of the face, lips, or tongue -black or bloody stools, blood in the urine or vomit -blurred vision -chest pain -difficulty breathing or wheezing -redness, blistering, peeling or loosening of the skin, including inside the mouth -severe redness and swelling of normal skin -slurred speech or weakness on one side of the body -trouble passing urine or change in the amount of urine -unexplained weight gain or swelling -unusually weak or tired -yellowing of eyes or skin Side effects that usually do not require medical attention (Report these to your doctor or health care professional if they continue or are bothersome.): -increased sensitivity of the skin to sun and ultraviolet light -pain and burning of the affected area -scaling or swelling of the affected area -skin rash, itching of the affected area -tenderness This list may not describe all possible side effects. Call your doctor for medical advice about side effects. You may report side effects to FDA at 1-800-FDA-1088. Where should I keep my medicine? Keep out of the reach of children. Store at room temperature between 20 and 25 degrees C (68 and 77 degrees F). Throw away any unused medicine after the expiration date. NOTE: This sheet is a summary. It may not cover all possible information. If you have questions about this medicine, talk to your doctor, pharmacist, or health care provider.  2015, [employer] Standard. (2013-11-15 11:09:58)  

## 2014-06-08 ENCOUNTER — Other Ambulatory Visit: Payer: Self-pay | Admitting: *Deleted

## 2014-06-08 DIAGNOSIS — C189 Malignant neoplasm of colon, unspecified: Secondary | ICD-10-CM

## 2014-06-08 MED ORDER — ONDANSETRON HCL 8 MG PO TABS: 8.0000 mg | ORAL_TABLET | Freq: Three times a day (TID) | ORAL | Status: DC | PRN

## 2014-06-14 ENCOUNTER — Ambulatory Visit (HOSPITAL_BASED_OUTPATIENT_CLINIC_OR_DEPARTMENT_OTHER): Payer: Medicare Other | Admitting: Physician Assistant

## 2014-06-14 ENCOUNTER — Encounter: Payer: Self-pay | Admitting: Physician Assistant

## 2014-06-14 ENCOUNTER — Telehealth: Payer: Self-pay | Admitting: Oncology

## 2014-06-14 ENCOUNTER — Ambulatory Visit (HOSPITAL_BASED_OUTPATIENT_CLINIC_OR_DEPARTMENT_OTHER): Payer: Medicare Other

## 2014-06-14 ENCOUNTER — Other Ambulatory Visit (HOSPITAL_BASED_OUTPATIENT_CLINIC_OR_DEPARTMENT_OTHER): Payer: Medicare Other

## 2014-06-14 VITALS — BP 135/68 | HR 77 | Temp 97.5°F | Resp 18 | Ht 72.0 in | Wt 180.4 lb

## 2014-06-14 DIAGNOSIS — C227 Other specified carcinomas of liver: Secondary | ICD-10-CM | POA: Diagnosis not present

## 2014-06-14 DIAGNOSIS — C180 Malignant neoplasm of cecum: Secondary | ICD-10-CM

## 2014-06-14 DIAGNOSIS — C189 Malignant neoplasm of colon, unspecified: Secondary | ICD-10-CM

## 2014-06-14 DIAGNOSIS — Z5111 Encounter for antineoplastic chemotherapy: Secondary | ICD-10-CM

## 2014-06-14 LAB — CBC WITH DIFFERENTIAL/PLATELET
BASO%: 0.3 % (ref 0.0–2.0)
Basophils Absolute: 0 10*3/uL (ref 0.0–0.1)
EOS%: 1.2 % (ref 0.0–7.0)
Eosinophils Absolute: 0.1 10*3/uL (ref 0.0–0.5)
HCT: 35.9 % — ABNORMAL LOW (ref 38.4–49.9)
HGB: 12.2 g/dL — ABNORMAL LOW (ref 13.0–17.1)
LYMPH%: 19.8 % (ref 14.0–49.0)
MCH: 31.4 pg (ref 27.2–33.4)
MCHC: 34 g/dL (ref 32.0–36.0)
MCV: 92.5 fL (ref 79.3–98.0)
MONO#: 0.5 10*3/uL (ref 0.1–0.9)
MONO%: 8.3 % (ref 0.0–14.0)
NEUT#: 4.1 10*3/uL (ref 1.5–6.5)
NEUT%: 70.4 % (ref 39.0–75.0)
Platelets: 182 10*3/uL (ref 140–400)
RBC: 3.88 10*6/uL — ABNORMAL LOW (ref 4.20–5.82)
RDW: 13.1 % (ref 11.0–14.6)
WBC: 5.8 10*3/uL (ref 4.0–10.3)
lymph#: 1.2 10*3/uL (ref 0.9–3.3)

## 2014-06-14 LAB — COMPREHENSIVE METABOLIC PANEL (CC13)
ALT: 12 U/L (ref 0–55)
ANION GAP: 8 meq/L (ref 3–11)
AST: 14 U/L (ref 5–34)
Albumin: 3.9 g/dL (ref 3.5–5.0)
Alkaline Phosphatase: 70 U/L (ref 40–150)
BILIRUBIN TOTAL: 1 mg/dL (ref 0.20–1.20)
BUN: 10.3 mg/dL (ref 7.0–26.0)
CO2: 30 meq/L — AB (ref 22–29)
CREATININE: 0.9 mg/dL (ref 0.7–1.3)
Calcium: 9.6 mg/dL (ref 8.4–10.4)
Chloride: 104 mEq/L (ref 98–109)
GLUCOSE: 111 mg/dL (ref 70–140)
Potassium: 3.6 mEq/L (ref 3.5–5.1)
SODIUM: 141 meq/L (ref 136–145)
TOTAL PROTEIN: 7 g/dL (ref 6.4–8.3)

## 2014-06-14 MED ORDER — SODIUM CHLORIDE 0.9 % IV SOLN
2400.0000 mg/m2 | INTRAVENOUS | Status: DC
  Administered 2014-06-14: 4850 mg via INTRAVENOUS
  Filled 2014-06-14: qty 97

## 2014-06-14 MED ORDER — ONDANSETRON 8 MG/50ML IVPB (CHCC)
8.0000 mg | Freq: Once | INTRAVENOUS | Status: AC
  Administered 2014-06-14: 8 mg via INTRAVENOUS

## 2014-06-14 MED ORDER — DEXTROSE 5 % IV SOLN
Freq: Once | INTRAVENOUS | Status: AC
  Administered 2014-06-14: 10:00:00 via INTRAVENOUS

## 2014-06-14 MED ORDER — FLUOROURACIL CHEMO INJECTION 2.5 GM/50ML
400.0000 mg/m2 | Freq: Once | INTRAVENOUS | Status: AC
  Administered 2014-06-14: 800 mg via INTRAVENOUS
  Filled 2014-06-14: qty 16

## 2014-06-14 MED ORDER — ONDANSETRON 8 MG/NS 50 ML IVPB
INTRAVENOUS | Status: AC
  Filled 2014-06-14: qty 8

## 2014-06-14 MED ORDER — DEXAMETHASONE SODIUM PHOSPHATE 10 MG/ML IJ SOLN
INTRAMUSCULAR | Status: AC
  Filled 2014-06-14: qty 1

## 2014-06-14 MED ORDER — DEXAMETHASONE SODIUM PHOSPHATE 10 MG/ML IJ SOLN
10.0000 mg | Freq: Once | INTRAMUSCULAR | Status: AC
  Administered 2014-06-14: 10 mg via INTRAVENOUS

## 2014-06-14 MED ORDER — LEUCOVORIN CALCIUM INJECTION 350 MG
400.0000 mg/m2 | Freq: Once | INTRAMUSCULAR | Status: AC
  Administered 2014-06-14: 812 mg via INTRAVENOUS
  Filled 2014-06-14: qty 40.6

## 2014-06-14 MED ORDER — OXALIPLATIN CHEMO INJECTION 100 MG/20ML
85.0000 mg/m2 | Freq: Once | INTRAVENOUS | Status: AC
  Administered 2014-06-14: 175 mg via INTRAVENOUS
  Filled 2014-06-14: qty 35

## 2014-06-14 NOTE — Telephone Encounter (Signed)
added 12.15 appts....pt will get avs in tx room

## 2014-06-14 NOTE — Patient Instructions (Signed)
[facility] Discharge Instructions for Patients Receiving Chemotherapy  Today you received the following chemotherapy agents: Oxaliplatin, Leucovorin, 5FU  To help prevent nausea and vomiting after your treatment, we encourage you to take your nausea medication as prescribed by your physician.   If you develop nausea and vomiting that is not controlled by your nausea medication, call the clinic.   BELOW ARE SYMPTOMS THAT SHOULD BE REPORTED IMMEDIATELY:  *FEVER GREATER THAN 100.5 F  *CHILLS WITH OR WITHOUT FEVER  NAUSEA AND VOMITING THAT IS NOT CONTROLLED WITH YOUR NAUSEA MEDICATION  *UNUSUAL SHORTNESS OF BREATH  *UNUSUAL BRUISING OR BLEEDING  TENDERNESS IN MOUTH AND THROAT WITH OR WITHOUT PRESENCE OF ULCERS  *URINARY PROBLEMS  *BOWEL PROBLEMS  UNUSUAL RASH Items with * indicate a potential emergency and should be followed up as soon as possible.  Feel free to call the clinic you have any questions or concerns. The clinic phone number is [phone].

## 2014-06-14 NOTE — Progress Notes (Signed)
Hematology and Oncology Follow Up Visit  Jorge Ross [id number] 03/10/1941 73 y.o. 06/14/2014 9:52 PM [name], MDClark, Don Broach, MD   Principle Diagnosis: 73 year old with colon cancer diagnosed in September 2015. He presented with a cecal mass and found to have a T1 N1 disease. He also has T1 N0 renal cell carcinoma diagnosed at around the same time.   Prior Therapy: He is status post right colectomy done laparoscopically on 04/01/2014. He had a T1 N1 disease was 0 out of 12 lymph nodes involved. He also status post partial nephrectomy on 04/01/2014 for a papillary tumor.  Current therapy: FOLFOX adjuvant chemotherapy cycle 1 to given on 05/31/2014. Status post 1 cycle  Interim History:  Mr. Jorge Ross presents today for a follow-up visit. He reports tolerating his first cycle of adjuvant chemotherapy with FOLFOX relatively well with the exception of decreased energy as well as some nausea that was well managed with his current antiemetics. He did have an issue with hiccups a few days after chemotherapy, however they have resolved. He does not report any headaches, blurry vision, syncope. He does not report any chest pain, shortness of breath, cough. He does not report any lower extremity edema or palpitation. He does not report any nausea, vomiting, constipation, diarrhea or hematochezia. He does not report any frequency, urgency or hesitancy. He does not report any skeletal complaints. Remainder of his review of systems unremarkable.  Medications: I have reviewed the patient's current medications.  Current Outpatient Prescriptions  Medication Sig Dispense Refill  . irbesartan-hydrochlorothiazide (AVALIDE) 150-12.5 MG per tablet Take 1 tablet by mouth every morning.     . lidocaine-prilocaine (EMLA) cream Apply 1 application topically as needed.  0  . Multiple Vitamin (MULTIVITAMIN) capsule Take 1 capsule by mouth daily.      . ondansetron (ZOFRAN) 8 MG tablet Take 1 tablet (8 mg  total) by mouth every 8 (eight) hours as needed. 20 tablet 0  . vitamin E 400 UNIT capsule Take 400 Units by mouth daily.     Marland Kitchen aspirin 81 MG tablet Take 81 mg by mouth daily.      Marland Kitchen HYDROcodone-acetaminophen (NORCO/VICODIN) 5-325 MG per tablet Take 1-2 tablets by mouth every 6 (six) hours as needed for moderate pain or severe pain. 40 tablet 0   No current facility-administered medications for this visit.     Allergies:  Allergies  Allergen Reactions  . Codeine     constipation    Past Medical History, Surgical history, Social history, and Family History were reviewed and updated.   Physical Exam: Blood pressure 135/68, pulse 77, temperature 97.5 F (36.4 C), temperature source Oral, resp. rate 18, height 6' (1.829 m), weight 180 lb 6.4 oz (81.829 kg), SpO2 99 %. ECOG: 0 General appearance: alert and cooperative Head: Normocephalic, without obvious abnormality Neck: no adenopathy Lymph nodes: Cervical, supraclavicular, and axillary nodes normal. Heart:regular rate and rhythm, S1, S2 normal, no murmur, click, rub or gallop Lung:chest clear, no wheezing, rales, normal symmetric air entry, Heart exam - S1, S2 normal, no murmur, no gallop, rate regular Abdomin: soft, non-tender, without masses or organomegaly EXT:no erythema, induration, or nodules   Lab Results: Lab Results  Component Value Date   WBC 5.8 06/14/2014   HGB 12.2* 06/14/2014   HCT 35.9* 06/14/2014   MCV 92.5 06/14/2014   PLT 182 06/14/2014     Chemistry      Component Value Date/Time   NA 141 06/14/2014 0856   NA 141 05/23/2014 1549  K 3.6 06/14/2014 0856   K 4.1 05/23/2014 1549   CL 104 05/23/2014 1549   CO2 30* 06/14/2014 0856   CO2 26 05/23/2014 1549   BUN 10.3 06/14/2014 0856   BUN 17 05/23/2014 1549   CREATININE 0.9 06/14/2014 0856   CREATININE 0.78 05/23/2014 1549      Component Value Date/Time   CALCIUM 9.6 06/14/2014 0856   CALCIUM 9.4 05/23/2014 1549   ALKPHOS 70 06/14/2014 0856    ALKPHOS 55 02/15/2014 1121   AST 14 06/14/2014 0856   AST 24 02/15/2014 1121   ALT 12 06/14/2014 0856   ALT 21 02/15/2014 1121   BILITOT 1.00 06/14/2014 0856   BILITOT 1.0 02/15/2014 1121       Impression and Plan:   73 year old gentleman with the following issues:  1. 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 He is status post laparoscopic right colectomy done on 04/01/2014. He has T1 N1 disease with 1 out of 12 lymph nodes involved. He is now status post one cycle of adjuvant FOLFOX. He tolerated the first cycle relatively well with the exception of some mild nausea, hiccups and decreased energy. His laboratory data has been reviewed. He will proceed with cycle #2 today as scheduled.The  plan as for him to receive 12 cycles for a total of 6 months if he can tolerate it.  2. IV access: Port-A-Cath has been inserted without any complications.  3. Antiemetics: Prescription was given to the patient previously.  4. Renal cell carcinoma status post resection for a T1 N0 disease. The histology showed papillary type.  5. Follow-up: Will be in 2 weeks for cycle 3 of FOLFOX.  Awilda Metro E, PA-C  11/17/20159:52 PM

## 2014-06-15 NOTE — Patient Instructions (Signed)
Follow-up in 2 weeks

## 2014-06-16 ENCOUNTER — Ambulatory Visit (HOSPITAL_BASED_OUTPATIENT_CLINIC_OR_DEPARTMENT_OTHER): Payer: Medicare Other

## 2014-06-16 DIAGNOSIS — Z452 Encounter for adjustment and management of vascular access device: Secondary | ICD-10-CM

## 2014-06-16 DIAGNOSIS — C180 Malignant neoplasm of cecum: Secondary | ICD-10-CM

## 2014-06-16 DIAGNOSIS — C189 Malignant neoplasm of colon, unspecified: Secondary | ICD-10-CM

## 2014-06-16 MED ORDER — HEPARIN SOD (PORK) LOCK FLUSH 100 UNIT/ML IV SOLN
500.0000 [IU] | Freq: Once | INTRAVENOUS | Status: AC | PRN
  Administered 2014-06-16: 500 [IU]
  Filled 2014-06-16: qty 5

## 2014-06-16 MED ORDER — SODIUM CHLORIDE 0.9 % IJ SOLN
10.0000 mL | INTRAMUSCULAR | Status: DC | PRN
  Administered 2014-06-16: 10 mL
  Filled 2014-06-16: qty 10

## 2014-06-20 ENCOUNTER — Other Ambulatory Visit: Payer: Self-pay | Admitting: Medical Oncology

## 2014-06-20 ENCOUNTER — Telehealth: Payer: Self-pay | Admitting: Medical Oncology

## 2014-06-20 NOTE — Telephone Encounter (Signed)
Patient called to report having cold-like symptoms, clear mucous production and coughing. Denies fever, n/v diarrhea or other symptoms. Asking if it is ok for him to take something OTC such as mucinex, dayquil/nyquil. Review with MD, patient is ok to these OTC meds, but patient is to call office and report if having fever and or productive cough. Patient gave verbal understanding, no further questions at this time.

## 2014-06-28 ENCOUNTER — Other Ambulatory Visit (HOSPITAL_BASED_OUTPATIENT_CLINIC_OR_DEPARTMENT_OTHER): Payer: Medicare Other

## 2014-06-28 ENCOUNTER — Ambulatory Visit (HOSPITAL_BASED_OUTPATIENT_CLINIC_OR_DEPARTMENT_OTHER): Payer: Medicare Other | Admitting: Physician Assistant

## 2014-06-28 ENCOUNTER — Inpatient Hospital Stay: Payer: Medicare Other

## 2014-06-28 ENCOUNTER — Encounter: Payer: Self-pay | Admitting: Physician Assistant

## 2014-06-28 VITALS — BP 118/60 | HR 58 | Temp 98.4°F | Resp 18 | Ht 72.0 in | Wt 175.4 lb

## 2014-06-28 DIAGNOSIS — C180 Malignant neoplasm of cecum: Secondary | ICD-10-CM

## 2014-06-28 DIAGNOSIS — C189 Malignant neoplasm of colon, unspecified: Secondary | ICD-10-CM

## 2014-06-28 DIAGNOSIS — D709 Neutropenia, unspecified: Secondary | ICD-10-CM

## 2014-06-28 DIAGNOSIS — C642 Malignant neoplasm of left kidney, except renal pelvis: Secondary | ICD-10-CM

## 2014-06-28 LAB — COMPREHENSIVE METABOLIC PANEL (CC13)
ALK PHOS: 79 U/L (ref 40–150)
ALT: 14 U/L (ref 0–55)
AST: 19 U/L (ref 5–34)
Albumin: 4 g/dL (ref 3.5–5.0)
Anion Gap: 10 mEq/L (ref 3–11)
BUN: 18.5 mg/dL (ref 7.0–26.0)
CO2: 28 mEq/L (ref 22–29)
CREATININE: 0.9 mg/dL (ref 0.7–1.3)
Calcium: 9.7 mg/dL (ref 8.4–10.4)
Chloride: 101 mEq/L (ref 98–109)
Glucose: 90 mg/dl (ref 70–140)
Potassium: 3.6 mEq/L (ref 3.5–5.1)
SODIUM: 139 meq/L (ref 136–145)
TOTAL PROTEIN: 7.3 g/dL (ref 6.4–8.3)
Total Bilirubin: 1.02 mg/dL (ref 0.20–1.20)

## 2014-06-28 LAB — CBC WITH DIFFERENTIAL/PLATELET
BASO%: 0.7 % (ref 0.0–2.0)
Basophils Absolute: 0 10*3/uL (ref 0.0–0.1)
EOS%: 1.3 % (ref 0.0–7.0)
Eosinophils Absolute: 0 10*3/uL (ref 0.0–0.5)
HCT: 37.2 % — ABNORMAL LOW (ref 38.4–49.9)
HGB: 12.6 g/dL — ABNORMAL LOW (ref 13.0–17.1)
LYMPH%: 51 % — AB (ref 14.0–49.0)
MCH: 31.2 pg (ref 27.2–33.4)
MCHC: 33.9 g/dL (ref 32.0–36.0)
MCV: 92.1 fL (ref 79.3–98.0)
MONO#: 0.5 10*3/uL (ref 0.1–0.9)
MONO%: 17.4 % — AB (ref 0.0–14.0)
NEUT#: 0.9 10*3/uL — ABNORMAL LOW (ref 1.5–6.5)
NEUT%: 29.6 % — AB (ref 39.0–75.0)
Platelets: 115 10*3/uL — ABNORMAL LOW (ref 140–400)
RBC: 4.04 10*6/uL — AB (ref 4.20–5.82)
RDW: 13.1 % (ref 11.0–14.6)
WBC: 3 10*3/uL — ABNORMAL LOW (ref 4.0–10.3)
lymph#: 1.5 10*3/uL (ref 0.9–3.3)

## 2014-06-28 MED ORDER — ONDANSETRON HCL 8 MG PO TABS: 8.0000 mg | ORAL_TABLET | Freq: Three times a day (TID) | ORAL | Status: DC | PRN

## 2014-06-28 NOTE — Patient Instructions (Signed)
Your  neutrophil count is too low to proceed with chemotherapy today. Follow up in 2 weeks, prior to your next scheduled cycle of chemotherapy

## 2014-06-28 NOTE — Progress Notes (Signed)
Hematology and Oncology Follow Up Visit  Jorge Ross [id number] 1941/04/06 73 y.o. 06/28/2014 2:08 PM [name], MDClark, Don Broach, MD   Principle Diagnosis: 73 year old with colon cancer diagnosed in September 2015. He presented with a cecal mass and found to have a T1 N1 disease. He also has T1 N0 renal cell carcinoma diagnosed at around the same time.   Prior Therapy: He is status post right colectomy done laparoscopically on 04/01/2014. He had a T1 N1 disease was 0 out of 12 lymph nodes involved. He also status post partial nephrectomy on 04/01/2014 for a papillary tumor.  Current therapy: FOLFOX adjuvant chemotherapy cycle 1 to given on 05/31/2014. Status post 2 cycles  Interim History:  Jorge Ross presents today for a follow-up visit prior to proceeding with cycle #3. Tolerating his chemotherapy relatively well. He reports that he had cold symptoms with cough productive of clear secretions last week. He was evaluated by his primary care physician and given a hydrocodone cough syrup which helped tremendously. He reports having low-grade fever during this time. Over the last couple days he reports that his stool has been looser than usual but denied frank diarrhea. He's had no blood in his stool. He reports nausea as well as decreased appetite. He requests a refill of his Zofran which adequately controls his nausea.  He does not report any headaches, blurry vision, syncope. He does not report any chest pain, shortness of breath, cough. He does not report any lower extremity edema or palpitation. He does not report any nausea, vomiting, constipation, diarrhea or hematochezia. He does not report any frequency, urgency or hesitancy. He does not report any skeletal complaints. Remainder of his review of systems unremarkable.  Medications: I have reviewed the patient's current medications.  Current Outpatient Prescriptions  Medication Sig Dispense Refill  . baclofen (LIORESAL) 10 MG tablet    0  . chlorpheniramine-HYDROcodone (TUSSIONEX) 10-8 MG/5ML LQCR   0  . HYDROcodone-acetaminophen (NORCO/VICODIN) 5-325 MG per tablet Take 1-2 tablets by mouth every 6 (six) hours as needed for moderate pain or severe pain. 40 tablet 0  . irbesartan-hydrochlorothiazide (AVALIDE) 150-12.5 MG per tablet Take 1 tablet by mouth every morning.     . lidocaine-prilocaine (EMLA) cream Apply 1 application topically as needed.  0  . Multiple Vitamin (MULTIVITAMIN) capsule Take 1 capsule by mouth daily.      . ondansetron (ZOFRAN) 8 MG tablet Take 1 tablet (8 mg total) by mouth every 8 (eight) hours as needed. 20 tablet 0  . vitamin E 400 UNIT capsule Take 400 Units by mouth daily.     Marland Kitchen aspirin 81 MG tablet Take 81 mg by mouth daily.       No current facility-administered medications for this visit.     Allergies:  Allergies  Allergen Reactions  . Codeine     constipation    Past Medical History, Surgical history, Social history, and Family History were reviewed and updated.   Physical Exam: Blood pressure 118/60, pulse 58, temperature 98.4 F (36.9 C), temperature source Oral, resp. rate 18, height 6' (1.829 m), weight 175 lb 6.4 oz (79.561 kg), SpO2 100 %. ECOG: 0 General appearance: alert and cooperative Head: Normocephalic, without obvious abnormality Neck: no adenopathy Lymph nodes: Cervical, supraclavicular, and axillary nodes normal. Heart:regular rate and rhythm, S1, S2 normal, no murmur, click, rub or gallop Lung:chest clear, no wheezing, rales, normal symmetric air entry, Heart exam - S1, S2 normal, no murmur, no gallop, rate regular Abdomin: soft,  non-tender, without masses or organomegaly EXT:no erythema, induration, or nodules   Lab Results: Lab Results  Component Value Date   WBC 3.0* 06/28/2014   HGB 12.6* 06/28/2014   HCT 37.2* 06/28/2014   MCV 92.1 06/28/2014   PLT 115* 06/28/2014     Chemistry      Component Value Date/Time   NA 139 06/28/2014 0851   NA 141  05/23/2014 1549   K 3.6 06/28/2014 0851   K 4.1 05/23/2014 1549   CL 104 05/23/2014 1549   CO2 28 06/28/2014 0851   CO2 26 05/23/2014 1549   BUN 18.5 06/28/2014 0851   BUN 17 05/23/2014 1549   CREATININE 0.9 06/28/2014 0851   CREATININE 0.78 05/23/2014 1549      Component Value Date/Time   CALCIUM 9.7 06/28/2014 0851   CALCIUM 9.4 05/23/2014 1549   ALKPHOS 79 06/28/2014 0851   ALKPHOS 55 02/15/2014 1121   AST 19 06/28/2014 0851   AST 24 02/15/2014 1121   ALT 14 06/28/2014 0851   ALT 21 02/15/2014 1121   BILITOT 1.02 06/28/2014 0851   BILITOT 1.0 02/15/2014 1121       Impression and Plan:   73 year old gentleman with the following issues:  1. 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 He is status post laparoscopic right colectomy done on 04/01/2014. He has T1 N1 disease with 1 out of 12 lymph nodes involved. He is now status post 2 cycles of adjuvant FOLFOX. Overall he is tolerating his chemotherapy with FOLFOX relatively well. His laboratory data has been reviewed. His ANC is too low at 0.9 to proceed with cycle #3 today as scheduled. The patient was reviewed with Dr. Alen Blew. We will skip cycle #3 due to the neutropenia. Neutropenic precautions were reviewed with Jorge Ross, who voiced understanding. The  plan as for him to receive 12 cycles for a total of 6 months if he can tolerate it.  2. IV access: Port-A-Cath has been inserted without any complications.  3. Antiemetics: Prescription was given to the patient previously.  4. Renal cell carcinoma status post resection for a T1 N0 disease. The histology showed papillary type.  5. Follow-up: Will be in 2 weeks for cycle 4 of FOLFOX.  Awilda Metro E, PA-C  12/1/20152:08 PM

## 2014-07-04 ENCOUNTER — Telehealth: Payer: Self-pay | Admitting: *Deleted

## 2014-07-04 NOTE — Telephone Encounter (Signed)
Pt called and had several questions about low blood counts from last week.  Pt did not receive chemo due to low ANC.  Reinforced neutropenic precautions with pt - avoid crowded areas, drink lots of fluids as tolerated, avoid eating raw foods, good hand hygiene.  Pt understood to call office with any new problems.

## 2014-07-12 ENCOUNTER — Ambulatory Visit (HOSPITAL_BASED_OUTPATIENT_CLINIC_OR_DEPARTMENT_OTHER): Payer: Medicare Other

## 2014-07-12 ENCOUNTER — Other Ambulatory Visit (HOSPITAL_BASED_OUTPATIENT_CLINIC_OR_DEPARTMENT_OTHER): Payer: Medicare Other

## 2014-07-12 ENCOUNTER — Encounter: Payer: Self-pay | Admitting: Physician Assistant

## 2014-07-12 ENCOUNTER — Telehealth: Payer: Self-pay | Admitting: Physician Assistant

## 2014-07-12 ENCOUNTER — Ambulatory Visit (HOSPITAL_BASED_OUTPATIENT_CLINIC_OR_DEPARTMENT_OTHER): Payer: Medicare Other | Admitting: Physician Assistant

## 2014-07-12 VITALS — BP 144/67 | HR 49 | Temp 97.8°F | Resp 19 | Ht 72.0 in | Wt 180.2 lb

## 2014-07-12 DIAGNOSIS — N2889 Other specified disorders of kidney and ureter: Secondary | ICD-10-CM

## 2014-07-12 DIAGNOSIS — C189 Malignant neoplasm of colon, unspecified: Secondary | ICD-10-CM

## 2014-07-12 DIAGNOSIS — C180 Malignant neoplasm of cecum: Secondary | ICD-10-CM

## 2014-07-12 DIAGNOSIS — Z5111 Encounter for antineoplastic chemotherapy: Secondary | ICD-10-CM

## 2014-07-12 DIAGNOSIS — C649 Malignant neoplasm of unspecified kidney, except renal pelvis: Secondary | ICD-10-CM

## 2014-07-12 LAB — CBC WITH DIFFERENTIAL/PLATELET
BASO%: 0.2 % (ref 0.0–2.0)
Basophils Absolute: 0 10*3/uL (ref 0.0–0.1)
EOS%: 0.7 % (ref 0.0–7.0)
Eosinophils Absolute: 0 10*3/uL (ref 0.0–0.5)
HCT: 37.2 % — ABNORMAL LOW (ref 38.4–49.9)
HGB: 12.5 g/dL — ABNORMAL LOW (ref 13.0–17.1)
LYMPH#: 1.7 10*3/uL (ref 0.9–3.3)
LYMPH%: 39.7 % (ref 14.0–49.0)
MCH: 31.2 pg (ref 27.2–33.4)
MCHC: 33.6 g/dL (ref 32.0–36.0)
MCV: 92.8 fL (ref 79.3–98.0)
MONO#: 0.6 10*3/uL (ref 0.1–0.9)
MONO%: 13.1 % (ref 0.0–14.0)
NEUT#: 2 10*3/uL (ref 1.5–6.5)
NEUT%: 46.3 % (ref 39.0–75.0)
PLATELETS: 135 10*3/uL — AB (ref 140–400)
RBC: 4.01 10*6/uL — ABNORMAL LOW (ref 4.20–5.82)
RDW: 13.7 % (ref 11.0–14.6)
WBC: 4.2 10*3/uL (ref 4.0–10.3)

## 2014-07-12 LAB — COMPREHENSIVE METABOLIC PANEL (CC13)
ALBUMIN: 3.9 g/dL (ref 3.5–5.0)
ALK PHOS: 78 U/L (ref 40–150)
ALT: 16 U/L (ref 0–55)
AST: 19 U/L (ref 5–34)
Anion Gap: 12 mEq/L — ABNORMAL HIGH (ref 3–11)
BILIRUBIN TOTAL: 0.74 mg/dL (ref 0.20–1.20)
BUN: 15.4 mg/dL (ref 7.0–26.0)
CO2: 24 mEq/L (ref 22–29)
Calcium: 9.3 mg/dL (ref 8.4–10.4)
Chloride: 103 mEq/L (ref 98–109)
Creatinine: 1 mg/dL (ref 0.7–1.3)
EGFR: 83 mL/min/{1.73_m2} — ABNORMAL LOW (ref >90)
Glucose: 115 mg/dl (ref 70–140)
Potassium: 3.8 mEq/L (ref 3.5–5.1)
SODIUM: 138 meq/L (ref 136–145)
TOTAL PROTEIN: 7 g/dL (ref 6.4–8.3)

## 2014-07-12 MED ORDER — LEUCOVORIN CALCIUM INJECTION 350 MG
400.0000 mg/m2 | Freq: Once | INTRAMUSCULAR | Status: AC
  Administered 2014-07-12: 812 mg via INTRAVENOUS
  Filled 2014-07-12: qty 40.6

## 2014-07-12 MED ORDER — ONDANSETRON 8 MG/50ML IVPB (CHCC)
8.0000 mg | Freq: Once | INTRAVENOUS | Status: AC
  Administered 2014-07-12: 8 mg via INTRAVENOUS

## 2014-07-12 MED ORDER — SODIUM CHLORIDE 0.9 % IV SOLN
2400.0000 mg/m2 | INTRAVENOUS | Status: DC
  Administered 2014-07-12: 4850 mg via INTRAVENOUS
  Filled 2014-07-12: qty 97

## 2014-07-12 MED ORDER — DEXTROSE 5 % IV SOLN
Freq: Once | INTRAVENOUS | Status: AC
  Administered 2014-07-12: 10:00:00 via INTRAVENOUS

## 2014-07-12 MED ORDER — OXALIPLATIN CHEMO INJECTION 100 MG/20ML
85.0000 mg/m2 | Freq: Once | INTRAVENOUS | Status: AC
  Administered 2014-07-12: 175 mg via INTRAVENOUS
  Filled 2014-07-12: qty 35

## 2014-07-12 MED ORDER — DEXAMETHASONE SODIUM PHOSPHATE 10 MG/ML IJ SOLN
INTRAMUSCULAR | Status: AC
  Filled 2014-07-12: qty 1

## 2014-07-12 MED ORDER — ONDANSETRON 8 MG/NS 50 ML IVPB
INTRAVENOUS | Status: AC
  Filled 2014-07-12: qty 8

## 2014-07-12 MED ORDER — FLUOROURACIL CHEMO INJECTION 2.5 GM/50ML
400.0000 mg/m2 | Freq: Once | INTRAVENOUS | Status: AC
  Administered 2014-07-12: 800 mg via INTRAVENOUS
  Filled 2014-07-12: qty 16

## 2014-07-12 MED ORDER — DEXAMETHASONE SODIUM PHOSPHATE 10 MG/ML IJ SOLN
10.0000 mg | Freq: Once | INTRAMUSCULAR | Status: AC
  Administered 2014-07-12: 10 mg via INTRAVENOUS

## 2014-07-12 NOTE — Patient Instructions (Signed)
[facility] Discharge Instructions for Patients Receiving Chemotherapy  Today you received the following chemotherapy agents 73fu,oxaliplatin and leucovorin To help prevent nausea and vomiting after your treatment, we encourage you to take your nausea medication as prescribed.   If you develop nausea and vomiting that is not controlled by your nausea medication, call the clinic.   BELOW ARE SYMPTOMS THAT SHOULD BE REPORTED IMMEDIATELY:  *FEVER GREATER THAN 100.5 F  *CHILLS WITH OR WITHOUT FEVER  NAUSEA AND VOMITING THAT IS NOT CONTROLLED WITH YOUR NAUSEA MEDICATION  *UNUSUAL SHORTNESS OF BREATH  *UNUSUAL BRUISING OR BLEEDING  TENDERNESS IN MOUTH AND THROAT WITH OR WITHOUT PRESENCE OF ULCERS  *URINARY PROBLEMS  *BOWEL PROBLEMS  UNUSUAL RASH Items with * indicate a potential emergency and should be followed up as soon as possible.  Feel free to call the clinic you have any questions or concerns. The clinic phone number is [phone].

## 2014-07-12 NOTE — Progress Notes (Signed)
Hematology and Oncology Follow Up Visit  Jorge Ross [id number] 07/23/1941 73 y.o. 07/12/2014 11:02 AM [name], MDClark, Don Broach, MD   Principle Diagnosis: 73 year old with colon cancer diagnosed in September 2015. He presented with a cecal mass and found to have a T1 N1 disease. He also has T1 N0 renal cell carcinoma diagnosed at around the same time.   Prior Therapy: He is status post right colectomy done laparoscopically on 04/01/2014. He had a T1 N1 disease was 0 out of 12 lymph nodes involved. He also status post partial nephrectomy on 04/01/2014 for a papillary tumor.  Current therapy: FOLFOX adjuvant chemotherapy cycle 1 to given on 05/31/2014. Status post 2 cycles  Interim History:  Mr. Jorge Ross presents today for a follow-up visit prior to proceeding with his next cycle of chemotherapy. Cycle #3 was skipped secondary to neutropenia. He presents today for reevaluation prior to proceeding with numerical cycle #4 however we'll be his actual third cycle of chemotherapy. All his symptoms have resolved terms of his grip respiratory illness as well as loose stool. Overall he is tolerating his chemotherapy relatively well.  He's had no blood in his stool. He reports nausea as well as decreased appetite.  He does not report any headaches, blurry vision, syncope. He does not report any chest pain, shortness of breath, cough. He does not report any lower extremity edema or palpitation. He does not report any nausea, vomiting, constipation, diarrhea or hematochezia. He does not report any frequency, urgency or hesitancy. He does not report any skeletal complaints. Remainder of his review of systems unremarkable.  Medications: I have reviewed the patient's current medications.  Current Outpatient Prescriptions  Medication Sig Dispense Refill  . irbesartan-hydrochlorothiazide (AVALIDE) 150-12.5 MG per tablet Take 1 tablet by mouth every morning.     . lidocaine-prilocaine (EMLA) cream Apply  1 application topically as needed.  0  . Multiple Vitamin (MULTIVITAMIN) capsule Take 1 capsule by mouth daily.      . ondansetron (ZOFRAN) 8 MG tablet Take 1 tablet (8 mg total) by mouth every 8 (eight) hours as needed. 20 tablet 3  . vitamin E 400 UNIT capsule Take 400 Units by mouth daily.     Marland Kitchen aspirin 81 MG tablet Take 81 mg by mouth daily.      . baclofen (LIORESAL) 10 MG tablet   0  . chlorpheniramine-HYDROcodone (TUSSIONEX) 10-8 MG/5ML LQCR   0  . HYDROcodone-acetaminophen (NORCO/VICODIN) 5-325 MG per tablet Take 1-2 tablets by mouth every 6 (six) hours as needed for moderate pain or severe pain. (Patient not taking: Reported on 07/12/2014) 40 tablet 0   No current facility-administered medications for this visit.   Facility-Administered Medications Ordered in Other Visits  Medication Dose Route Frequency Provider Last Rate Last Dose  . fluorouracil (ADRUCIL) 4,850 mg in sodium chloride 0.9 % 150 mL chemo infusion  2,400 mg/m2 (Treatment Plan Actual) Intravenous 1 day or 1 dose Wyatt Portela, MD      . fluorouracil (ADRUCIL) chemo injection 800 mg  400 mg/m2 (Treatment Plan Actual) Intravenous Once Wyatt Portela, MD      . leucovorin 812 mg in dextrose 5 % 250 mL infusion  400 mg/m2 (Treatment Plan Actual) Intravenous Once Wyatt Portela, MD      . oxaliplatin (ELOXATIN) 175 mg in dextrose 5 % 500 mL chemo infusion  85 mg/m2 (Treatment Plan Actual) Intravenous Once Wyatt Portela, MD         Allergies:  Allergies  Allergen Reactions  . Codeine     constipation    Past Medical History, Surgical history, Social history, and Family History were reviewed and updated.   Physical Exam: Blood pressure 144/67, pulse 49, temperature 97.8 F (36.6 C), temperature source Oral, resp. rate 19, height 6' (1.829 m), weight 180 lb 3.2 oz (81.738 kg). ECOG: 0 General appearance: alert and cooperative Head: Normocephalic, without obvious abnormality Neck: no adenopathy Lymph nodes:  Cervical, supraclavicular, and axillary nodes normal. Heart:regular rate and rhythm, S1, S2 normal, no murmur, click, rub or gallop Lung:chest clear, no wheezing, rales, normal symmetric air entry, Heart exam - S1, S2 normal, no murmur, no gallop, rate regular Abdomin: soft, non-tender, without masses or organomegaly EXT:no erythema, induration, or nodules   Lab Results: Lab Results  Component Value Date   WBC 4.2 07/12/2014   HGB 12.5* 07/12/2014   HCT 37.2* 07/12/2014   MCV 92.8 07/12/2014   PLT 135* 07/12/2014     Chemistry      Component Value Date/Time   NA 138 07/12/2014 0854   NA 141 05/23/2014 1549   K 3.8 07/12/2014 0854   K 4.1 05/23/2014 1549   CL 104 05/23/2014 1549   CO2 24 07/12/2014 0854   CO2 26 05/23/2014 1549   BUN 15.4 07/12/2014 0854   BUN 17 05/23/2014 1549   CREATININE 1.0 07/12/2014 0854   CREATININE 0.78 05/23/2014 1549      Component Value Date/Time   CALCIUM 9.3 07/12/2014 0854   CALCIUM 9.4 05/23/2014 1549   ALKPHOS 78 07/12/2014 0854   ALKPHOS 55 02/15/2014 1121   AST 19 07/12/2014 0854   AST 24 02/15/2014 1121   ALT 16 07/12/2014 0854   ALT 21 02/15/2014 1121   BILITOT 0.74 07/12/2014 0854   BILITOT 1.0 02/15/2014 1121       Impression and Plan:   73 year old gentleman with the following issues:  1. 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 He is status post laparoscopic right colectomy done on 04/01/2014. He has T1 N1 disease with 1 out of 12 lymph nodes involved. He is now status post 2 cycles of adjuvant FOLFOX. Overall he is tolerating his chemotherapy with FOLFOX relatively well. His laboratory data has been reviewed. His ANC has totally recovered, it is 2.0 today with a total white count of 4.2. He'll proceed with chemotherapy today as scheduled. He will follow-up in 2 weeks and receive a chemotherapy as long as his counts are within treatable range. Today will be numerical cycle #4 however, this will be his  third actual cycle of chemotherapy that he will receive.  The patient was reviewed with Dr. Alen Blew.  The  plan as for him to receive 12 cycles for a total of 6 months if he can tolerate it.  2. IV access: Port-A-Cath has been inserted without any complications.  3. Antiemetics: Patient has prescription for Zofran available.  4. Renal cell carcinoma status post resection for a T1 N0 disease. The histology showed papillary type.  5. Follow-up: Will be in 2 weeks for cycle 5 ( 4th actual received) of FOLFOX.  Awilda Metro E, PA-C  12/15/201511:02 AM

## 2014-07-12 NOTE — Telephone Encounter (Signed)
Gave avs & cal for Dec/Jan. Sent mess to sch tx. Appt time for 07/26/14 confirm w/ [facility], also 08/09/14 time adjust due to adlready double booked [facility] aware of new time.

## 2014-07-12 NOTE — Patient Instructions (Signed)
Follow up in 2 weeks, prior to your next scheduled cycle of chemotherapy 

## 2014-07-14 ENCOUNTER — Ambulatory Visit (HOSPITAL_BASED_OUTPATIENT_CLINIC_OR_DEPARTMENT_OTHER): Payer: Medicare Other

## 2014-07-14 DIAGNOSIS — C189 Malignant neoplasm of colon, unspecified: Secondary | ICD-10-CM

## 2014-07-14 DIAGNOSIS — Z452 Encounter for adjustment and management of vascular access device: Secondary | ICD-10-CM

## 2014-07-14 DIAGNOSIS — C180 Malignant neoplasm of cecum: Secondary | ICD-10-CM

## 2014-07-14 MED ORDER — SODIUM CHLORIDE 0.9 % IJ SOLN
10.0000 mL | INTRAMUSCULAR | Status: DC | PRN
  Administered 2014-07-14: 10 mL
  Filled 2014-07-14: qty 10

## 2014-07-14 MED ORDER — HEPARIN SOD (PORK) LOCK FLUSH 100 UNIT/ML IV SOLN
500.0000 [IU] | Freq: Once | INTRAVENOUS | Status: AC | PRN
  Administered 2014-07-14: 500 [IU]
  Filled 2014-07-14: qty 5

## 2014-07-26 ENCOUNTER — Telehealth: Payer: Self-pay | Admitting: Oncology

## 2014-07-26 ENCOUNTER — Encounter: Payer: Self-pay | Admitting: Physician Assistant

## 2014-07-26 ENCOUNTER — Ambulatory Visit (HOSPITAL_BASED_OUTPATIENT_CLINIC_OR_DEPARTMENT_OTHER): Payer: Medicare Other | Admitting: Physician Assistant

## 2014-07-26 ENCOUNTER — Other Ambulatory Visit: Payer: Self-pay | Admitting: Oncology

## 2014-07-26 ENCOUNTER — Ambulatory Visit (HOSPITAL_BASED_OUTPATIENT_CLINIC_OR_DEPARTMENT_OTHER): Payer: Medicare Other

## 2014-07-26 ENCOUNTER — Other Ambulatory Visit (HOSPITAL_BASED_OUTPATIENT_CLINIC_OR_DEPARTMENT_OTHER): Payer: Medicare Other

## 2014-07-26 VITALS — BP 127/72 | HR 63 | Temp 97.6°F | Resp 18 | Ht 72.0 in | Wt 177.2 lb

## 2014-07-26 DIAGNOSIS — C642 Malignant neoplasm of left kidney, except renal pelvis: Secondary | ICD-10-CM

## 2014-07-26 DIAGNOSIS — Z5111 Encounter for antineoplastic chemotherapy: Secondary | ICD-10-CM

## 2014-07-26 DIAGNOSIS — C189 Malignant neoplasm of colon, unspecified: Secondary | ICD-10-CM

## 2014-07-26 DIAGNOSIS — C779 Secondary and unspecified malignant neoplasm of lymph node, unspecified: Secondary | ICD-10-CM

## 2014-07-26 DIAGNOSIS — C180 Malignant neoplasm of cecum: Secondary | ICD-10-CM

## 2014-07-26 DIAGNOSIS — N2889 Other specified disorders of kidney and ureter: Secondary | ICD-10-CM

## 2014-07-26 DIAGNOSIS — D701 Agranulocytosis secondary to cancer chemotherapy: Secondary | ICD-10-CM

## 2014-07-26 DIAGNOSIS — T451X5A Adverse effect of antineoplastic and immunosuppressive drugs, initial encounter: Principal | ICD-10-CM

## 2014-07-26 LAB — COMPREHENSIVE METABOLIC PANEL (CC13)
ALT: 12 U/L (ref 0–55)
AST: 19 U/L (ref 5–34)
Albumin: 4 g/dL (ref 3.5–5.0)
Alkaline Phosphatase: 71 U/L (ref 40–150)
Anion Gap: 9 mEq/L (ref 3–11)
BUN: 18.4 mg/dL (ref 7.0–26.0)
CO2: 28 mEq/L (ref 22–29)
Calcium: 9.3 mg/dL (ref 8.4–10.4)
Chloride: 102 mEq/L (ref 98–109)
Creatinine: 0.9 mg/dL (ref 0.7–1.3)
EGFR: >90 mL/min/{1.73_m2} (ref >90)
Glucose: 106 mg/dl (ref 70–140)
Potassium: 3.8 mEq/L (ref 3.5–5.1)
Sodium: 139 mEq/L (ref 136–145)
Total Bilirubin: 1.17 mg/dL (ref 0.20–1.20)
Total Protein: 6.9 g/dL (ref 6.4–8.3)

## 2014-07-26 LAB — CBC WITH DIFFERENTIAL/PLATELET
BASO%: 0.7 % (ref 0.0–2.0)
Basophils Absolute: 0 10*3/uL (ref 0.0–0.1)
EOS%: 1.7 % (ref 0.0–7.0)
Eosinophils Absolute: 0.1 10*3/uL (ref 0.0–0.5)
HEMATOCRIT: 36.8 % — AB (ref 38.4–49.9)
HGB: 12.7 g/dL — ABNORMAL LOW (ref 13.0–17.1)
LYMPH%: 44.7 % (ref 14.0–49.0)
MCH: 31.5 pg (ref 27.2–33.4)
MCHC: 34.5 g/dL (ref 32.0–36.0)
MCV: 91.3 fL (ref 79.3–98.0)
MONO#: 0.3 10*3/uL (ref 0.1–0.9)
MONO%: 11.7 % (ref 0.0–14.0)
NEUT#: 1.2 10*3/uL — ABNORMAL LOW (ref 1.5–6.5)
NEUT%: 41.2 % (ref 39.0–75.0)
Platelets: 121 10*3/uL — ABNORMAL LOW (ref 140–400)
RBC: 4.03 10*6/uL — ABNORMAL LOW (ref 4.20–5.82)
RDW: 13.7 % (ref 11.0–14.6)
WBC: 2.9 10*3/uL — ABNORMAL LOW (ref 4.0–10.3)
lymph#: 1.3 10*3/uL (ref 0.9–3.3)

## 2014-07-26 MED ORDER — ONDANSETRON 8 MG/50ML IVPB (CHCC)
8.0000 mg | Freq: Once | INTRAVENOUS | Status: AC
  Administered 2014-07-26: 8 mg via INTRAVENOUS

## 2014-07-26 MED ORDER — DEXTROSE 5 % IV SOLN
400.0000 mg/m2 | Freq: Once | INTRAVENOUS | Status: AC
  Administered 2014-07-26: 812 mg via INTRAVENOUS
  Filled 2014-07-26: qty 40.6

## 2014-07-26 MED ORDER — DEXTROSE 5 % IV SOLN
Freq: Once | INTRAVENOUS | Status: AC
  Administered 2014-07-26: 13:00:00 via INTRAVENOUS

## 2014-07-26 MED ORDER — ONDANSETRON 8 MG/NS 50 ML IVPB
INTRAVENOUS | Status: AC
  Filled 2014-07-26: qty 8

## 2014-07-26 MED ORDER — DEXAMETHASONE SODIUM PHOSPHATE 10 MG/ML IJ SOLN
10.0000 mg | Freq: Once | INTRAMUSCULAR | Status: AC
  Administered 2014-07-26: 10 mg via INTRAVENOUS

## 2014-07-26 MED ORDER — SODIUM CHLORIDE 0.9 % IV SOLN
2400.0000 mg/m2 | INTRAVENOUS | Status: DC
  Administered 2014-07-26: 4850 mg via INTRAVENOUS
  Filled 2014-07-26: qty 97

## 2014-07-26 MED ORDER — DEXAMETHASONE SODIUM PHOSPHATE 10 MG/ML IJ SOLN
INTRAMUSCULAR | Status: AC
  Filled 2014-07-26: qty 1

## 2014-07-26 MED ORDER — OXALIPLATIN CHEMO INJECTION 100 MG/20ML
85.0000 mg/m2 | Freq: Once | INTRAVENOUS | Status: AC
  Administered 2014-07-26: 175 mg via INTRAVENOUS
  Filled 2014-07-26: qty 35

## 2014-07-26 MED ORDER — FLUOROURACIL CHEMO INJECTION 2.5 GM/50ML
400.0000 mg/m2 | Freq: Once | INTRAVENOUS | Status: AC
  Administered 2014-07-26: 800 mg via INTRAVENOUS
  Filled 2014-07-26: qty 16

## 2014-07-26 NOTE — Progress Notes (Signed)
Hematology and Oncology Follow Up Visit  Jorge Ross [id number] Sep 09, 1940 73 y.o. 07/26/2014 6:22 PM [name], MDClark, Don Broach, MD   Principle Diagnosis: 73 year old with colon cancer diagnosed in September 2015. He presented with a cecal mass and found to have a T1 N1 disease. He also has T1 N0 renal cell carcinoma diagnosed at around the same time.   Prior Therapy: He is status post right colectomy done laparoscopically on 04/01/2014. He had a T1 N1 disease was 0 out of 12 lymph nodes involved. He also status post partial nephrectomy on 04/01/2014 for a papillary tumor.  Current therapy: FOLFOX adjuvant chemotherapy cycle 1 to given on 05/31/2014. Status post 3 cycles  Interim History:  [name] presents today for a follow-up visit prior to proceeding with his next cycle of chemotherapy. He is accompanied today by his wife. He reports he had an enjoyable Christmas holiday. Cycle #3 was skipped secondary to neutropenia. He presents today for reevaluation prior to proceeding with numerical cycle #4 however we'll be his actual fourth cycle of chemotherapy. Overall he is tolerating his chemotherapy relatively well.  He's had no blood in his stool. He reports nausea as well as decreased appetite.  He does not report any headaches, blurry vision, syncope. He does not report any chest pain, shortness of breath, cough. He does not report any lower extremity edema or palpitation. He does not report any nausea, vomiting, constipation, diarrhea or hematochezia. He does not report any frequency, urgency or hesitancy. He does not report any skeletal complaints. Remainder of his review of systems unremarkable.  Medications: I have reviewed the patient's current medications.  Current Outpatient Prescriptions  Medication Sig Dispense Refill  . irbesartan-hydrochlorothiazide (AVALIDE) 150-12.5 MG per tablet Take 1 tablet by mouth every morning.     . Multiple Vitamin (MULTIVITAMIN) capsule Take 1  capsule by mouth daily.      . ondansetron (ZOFRAN) 8 MG tablet Take 1 tablet (8 mg total) by mouth every 8 (eight) hours as needed. 20 tablet 3  . vitamin E 400 UNIT capsule Take 400 Units by mouth daily.     Marland Kitchen aspirin 81 MG tablet Take 81 mg by mouth daily.      . baclofen (LIORESAL) 10 MG tablet   0  . chlorpheniramine-HYDROcodone (TUSSIONEX) 10-8 MG/5ML LQCR   0  . HYDROcodone-acetaminophen (NORCO/VICODIN) 5-325 MG per tablet Take 1-2 tablets by mouth every 6 (six) hours as needed for moderate pain or severe pain. (Patient not taking: Reported on 07/26/2014) 40 tablet 0  . lidocaine-prilocaine (EMLA) cream Apply 1 application topically as needed.  0   No current facility-administered medications for this visit.   Facility-Administered Medications Ordered in Other Visits  Medication Dose Route Frequency Provider Last Rate Last Dose  . fluorouracil (ADRUCIL) 4,850 mg in sodium chloride 0.9 % 150 mL chemo infusion  2,400 mg/m2 (Treatment Plan Actual) Intravenous 1 day or 1 dose Wyatt Portela, MD   4,850 mg at 07/26/14 1530     Allergies:  Allergies  Allergen Reactions  . Codeine     constipation    Past Medical History, Surgical history, Social history, and Family History were reviewed and updated.   Physical Exam: Blood pressure 127/72, pulse 63, temperature 97.6 F (36.4 C), temperature source Oral, resp. rate 18, height 6' (1.829 m), weight 177 lb 3.2 oz (80.377 kg), SpO2 98 %. ECOG: 0 General appearance: alert and cooperative Head: Normocephalic, without obvious abnormality Neck: no adenopathy Lymph nodes: Cervical,  supraclavicular, and axillary nodes normal. Heart:regular rate and rhythm, S1, S2 normal, no murmur, click, rub or gallop Lung:chest clear, no wheezing, rales, normal symmetric air entry, Heart exam - S1, S2 normal, no murmur, no gallop, rate regular Abdomin: soft, non-tender, without masses or organomegaly EXT:no erythema, induration, or nodules   Lab  Results: Lab Results  Component Value Date   WBC 2.9* 07/26/2014   HGB 12.7* 07/26/2014   HCT 36.8* 07/26/2014   MCV 91.3 07/26/2014   PLT 121* 07/26/2014     Chemistry      Component Value Date/Time   NA 139 07/26/2014 1039   NA 141 05/23/2014 1549   K 3.8 07/26/2014 1039   K 4.1 05/23/2014 1549   CL 104 05/23/2014 1549   CO2 28 07/26/2014 1039   CO2 26 05/23/2014 1549   BUN 18.4 07/26/2014 1039   BUN 17 05/23/2014 1549   CREATININE 0.9 07/26/2014 1039   CREATININE 0.78 05/23/2014 1549      Component Value Date/Time   CALCIUM 9.3 07/26/2014 1039   CALCIUM 9.4 05/23/2014 1549   ALKPHOS 71 07/26/2014 1039   ALKPHOS 55 02/15/2014 1121   AST 19 07/26/2014 1039   AST 24 02/15/2014 1121   ALT 12 07/26/2014 1039   ALT 21 02/15/2014 1121   BILITOT 1.17 07/26/2014 1039   BILITOT 1.0 02/15/2014 1121       Impression and Plan:   73 year old gentleman with the following issues:  1. 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 He is status post laparoscopic right colectomy done on 04/01/2014. He has T1 N1 disease with 1 out of 12 lymph nodes involved. He is now status post 2 cycles of adjuvant FOLFOX. Overall he is tolerating his chemotherapy with FOLFOX relatively well. His laboratory data has been reviewed. His ANC is slightly low today at 1.2.  He'll proceed with chemotherapy today as scheduled, however he will receive Neulasta for neutrophil support when he returns to have his pump discontinued.Marland Kitchen He will follow-up in 2 weeks and receive  chemotherapy as long as his counts are within treatable range. Today will be numerical cycle #5 however, this will be his fourth actual cycle of chemotherapy that he will receive.  The patient was reviewed with Dr. Alen Blew.  The  plan as for him to receive 12 cycles for a total of 6 months if he can tolerate it.  2. IV access: Port-A-Cath has been inserted without any complications.  3. Antiemetics: Patient has prescription for  Zofran available.  4. Renal cell carcinoma status post resection for a T1 N0 disease. The histology showed papillary type.  5. Follow-up: Will be in 2 weeks for cycle 6 ( 5th actual received) of FOLFOX.  Carlton Adam, PA-C  12/29/20156:22 PM

## 2014-07-26 NOTE — Telephone Encounter (Signed)
gv and printed appt sched and avs for pt  °

## 2014-07-26 NOTE — Progress Notes (Signed)
Per Rubin Payor, charge RN, okay to treat today despite labs.

## 2014-07-26 NOTE — Progress Notes (Signed)
5 fu 46 hour bag verified by me and Adria Dill.

## 2014-07-26 NOTE — Patient Instructions (Signed)
[facility] Discharge Instructions for Patients Receiving Chemotherapy  Today you received the following chemotherapy agents: Oxaliplatin, Leucovorin, and Adrucil.  To help prevent nausea and vomiting after your treatment, we encourage you to take your nausea medication as prescribed.   If you develop nausea and vomiting that is not controlled by your nausea medication, call the clinic.   BELOW ARE SYMPTOMS THAT SHOULD BE REPORTED IMMEDIATELY:  *FEVER GREATER THAN 100.5 F  *CHILLS WITH OR WITHOUT FEVER  NAUSEA AND VOMITING THAT IS NOT CONTROLLED WITH YOUR NAUSEA MEDICATION  *UNUSUAL SHORTNESS OF BREATH  *UNUSUAL BRUISING OR BLEEDING  TENDERNESS IN MOUTH AND THROAT WITH OR WITHOUT PRESENCE OF ULCERS  *URINARY PROBLEMS  *BOWEL PROBLEMS  UNUSUAL RASH Items with * indicate a potential emergency and should be followed up as soon as possible.  Feel free to call the clinic you have any questions or concerns. The clinic phone number is [phone].    

## 2014-07-27 ENCOUNTER — Other Ambulatory Visit: Payer: Self-pay | Admitting: *Deleted

## 2014-07-27 ENCOUNTER — Encounter: Payer: Self-pay | Admitting: *Deleted

## 2014-07-27 ENCOUNTER — Inpatient Hospital Stay: Payer: Medicare Other

## 2014-07-27 DIAGNOSIS — D126 Benign neoplasm of colon, unspecified: Secondary | ICD-10-CM

## 2014-07-27 MED ORDER — PEGFILGRASTIM INJECTION 6 MG/0.6ML ~~LOC~~
6.0000 mg | PREFILLED_SYRINGE | Freq: Once | SUBCUTANEOUS | Status: DC
Start: 2014-07-28 — End: 2014-07-27
  Filled 2014-07-27: qty 0.6

## 2014-07-27 NOTE — Progress Notes (Signed)
Orders put in per dr Alen Blew for neulasta injection to be given when chemotherapy pump is d/c'd on 07/28/14

## 2014-07-27 NOTE — Patient Instructions (Signed)
Follow-up in 2 weeks

## 2014-07-28 ENCOUNTER — Ambulatory Visit (HOSPITAL_BASED_OUTPATIENT_CLINIC_OR_DEPARTMENT_OTHER): Payer: Medicare Other

## 2014-07-28 VITALS — BP 101/54 | HR 51 | Temp 97.9°F | Resp 20

## 2014-07-28 DIAGNOSIS — C180 Malignant neoplasm of cecum: Secondary | ICD-10-CM

## 2014-07-28 DIAGNOSIS — C642 Malignant neoplasm of left kidney, except renal pelvis: Secondary | ICD-10-CM

## 2014-07-28 DIAGNOSIS — C189 Malignant neoplasm of colon, unspecified: Secondary | ICD-10-CM

## 2014-07-28 DIAGNOSIS — D701 Agranulocytosis secondary to cancer chemotherapy: Secondary | ICD-10-CM

## 2014-07-28 DIAGNOSIS — T451X5A Adverse effect of antineoplastic and immunosuppressive drugs, initial encounter: Principal | ICD-10-CM

## 2014-07-28 DIAGNOSIS — Z5189 Encounter for other specified aftercare: Secondary | ICD-10-CM

## 2014-07-28 MED ORDER — SODIUM CHLORIDE 0.9 % IJ SOLN
10.0000 mL | INTRAMUSCULAR | Status: DC | PRN
  Administered 2014-07-28: 10 mL
  Filled 2014-07-28: qty 10

## 2014-07-28 MED ORDER — PEGFILGRASTIM INJECTION 6 MG/0.6ML ~~LOC~~
6.0000 mg | PREFILLED_SYRINGE | Freq: Once | SUBCUTANEOUS | Status: AC
  Administered 2014-07-28: 6 mg via SUBCUTANEOUS
  Filled 2014-07-28: qty 0.6

## 2014-07-28 MED ORDER — HEPARIN SOD (PORK) LOCK FLUSH 100 UNIT/ML IV SOLN
500.0000 [IU] | Freq: Once | INTRAVENOUS | Status: AC | PRN
  Administered 2014-07-28: 500 [IU]
  Filled 2014-07-28: qty 5

## 2014-07-28 NOTE — Patient Instructions (Signed)
Pegfilgrastim injection What is this medicine? PEGFILGRASTIM (peg fil GRA stim) is a long-acting granulocyte colony-stimulating factor that stimulates the growth of neutrophils, a type of white blood cell important in the body's fight against infection. It is used to reduce the incidence of fever and infection in patients with certain types of cancer who are receiving chemotherapy that affects the bone marrow. This medicine may be used for other purposes; ask your health care provider or pharmacist if you have questions. COMMON BRAND NAME(S): Neulasta What should I tell my health care provider before I take this medicine? They need to know if you have any of these conditions: -latex allergy -ongoing radiation therapy -sickle cell disease -skin reactions to acrylic adhesives (On-Body Injector only) -an unusual or allergic reaction to pegfilgrastim, filgrastim, other medicines, foods, dyes, or preservatives -pregnant or trying to get pregnant -breast-feeding How should I use this medicine? This medicine is for injection under the skin. If you get this medicine at home, you will be taught how to prepare and give the pre-filled syringe or how to use the On-body Injector. Refer to the patient Instructions for Use for detailed instructions. Use exactly as directed. Take your medicine at regular intervals. Do not take your medicine more often than directed. It is important that you put your used needles and syringes in a special sharps container. Do not put them in a trash can. If you do not have a sharps container, call your pharmacist or healthcare provider to get one. Talk to your pediatrician regarding the use of this medicine in children. Special care may be needed. Overdosage: If you think you have taken too much of this medicine contact a poison control center or emergency room at once. NOTE: This medicine is only for you. Do not share this medicine with others. What if I miss a dose? It is  important not to miss your dose. Call your doctor or health care professional if you miss your dose. If you miss a dose due to an On-body Injector failure or leakage, a new dose should be administered as soon as possible using a single prefilled syringe for manual use. What may interact with this medicine? Interactions have not been studied. Give your health care provider a list of all the medicines, herbs, non-prescription drugs, or dietary supplements you use. Also tell them if you smoke, drink alcohol, or use illegal drugs. Some items may interact with your medicine. This list may not describe all possible interactions. Give your health care provider a list of all the medicines, herbs, non-prescription drugs, or dietary supplements you use. Also tell them if you smoke, drink alcohol, or use illegal drugs. Some items may interact with your medicine. What should I watch for while using this medicine? You may need blood work done while you are taking this medicine. If you are going to need a MRI, CT scan, or other procedure, tell your doctor that you are using this medicine (On-Body Injector only). What side effects may I notice from receiving this medicine? Side effects that you should report to your doctor or health care professional as soon as possible: -allergic reactions like skin rash, itching or hives, swelling of the face, lips, or tongue -dizziness -fever -pain, redness, or irritation at site where injected -pinpoint red spots on the skin -shortness of breath or breathing problems -stomach or side pain, or pain at the shoulder -swelling -tiredness -trouble passing urine Side effects that usually do not require medical attention (report to your doctor   or health care professional if they continue or are bothersome): -bone pain -muscle pain This list may not describe all possible side effects. Call your doctor for medical advice about side effects. You may report side effects to FDA at  1-800-FDA-1088. Where should I keep my medicine? Keep out of the reach of children. Store pre-filled syringes in a refrigerator between 2 and 8 degrees C (36 and 46 degrees F). Do not freeze. Keep in carton to protect from light. Throw away this medicine if it is left out of the refrigerator for more than 48 hours. Throw away any unused medicine after the expiration date. NOTE: This sheet is a summary. It may not cover all possible information. If you have questions about this medicine, talk to your doctor, pharmacist, or health care provider.  2015, [employer]vier/Gold Standard. (2013-10-14 16:14:05)  

## 2014-08-05 ENCOUNTER — Encounter (HOSPITAL_COMMUNITY): Payer: Self-pay

## 2014-08-05 ENCOUNTER — Ambulatory Visit (HOSPITAL_COMMUNITY)
Admission: RE | Admit: 2014-08-05 | Discharge: 2014-08-05 | Disposition: A | Discharge status: Home / Self Care | Payer: Medicare Other | Source: Ambulatory Visit | Attending: Physician Assistant | Admitting: Physician Assistant

## 2014-08-05 ENCOUNTER — Other Ambulatory Visit: Payer: Self-pay | Admitting: Physician Assistant

## 2014-08-05 DIAGNOSIS — C642 Malignant neoplasm of left kidney, except renal pelvis: Secondary | ICD-10-CM | POA: Insufficient documentation

## 2014-08-05 DIAGNOSIS — Z905 Acquired absence of kidney: Secondary | ICD-10-CM | POA: Insufficient documentation

## 2014-08-05 DIAGNOSIS — N2889 Other specified disorders of kidney and ureter: Secondary | ICD-10-CM

## 2014-08-05 DIAGNOSIS — C189 Malignant neoplasm of colon, unspecified: Secondary | ICD-10-CM | POA: Diagnosis present

## 2014-08-05 DIAGNOSIS — Z9049 Acquired absence of other specified parts of digestive tract: Secondary | ICD-10-CM | POA: Diagnosis not present

## 2014-08-05 MED ORDER — IOHEXOL 300 MG/ML  SOLN
100.0000 mL | Freq: Once | INTRAMUSCULAR | Status: AC | PRN
Start: 2014-08-05 — End: 2014-08-05
  Administered 2014-08-05: 100 mL via INTRAVENOUS

## 2014-08-09 ENCOUNTER — Other Ambulatory Visit (HOSPITAL_BASED_OUTPATIENT_CLINIC_OR_DEPARTMENT_OTHER): Payer: Medicare Other

## 2014-08-09 ENCOUNTER — Telehealth: Payer: Self-pay | Admitting: Oncology

## 2014-08-09 ENCOUNTER — Inpatient Hospital Stay: Payer: Medicare Other

## 2014-08-09 ENCOUNTER — Ambulatory Visit (HOSPITAL_BASED_OUTPATIENT_CLINIC_OR_DEPARTMENT_OTHER): Payer: Medicare Other | Admitting: Oncology

## 2014-08-09 VITALS — BP 130/78 | HR 75 | Temp 97.7°F | Resp 18 | Ht 72.0 in | Wt 177.8 lb

## 2014-08-09 DIAGNOSIS — C189 Malignant neoplasm of colon, unspecified: Secondary | ICD-10-CM

## 2014-08-09 DIAGNOSIS — C649 Malignant neoplasm of unspecified kidney, except renal pelvis: Secondary | ICD-10-CM

## 2014-08-09 DIAGNOSIS — C180 Malignant neoplasm of cecum: Secondary | ICD-10-CM

## 2014-08-09 DIAGNOSIS — N2889 Other specified disorders of kidney and ureter: Secondary | ICD-10-CM

## 2014-08-09 LAB — COMPREHENSIVE METABOLIC PANEL (CC13)
ALBUMIN: 3.9 g/dL (ref 3.5–5.0)
ALT: 58 U/L — AB (ref 0–55)
AST: 46 U/L — ABNORMAL HIGH (ref 5–34)
Alkaline Phosphatase: 104 U/L (ref 40–150)
Anion Gap: 9 mEq/L (ref 3–11)
BUN: 15.6 mg/dL (ref 7.0–26.0)
CO2: 29 meq/L (ref 22–29)
Calcium: 9.1 mg/dL (ref 8.4–10.4)
Chloride: 102 mEq/L (ref 98–109)
Creatinine: 1.1 mg/dL (ref 0.7–1.3)
EGFR: 78 mL/min/{1.73_m2} — AB (ref >90)
GLUCOSE: 111 mg/dL (ref 70–140)
Potassium: 3.6 mEq/L (ref 3.5–5.1)
SODIUM: 140 meq/L (ref 136–145)
TOTAL PROTEIN: 7 g/dL (ref 6.4–8.3)
Total Bilirubin: 0.67 mg/dL (ref 0.20–1.20)

## 2014-08-09 LAB — CBC WITH DIFFERENTIAL/PLATELET
BASO%: 0.7 % (ref 0.0–2.0)
Basophils Absolute: 0 10*3/uL (ref 0.0–0.1)
EOS%: 0.2 % (ref 0.0–7.0)
Eosinophils Absolute: 0 10*3/uL (ref 0.0–0.5)
HEMATOCRIT: 39.5 % (ref 38.4–49.9)
HEMOGLOBIN: 12.9 g/dL — AB (ref 13.0–17.1)
LYMPH#: 1.6 10*3/uL (ref 0.9–3.3)
LYMPH%: 23.4 % (ref 14.0–49.0)
MCH: 30.8 pg (ref 27.2–33.4)
MCHC: 32.8 g/dL (ref 32.0–36.0)
MCV: 93.8 fL (ref 79.3–98.0)
MONO#: 0.8 10*3/uL (ref 0.1–0.9)
MONO%: 11.7 % (ref 0.0–14.0)
NEUT%: 64 % (ref 39.0–75.0)
NEUTROS ABS: 4.3 10*3/uL (ref 1.5–6.5)
Platelets: 65 10*3/uL — ABNORMAL LOW (ref 140–400)
RBC: 4.21 10*6/uL (ref 4.20–5.82)
RDW: 15.1 % — ABNORMAL HIGH (ref 11.0–14.6)
WBC: 6.7 10*3/uL (ref 4.0–10.3)

## 2014-08-09 NOTE — Progress Notes (Signed)
Hematology and Oncology Follow Up Visit  Jorge Ross [id number] Oct 10, 1940 74 y.o. 08/09/2014 8:48 AM Jorge Ross, MDClark, Jorge Broach, MD   Principle Diagnosis: 74 year old with colon cancer diagnosed in September 2015. He presented with a cecal mass and found to have a T1 N1 disease. He also has T1 N0 renal cell carcinoma diagnosed at around the same time.   Prior Therapy: He is status post right colectomy done laparoscopically on 04/01/2014. He had a T1 N1 disease was 0 out of 12 lymph nodes involved. He also status post partial nephrectomy on 04/01/2014 for a papillary tumor.  Current therapy: FOLFOX adjuvant chemotherapy cycle 1 to given on 05/31/2014. Status post 4 cycles  Interim History:  Mr. Jorge Ross presents today for a follow-up visit with his wife. Since last visit, he is doing very well. He is tolerating his chemotherapy without complications. He is not reporting any peripheral neuropathy, fatigue, tiredness or change in his performance status.  He's had no blood in his stool. He reports nausea as well as decreased appetite.  He does not report any headaches, blurry vision, syncope. He does not report any chest pain, shortness of breath, cough. He does not report any lower extremity edema or palpitation. He does not report any nausea, vomiting, constipation, diarrhea or hematochezia. He does not report any frequency, urgency or hesitancy. He does not report any skeletal complaints. Remainder of his review of systems unremarkable.  Medications: I have reviewed the patient's current medications.  Current Outpatient Prescriptions  Medication Sig Dispense Refill  . aspirin 81 MG tablet Take 81 mg by mouth daily.      . baclofen (LIORESAL) 10 MG tablet   0  . chlorpheniramine-HYDROcodone (TUSSIONEX) 10-8 MG/5ML LQCR   0  . HYDROcodone-acetaminophen (NORCO/VICODIN) 5-325 MG per tablet Take 1-2 tablets by mouth every 6 (six) hours as needed for moderate pain or severe pain. (Patient not  taking: Reported on 07/26/2014) 40 tablet 0  . irbesartan-hydrochlorothiazide (AVALIDE) 150-12.5 MG per tablet Take 1 tablet by mouth every morning.     . lidocaine-prilocaine (EMLA) cream Apply 1 application topically as needed.  0  . Multiple Vitamin (MULTIVITAMIN) capsule Take 1 capsule by mouth daily.      . ondansetron (ZOFRAN) 8 MG tablet Take 1 tablet (8 mg total) by mouth every 8 (eight) hours as needed. 20 tablet 3  . vitamin E 400 UNIT capsule Take 400 Units by mouth daily.      No current facility-administered medications for this visit.     Allergies:  Allergies  Allergen Reactions  . Codeine     constipation    Past Medical History, Surgical history, Social history, and Family History were reviewed and updated.   Physical Exam: Blood pressure 130/78, pulse 75, temperature 97.7 F (36.5 C), temperature source Oral, resp. rate 18, height 6' (1.829 m), weight 177 lb 12.8 oz (80.65 kg), SpO2 100 %. ECOG: 0 General appearance: alert and cooperative Head: Normocephalic, without obvious abnormality Neck: no adenopathy Lymph nodes: Cervical, supraclavicular, and axillary nodes normal. Heart:regular rate and rhythm, S1, S2 normal, no murmur, click, rub or gallop Lung:chest clear, no wheezing, rales, normal symmetric air entry.  Abdomin: soft, non-tender, without masses or organomegaly EXT:no erythema, induration, or nodules   Lab Results: Lab Results  Component Value Date   WBC 6.7 08/09/2014   HGB 12.9* 08/09/2014   HCT 39.5 08/09/2014   MCV 93.8 08/09/2014   PLT 65* 08/09/2014     Chemistry  Component Value Date/Time   NA 139 07/26/2014 1039   NA 141 05/23/2014 1549   K 3.8 07/26/2014 1039   K 4.1 05/23/2014 1549   CL 104 05/23/2014 1549   CO2 28 07/26/2014 1039   CO2 26 05/23/2014 1549   BUN 18.4 07/26/2014 1039   BUN 17 05/23/2014 1549   CREATININE 0.9 07/26/2014 1039   CREATININE 0.78 05/23/2014 1549      Component Value Date/Time   CALCIUM 9.3  07/26/2014 1039   CALCIUM 9.4 05/23/2014 1549   ALKPHOS 71 07/26/2014 1039   ALKPHOS 55 02/15/2014 1121   AST 19 07/26/2014 1039   AST 24 02/15/2014 1121   ALT 12 07/26/2014 1039   ALT 21 02/15/2014 1121   BILITOT 1.17 07/26/2014 1039   BILITOT 1.0 02/15/2014 1121        EXAM: CT ABDOMEN AND PELVIS WITHOUT AND WITH CONTRAST  TECHNIQUE: Multidetector CT imaging of the abdomen and pelvis was performed following the standard protocol before and following the bolus administration of intravenous contrast.  CONTRAST: 132mL OMNIPAQUE IOHEXOL 300 MG/ML SOLN  COMPARISON: 02/17/2014 chest abdomen pelvic CT. Abdominal MRI of 02/17/2014.  FINDINGS: Lower chest: Clear lung bases. Normal heart size without pericardial or pleural effusion.  Hepatobiliary: Normal liver. Normal gallbladder, without biliary ductal dilatation.  Pancreas: Normal, without mass or ductal dilatation.  Spleen: Normal  Adrenals/Urinary Tract: Normal adrenal glands. No renal calculi or hydronephrosis. Too small to characterize right renal lesions. Interpolar left renal cyst approximately 9 mm.  Hypoattenuation about the lateral aspect interpolar left kidney on image 48 is at the site of wedge resection of the previously described renal cell carcinoma. No evidence of solid enhancing portions to suggest recurrent or residual disease. No hydronephrosis. Normal ureters and urinary bladder. No renal collecting system complication.  Stomach/Bowel: Normal stomach, without wall thickening. Partial right hemicolectomy. Otherwise normal appearance of the colon. Normal small bowel.  Vascular/Lymphatic: 2 left renal arteries. No abdominal aortic aneurysm. No abdominopelvic adenopathy.  Reproductive: Mild prostatomegaly.  Other: No significant free fluid. Tiny fat containing paraumbilical hernia. Left paracentral.  Musculoskeletal: Left ischial bone island. Degenerative disc disease,  including at the lumbosacral junction. Disc bulges at L3-4 and L4-5  IMPRESSION: 1. Status post left partial nephrectomy, without residual or recurrent disease. 2. Status post partial right hemicolectomy, without recurrent or metastatic disease.   Impression and Plan:   74 year old gentleman with the following issues:  1. 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 He is status post laparoscopic right colectomy done on 04/01/2014. He has T1 N1 disease with 1 out of 12 lymph nodes involved. He is now status post 4 cycles of adjuvant FOLFOX. The plan is to proceed with cycle 5 in 2 weeks after 25% dose reduction of oxaliplatin due to thrombocytopenia. He had done very well with chemotherapy and likely will require 8-12 cycles of adjuvant therapy. His CT scan on 08/05/2014 was reviewed and did not show any evidence of recurrent disease.   2. IV access: Port-A-Cath has been inserted without any complications.  3. Antiemetics: Patient has prescription for Zofran available.  4. Renal cell carcinoma status post resection for a T1 N0 disease. The histology showed papillary type.  5. Follow-up: Will be in 2 weeks for cycle 5.  [employer], MD 1/12/20168:48 AM

## 2014-08-09 NOTE — Telephone Encounter (Signed)
, °

## 2014-08-09 NOTE — Addendum Note (Signed)
Addended by: Randolm Idol on: 08/09/2014 09:00 AM   Modules accepted: Medications

## 2014-08-23 ENCOUNTER — Other Ambulatory Visit (HOSPITAL_BASED_OUTPATIENT_CLINIC_OR_DEPARTMENT_OTHER): Payer: Medicare Other

## 2014-08-23 ENCOUNTER — Ambulatory Visit (HOSPITAL_BASED_OUTPATIENT_CLINIC_OR_DEPARTMENT_OTHER): Payer: Medicare Other

## 2014-08-23 DIAGNOSIS — C180 Malignant neoplasm of cecum: Secondary | ICD-10-CM

## 2014-08-23 DIAGNOSIS — C189 Malignant neoplasm of colon, unspecified: Secondary | ICD-10-CM

## 2014-08-23 DIAGNOSIS — Z5111 Encounter for antineoplastic chemotherapy: Secondary | ICD-10-CM

## 2014-08-23 LAB — COMPREHENSIVE METABOLIC PANEL (CC13)
ALBUMIN: 4.1 g/dL (ref 3.5–5.0)
ALK PHOS: 85 U/L (ref 40–150)
ALT: 25 U/L (ref 0–55)
AST: 21 U/L (ref 5–34)
Anion Gap: 11 mEq/L (ref 3–11)
BILIRUBIN TOTAL: 1 mg/dL (ref 0.20–1.20)
BUN: 17.7 mg/dL (ref 7.0–26.0)
CO2: 28 meq/L (ref 22–29)
Calcium: 9.2 mg/dL (ref 8.4–10.4)
Chloride: 101 mEq/L (ref 98–109)
Creatinine: 1 mg/dL (ref 0.7–1.3)
EGFR: 89 mL/min/{1.73_m2} — ABNORMAL LOW (ref >90)
GLUCOSE: 94 mg/dL (ref 70–140)
Potassium: 4 mEq/L (ref 3.5–5.1)
SODIUM: 140 meq/L (ref 136–145)
TOTAL PROTEIN: 7.3 g/dL (ref 6.4–8.3)

## 2014-08-23 LAB — CBC WITH DIFFERENTIAL/PLATELET
BASO%: 0.2 % (ref 0.0–2.0)
BASOS ABS: 0 10*3/uL (ref 0.0–0.1)
EOS ABS: 0.1 10*3/uL (ref 0.0–0.5)
EOS%: 0.9 % (ref 0.0–7.0)
HCT: 38.5 % (ref 38.4–49.9)
HEMOGLOBIN: 12.8 g/dL — AB (ref 13.0–17.1)
LYMPH%: 26 % (ref 14.0–49.0)
MCH: 31.2 pg (ref 27.2–33.4)
MCHC: 33.2 g/dL (ref 32.0–36.0)
MCV: 93.9 fL (ref 79.3–98.0)
MONO#: 0.8 10*3/uL (ref 0.1–0.9)
MONO%: 13.2 % (ref 0.0–14.0)
NEUT#: 3.4 10*3/uL (ref 1.5–6.5)
NEUT%: 59.7 % (ref 39.0–75.0)
PLATELETS: 179 10*3/uL (ref 140–400)
RBC: 4.1 10*6/uL — ABNORMAL LOW (ref 4.20–5.82)
RDW: 15.4 % — AB (ref 11.0–14.6)
WBC: 5.7 10*3/uL (ref 4.0–10.3)
lymph#: 1.5 10*3/uL (ref 0.9–3.3)

## 2014-08-23 MED ORDER — DEXTROSE 5 % IV SOLN
Freq: Once | INTRAVENOUS | Status: AC
  Administered 2014-08-23: 10:00:00 via INTRAVENOUS

## 2014-08-23 MED ORDER — DEXTROSE 5 % IV SOLN
394.0000 mg/m2 | Freq: Once | INTRAVENOUS | Status: AC
  Administered 2014-08-23: 800 mg via INTRAVENOUS
  Filled 2014-08-23: qty 40

## 2014-08-23 MED ORDER — FLUOROURACIL CHEMO INJECTION 2.5 GM/50ML
400.0000 mg/m2 | Freq: Once | INTRAVENOUS | Status: AC
  Administered 2014-08-23: 800 mg via INTRAVENOUS
  Filled 2014-08-23: qty 16

## 2014-08-23 MED ORDER — SODIUM CHLORIDE 0.9 % IV SOLN
2400.0000 mg/m2 | INTRAVENOUS | Status: DC
  Administered 2014-08-23: 4850 mg via INTRAVENOUS
  Filled 2014-08-23: qty 97

## 2014-08-23 MED ORDER — DEXAMETHASONE SODIUM PHOSPHATE 10 MG/ML IJ SOLN
INTRAMUSCULAR | Status: AC
  Filled 2014-08-23: qty 1

## 2014-08-23 MED ORDER — ONDANSETRON 8 MG/NS 50 ML IVPB
INTRAVENOUS | Status: AC
  Filled 2014-08-23: qty 8

## 2014-08-23 MED ORDER — DEXAMETHASONE SODIUM PHOSPHATE 10 MG/ML IJ SOLN
10.0000 mg | Freq: Once | INTRAMUSCULAR | Status: AC
  Administered 2014-08-23: 10 mg via INTRAVENOUS

## 2014-08-23 MED ORDER — OXALIPLATIN CHEMO INJECTION 100 MG/20ML
63.7500 mg/m2 | Freq: Once | INTRAVENOUS | Status: AC
  Administered 2014-08-23: 130 mg via INTRAVENOUS
  Filled 2014-08-23: qty 26

## 2014-08-23 MED ORDER — ONDANSETRON 8 MG/50ML IVPB (CHCC)
8.0000 mg | Freq: Once | INTRAVENOUS | Status: AC
  Administered 2014-08-23: 8 mg via INTRAVENOUS

## 2014-08-23 NOTE — Patient Instructions (Signed)
[facility] Discharge Instructions for Patients Receiving Chemotherapy  Today you received the following chemotherapy agents: Oxaliplatin, Leucovorin, 45fu push/pump  To help prevent nausea and vomiting after your treatment, we encourage you to take your nausea medication as prescribed by your physician.  If you develop nausea and vomiting that is not controlled by your nausea medication, call the clinic.   BELOW ARE SYMPTOMS THAT SHOULD BE REPORTED IMMEDIATELY:  *FEVER GREATER THAN 100.5 F  *CHILLS WITH OR WITHOUT FEVER  NAUSEA AND VOMITING THAT IS NOT CONTROLLED WITH YOUR NAUSEA MEDICATION  *UNUSUAL SHORTNESS OF BREATH  *UNUSUAL BRUISING OR BLEEDING  TENDERNESS IN MOUTH AND THROAT WITH OR WITHOUT PRESENCE OF ULCERS  *URINARY PROBLEMS  *BOWEL PROBLEMS  UNUSUAL RASH Items with * indicate a potential emergency and should be followed up as soon as possible.  Feel free to call the clinic you have any questions or concerns. The clinic phone number is [phone].

## 2014-08-25 ENCOUNTER — Other Ambulatory Visit: Payer: Self-pay | Admitting: Oncology

## 2014-08-25 ENCOUNTER — Ambulatory Visit (HOSPITAL_BASED_OUTPATIENT_CLINIC_OR_DEPARTMENT_OTHER): Payer: Medicare Other

## 2014-08-25 ENCOUNTER — Other Ambulatory Visit: Payer: Self-pay | Admitting: *Deleted

## 2014-08-25 ENCOUNTER — Ambulatory Visit: Payer: Medicare Other

## 2014-08-25 DIAGNOSIS — C189 Malignant neoplasm of colon, unspecified: Secondary | ICD-10-CM

## 2014-08-25 DIAGNOSIS — N2889 Other specified disorders of kidney and ureter: Secondary | ICD-10-CM

## 2014-08-25 DIAGNOSIS — Z5189 Encounter for other specified aftercare: Secondary | ICD-10-CM

## 2014-08-25 DIAGNOSIS — D126 Benign neoplasm of colon, unspecified: Secondary | ICD-10-CM

## 2014-08-25 DIAGNOSIS — C642 Malignant neoplasm of left kidney, except renal pelvis: Secondary | ICD-10-CM

## 2014-08-25 DIAGNOSIS — C180 Malignant neoplasm of cecum: Secondary | ICD-10-CM

## 2014-08-25 MED ORDER — PEGFILGRASTIM INJECTION 6 MG/0.6ML ~~LOC~~
6.0000 mg | PREFILLED_SYRINGE | Freq: Once | SUBCUTANEOUS | Status: AC
  Administered 2014-08-25: 6 mg via SUBCUTANEOUS
  Filled 2014-08-25: qty 0.6

## 2014-08-25 MED ORDER — HEPARIN SOD (PORK) LOCK FLUSH 100 UNIT/ML IV SOLN
500.0000 [IU] | Freq: Once | INTRAVENOUS | Status: AC | PRN
  Administered 2014-08-25: 500 [IU]
  Filled 2014-08-25: qty 5

## 2014-08-25 MED ORDER — ONDANSETRON HCL 8 MG PO TABS: 8.0000 mg | ORAL_TABLET | Freq: Three times a day (TID) | ORAL | Status: DC | PRN

## 2014-08-25 MED ORDER — SODIUM CHLORIDE 0.9 % IJ SOLN
10.0000 mL | INTRAMUSCULAR | Status: DC | PRN
  Administered 2014-08-25: 10 mL
  Filled 2014-08-25: qty 10

## 2014-08-25 NOTE — Progress Notes (Signed)
Injection given by flush nurse 

## 2014-08-25 NOTE — Patient Instructions (Signed)
Pegfilgrastim injection What is this medicine? PEGFILGRASTIM (peg fil GRA stim) is a long-acting granulocyte colony-stimulating factor that stimulates the growth of neutrophils, a type of white blood cell important in the body's fight against infection. It is used to reduce the incidence of fever and infection in patients with certain types of cancer who are receiving chemotherapy that affects the bone marrow. This medicine may be used for other purposes; ask your health care provider or pharmacist if you have questions. COMMON BRAND NAME(S): Neulasta What should I tell my health care provider before I take this medicine? They need to know if you have any of these conditions: -latex allergy -ongoing radiation therapy -sickle cell disease -skin reactions to acrylic adhesives (On-Body Injector only) -an unusual or allergic reaction to pegfilgrastim, filgrastim, other medicines, foods, dyes, or preservatives -pregnant or trying to get pregnant -breast-feeding How should I use this medicine? This medicine is for injection under the skin. If you get this medicine at home, you will be taught how to prepare and give the pre-filled syringe or how to use the On-body Injector. Refer to the patient Instructions for Use for detailed instructions. Use exactly as directed. Take your medicine at regular intervals. Do not take your medicine more often than directed. It is important that you put your used needles and syringes in a special sharps container. Do not put them in a trash can. If you do not have a sharps container, call your pharmacist or healthcare provider to get one. Talk to your pediatrician regarding the use of this medicine in children. Special care may be needed. Overdosage: If you think you have taken too much of this medicine contact a poison control center or emergency room at once. NOTE: This medicine is only for you. Do not share this medicine with others. What if I miss a dose? It is  important not to miss your dose. Call your doctor or health care professional if you miss your dose. If you miss a dose due to an On-body Injector failure or leakage, a new dose should be administered as soon as possible using a single prefilled syringe for manual use. What may interact with this medicine? Interactions have not been studied. Give your health care provider a list of all the medicines, herbs, non-prescription drugs, or dietary supplements you use. Also tell them if you smoke, drink alcohol, or use illegal drugs. Some items may interact with your medicine. This list may not describe all possible interactions. Give your health care provider a list of all the medicines, herbs, non-prescription drugs, or dietary supplements you use. Also tell them if you smoke, drink alcohol, or use illegal drugs. Some items may interact with your medicine. What should I watch for while using this medicine? You may need blood work done while you are taking this medicine. If you are going to need a MRI, CT scan, or other procedure, tell your doctor that you are using this medicine (On-Body Injector only). What side effects may I notice from receiving this medicine? Side effects that you should report to your doctor or health care professional as soon as possible: -allergic reactions like skin rash, itching or hives, swelling of the face, lips, or tongue -dizziness -fever -pain, redness, or irritation at site where injected -pinpoint red spots on the skin -shortness of breath or breathing problems -stomach or side pain, or pain at the shoulder -swelling -tiredness -trouble passing urine Side effects that usually do not require medical attention (report to your doctor   or health care professional if they continue or are bothersome): -bone pain -muscle pain This list may not describe all possible side effects. Call your doctor for medical advice about side effects. You may report side effects to FDA at  1-800-FDA-1088. Where should I keep my medicine? Keep out of the reach of children. Store pre-filled syringes in a refrigerator between 2 and 8 degrees C (36 and 46 degrees F). Do not freeze. Keep in carton to protect from light. Throw away this medicine if it is left out of the refrigerator for more than 48 hours. Throw away any unused medicine after the expiration date. NOTE: This sheet is a summary. It may not cover all possible information. If you have questions about this medicine, talk to your doctor, pharmacist, or health care provider.  2015, [employer]vier/Gold Standard. (2013-10-14 16:14:05)  

## 2014-09-06 ENCOUNTER — Telehealth: Payer: Self-pay | Admitting: Oncology

## 2014-09-06 ENCOUNTER — Ambulatory Visit (HOSPITAL_BASED_OUTPATIENT_CLINIC_OR_DEPARTMENT_OTHER): Payer: Medicare Other | Admitting: Physician Assistant

## 2014-09-06 ENCOUNTER — Other Ambulatory Visit: Payer: Self-pay | Admitting: Physician Assistant

## 2014-09-06 ENCOUNTER — Encounter: Payer: Self-pay | Admitting: Physician Assistant

## 2014-09-06 ENCOUNTER — Other Ambulatory Visit (HOSPITAL_BASED_OUTPATIENT_CLINIC_OR_DEPARTMENT_OTHER): Payer: Medicare Other

## 2014-09-06 ENCOUNTER — Ambulatory Visit (HOSPITAL_BASED_OUTPATIENT_CLINIC_OR_DEPARTMENT_OTHER): Payer: Medicare Other

## 2014-09-06 VITALS — BP 116/58 | HR 62 | Temp 97.9°F | Resp 18 | Ht 72.0 in | Wt 180.3 lb

## 2014-09-06 DIAGNOSIS — C189 Malignant neoplasm of colon, unspecified: Secondary | ICD-10-CM

## 2014-09-06 DIAGNOSIS — C180 Malignant neoplasm of cecum: Secondary | ICD-10-CM

## 2014-09-06 DIAGNOSIS — C642 Malignant neoplasm of left kidney, except renal pelvis: Secondary | ICD-10-CM

## 2014-09-06 DIAGNOSIS — R21 Rash and other nonspecific skin eruption: Secondary | ICD-10-CM

## 2014-09-06 DIAGNOSIS — Z5111 Encounter for antineoplastic chemotherapy: Secondary | ICD-10-CM

## 2014-09-06 DIAGNOSIS — R682 Dry mouth, unspecified: Secondary | ICD-10-CM

## 2014-09-06 LAB — COMPREHENSIVE METABOLIC PANEL (CC13)
ALK PHOS: 115 U/L (ref 40–150)
ALT: 41 U/L (ref 0–55)
ANION GAP: 11 meq/L (ref 3–11)
AST: 35 U/L — ABNORMAL HIGH (ref 5–34)
Albumin: 4 g/dL (ref 3.5–5.0)
BILIRUBIN TOTAL: 0.86 mg/dL (ref 0.20–1.20)
BUN: 16.1 mg/dL (ref 7.0–26.0)
CO2: 25 mEq/L (ref 22–29)
Calcium: 9.2 mg/dL (ref 8.4–10.4)
Chloride: 105 mEq/L (ref 98–109)
Creatinine: 0.9 mg/dL (ref 0.7–1.3)
GLUCOSE: 97 mg/dL (ref 70–140)
Potassium: 3.6 mEq/L (ref 3.5–5.1)
SODIUM: 141 meq/L (ref 136–145)
TOTAL PROTEIN: 6.9 g/dL (ref 6.4–8.3)

## 2014-09-06 LAB — CBC WITH DIFFERENTIAL/PLATELET
BASO%: 0.3 % (ref 0.0–2.0)
Basophils Absolute: 0 10*3/uL (ref 0.0–0.1)
EOS ABS: 0.1 10*3/uL (ref 0.0–0.5)
EOS%: 0.7 % (ref 0.0–7.0)
HCT: 36.8 % — ABNORMAL LOW (ref 38.4–49.9)
HGB: 12.4 g/dL — ABNORMAL LOW (ref 13.0–17.1)
LYMPH%: 17.6 % (ref 14.0–49.0)
MCH: 32.3 pg (ref 27.2–33.4)
MCHC: 33.7 g/dL (ref 32.0–36.0)
MCV: 95.8 fL (ref 79.3–98.0)
MONO#: 0.7 10*3/uL (ref 0.1–0.9)
MONO%: 9.3 % (ref 0.0–14.0)
NEUT%: 72.1 % (ref 39.0–75.0)
NEUTROS ABS: 5.3 10*3/uL (ref 1.5–6.5)
PLATELETS: 101 10*3/uL — AB (ref 140–400)
RBC: 3.84 10*6/uL — ABNORMAL LOW (ref 4.20–5.82)
RDW: 16.2 % — ABNORMAL HIGH (ref 11.0–14.6)
WBC: 7.3 10*3/uL (ref 4.0–10.3)
lymph#: 1.3 10*3/uL (ref 0.9–3.3)

## 2014-09-06 MED ORDER — DEXAMETHASONE SODIUM PHOSPHATE 10 MG/ML IJ SOLN
10.0000 mg | Freq: Once | INTRAMUSCULAR | Status: AC
  Administered 2014-09-06: 10 mg via INTRAVENOUS

## 2014-09-06 MED ORDER — DEXTROSE 5 % IV SOLN
63.7500 mg/m2 | Freq: Once | INTRAVENOUS | Status: AC
  Administered 2014-09-06: 130 mg via INTRAVENOUS
  Filled 2014-09-06: qty 26

## 2014-09-06 MED ORDER — FLUOROURACIL CHEMO INJECTION 5 GM/100ML
2400.0000 mg/m2 | INTRAVENOUS | Status: DC
  Administered 2014-09-06: 4850 mg via INTRAVENOUS
  Filled 2014-09-06: qty 97

## 2014-09-06 MED ORDER — ONDANSETRON 8 MG/NS 50 ML IVPB
INTRAVENOUS | Status: AC
Start: 2014-09-06 — End: 2014-09-06
  Filled 2014-09-06: qty 8

## 2014-09-06 MED ORDER — DEXTROSE 5 % IV SOLN
Freq: Once | INTRAVENOUS | Status: AC
  Administered 2014-09-06: 11:00:00 via INTRAVENOUS

## 2014-09-06 MED ORDER — FLUOROURACIL CHEMO INJECTION 2.5 GM/50ML
400.0000 mg/m2 | Freq: Once | INTRAVENOUS | Status: AC
  Administered 2014-09-06: 800 mg via INTRAVENOUS
  Filled 2014-09-06: qty 16

## 2014-09-06 MED ORDER — ONDANSETRON 8 MG/50ML IVPB (CHCC)
8.0000 mg | Freq: Once | INTRAVENOUS | Status: AC
  Administered 2014-09-06: 8 mg via INTRAVENOUS

## 2014-09-06 MED ORDER — DEXAMETHASONE SODIUM PHOSPHATE 10 MG/ML IJ SOLN
INTRAMUSCULAR | Status: AC
  Filled 2014-09-06: qty 1

## 2014-09-06 MED ORDER — LEUCOVORIN CALCIUM INJECTION 350 MG
394.0000 mg/m2 | Freq: Once | INTRAVENOUS | Status: AC
  Administered 2014-09-06: 800 mg via INTRAVENOUS
  Filled 2014-09-06: qty 40

## 2014-09-06 NOTE — Progress Notes (Addendum)
Hematology and Oncology Follow Up Visit  Jorge Ross [id number] [birth year] 74 y.o. 09/06/2014 1:45 PM [name], MDClark, Don Broach, MD   Principle Diagnosis: 74 year old with colon cancer diagnosed in September 2015. He presented with a cecal mass and found to have a T1 N1 disease. He also has T1 N0 renal cell carcinoma diagnosed at around the same time.   Prior Therapy: He is status post right colectomy done laparoscopically on 04/01/2014. He had a T1 N1 disease was 0 out of 12 lymph nodes involved. He also status post partial nephrectomy on 04/01/2014 for a papillary tumor.  Current therapy: FOLFOX adjuvant chemotherapy cycle 1 to given on 05/31/2014. Status post 5 cycles  Interim History:  Mr. Jorge Ross presents today for a follow-up visit with his wife. Since last visit, he is doing very well. Overall he is tolerating his chemotherapy without complications. He has noticed a skin rash develop on his upper back and neck, chest and upper abdomen. The rash is not particularly itchy in nature. He also reports a dry mouth. He admits to perhaps not drinking as much fluid as he should on a daily basis. He is not reporting any peripheral neuropathy, fatigue, tiredness or change in his performance status.  He's had no blood in his stool. He reports nausea as well as decreased appetite.  He does not report any headaches, blurry vision, syncope. He does not report any chest pain, shortness of breath, cough. He does not report any lower extremity edema or palpitation. He does not report any nausea, vomiting, constipation, diarrhea or hematochezia. He does not report any frequency, urgency or hesitancy. He does not report any skeletal complaints. Remainder of his review of systems unremarkable.  Medications: I have reviewed the patient's current medications.  Current Outpatient Prescriptions  Medication Sig Dispense Refill  . acetaminophen (TYLENOL) 500 MG tablet Take 500 mg by mouth every 6 (six) hours  as needed.    Marland Kitchen aspirin 81 MG tablet Take 81 mg by mouth daily.      . irbesartan-hydrochlorothiazide (AVALIDE) 150-12.5 MG per tablet Take 1 tablet by mouth every morning.     . lidocaine-prilocaine (EMLA) cream Apply 1 application topically as needed.  0  . Multiple Vitamin (MULTIVITAMIN) capsule Take 1 capsule by mouth daily.      . ondansetron (ZOFRAN) 8 MG tablet Take 1 tablet (8 mg total) by mouth every 8 (eight) hours as needed. 30 tablet 3  . vitamin E 400 UNIT capsule Take 400 Units by mouth daily.     . baclofen (LIORESAL) 10 MG tablet   0  . HYDROcodone-acetaminophen (NORCO/VICODIN) 5-325 MG per tablet Take 1-2 tablets by mouth every 6 (six) hours as needed for moderate pain or severe pain. (Patient not taking: Reported on 09/06/2014) 40 tablet 0   No current facility-administered medications for this visit.   Facility-Administered Medications Ordered in Other Visits  Medication Dose Route Frequency Provider Last Rate Last Dose  . fluorouracil (ADRUCIL) 4,850 mg in sodium chloride 0.9 % 150 mL chemo infusion  2,400 mg/m2 (Treatment Plan Actual) Intravenous 1 day or 1 dose Wyatt Portela, MD   4,850 mg at 09/06/14 1341     Allergies:  Allergies  Allergen Reactions  . Codeine     constipation    Past Medical History, Surgical history, Social history, and Family History were reviewed and updated.   Physical Exam: Blood pressure 116/58, pulse 62, temperature 97.9 F (36.6 C), temperature source Oral, resp. rate 18, height  6' (1.829 m), weight 180 lb 4.8 oz (81.784 kg). ECOG: 0 General appearance: alert and cooperative Head: Normocephalic, without obvious abnormality Neck: no adenopathy Lymph nodes: Cervical, supraclavicular, and axillary nodes normal. Heart:regular rate and rhythm, S1, S2 normal, no murmur, click, rub or gallop Lung:chest clear, no wheezing, rales, normal symmetric air entry.  Abdomin: soft, non-tender, without masses or organomegaly EXT:no erythema,  induration, or nodules Skin: Reveals hyperpigmented small macular lesions located on the anterior chest and upper abdomen as well as the upper back and back of the neck and shoulder regions. The lesions are dry and flaky. There is no evidence of any superinfection.  Lab Results: Lab Results  Component Value Date   WBC 7.3 09/06/2014   HGB 12.4* 09/06/2014   HCT 36.8* 09/06/2014   MCV 95.8 09/06/2014   PLT 101* 09/06/2014     Chemistry      Component Value Date/Time   NA 141 09/06/2014 0920   NA 141 05/23/2014 1549   K 3.6 09/06/2014 0920   K 4.1 05/23/2014 1549   CL 104 05/23/2014 1549   CO2 25 09/06/2014 0920   CO2 26 05/23/2014 1549   BUN 16.1 09/06/2014 0920   BUN 17 05/23/2014 1549   CREATININE 0.9 09/06/2014 0920   CREATININE 0.78 05/23/2014 1549      Component Value Date/Time   CALCIUM 9.2 09/06/2014 0920   CALCIUM 9.4 05/23/2014 1549   ALKPHOS 115 09/06/2014 0920   ALKPHOS 55 02/15/2014 1121   AST 35* 09/06/2014 0920   AST 24 02/15/2014 1121   ALT 41 09/06/2014 0920   ALT 21 02/15/2014 1121   BILITOT 0.86 09/06/2014 0920   BILITOT 1.0 02/15/2014 1121        EXAM: CT ABDOMEN AND PELVIS WITHOUT AND WITH CONTRAST  TECHNIQUE: Multidetector CT imaging of the abdomen and pelvis was performed following the standard protocol before and following the bolus administration of intravenous contrast.  CONTRAST: 1104mL OMNIPAQUE IOHEXOL 300 MG/ML SOLN  COMPARISON: 02/17/2014 chest abdomen pelvic CT. Abdominal MRI of 02/17/2014.  FINDINGS: Lower chest: Clear lung bases. Normal heart size without pericardial or pleural effusion.  Hepatobiliary: Normal liver. Normal gallbladder, without biliary ductal dilatation.  Pancreas: Normal, without mass or ductal dilatation.  Spleen: Normal  Adrenals/Urinary Tract: Normal adrenal glands. No renal calculi or hydronephrosis. Too small to characterize right renal lesions. Interpolar left renal cyst  approximately 9 mm.  Hypoattenuation about the lateral aspect interpolar left kidney on image 48 is at the site of wedge resection of the previously described renal cell carcinoma. No evidence of solid enhancing portions to suggest recurrent or residual disease. No hydronephrosis. Normal ureters and urinary bladder. No renal collecting system complication.  Stomach/Bowel: Normal stomach, without wall thickening. Partial right hemicolectomy. Otherwise normal appearance of the colon. Normal small bowel.  Vascular/Lymphatic: 2 left renal arteries. No abdominal aortic aneurysm. No abdominopelvic adenopathy.  Reproductive: Mild prostatomegaly.  Other: No significant free fluid. Tiny fat containing paraumbilical hernia. Left paracentral.  Musculoskeletal: Left ischial bone island. Degenerative disc disease, including at the lumbosacral junction. Disc bulges at L3-4 and L4-5  IMPRESSION: 1. Status post left partial nephrectomy, without residual or recurrent disease. 2. Status post partial right hemicolectomy, without recurrent or metastatic disease.   Impression and Plan:   74 year old gentleman with the following issues:  1. 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 He is status post laparoscopic right colectomy done on 04/01/2014. He has T1 N1 disease with 1 out of  12 lymph nodes involved. He is now status post 4 cycles of adjuvant FOLFOX. The plan is to proceed with cycle 6 today with 25% dose reduction of oxaliplatin due to thrombocytopenia. He had done very well with chemotherapy and likely will require 8-12 cycles of adjuvant therapy. His CT scan on 08/05/2014 did not show any evidence of recurrent disease.  2. IV access: Port-A-Cath has been inserted without any complications.  3. Antiemetics: Patient has prescription for Zofran available.  4. Renal cell carcinoma status post resection for a T1 N0 disease. The histology showed papillary  type.  5. Skin rash: Likely related to his current chemotherapy. He is not particularly symptomatic from this We will continue to monitor this at future visits.  6. Follow-up: Will be in 2 weeks for cycle 7.  Carlton Adam, PA-C  2/9/20161:45 PM

## 2014-09-06 NOTE — Patient Instructions (Signed)
[facility] Discharge Instructions for Patients Receiving Chemotherapy  Today you received the following chemotherapy agents FOLFOX.   To help prevent nausea and vomiting after your treatment, we encourage you to take your nausea medication as directed.    If you develop nausea and vomiting that is not controlled by your nausea medication, call the clinic.   BELOW ARE SYMPTOMS THAT SHOULD BE REPORTED IMMEDIATELY:  *FEVER GREATER THAN 100.5 F  *CHILLS WITH OR WITHOUT FEVER  NAUSEA AND VOMITING THAT IS NOT CONTROLLED WITH YOUR NAUSEA MEDICATION  *UNUSUAL SHORTNESS OF BREATH  *UNUSUAL BRUISING OR BLEEDING  TENDERNESS IN MOUTH AND THROAT WITH OR WITHOUT PRESENCE OF ULCERS  *URINARY PROBLEMS  *BOWEL PROBLEMS  UNUSUAL RASH Items with * indicate a potential emergency and should be followed up as soon as possible.  Feel free to call the clinic you have any questions or concerns. The clinic phone number is [phone].    

## 2014-09-06 NOTE — Telephone Encounter (Signed)
printed pt avs °

## 2014-09-06 NOTE — Patient Instructions (Signed)
Increase your by mouth intake of fluid daily Follow-up in 2 weeks

## 2014-09-08 ENCOUNTER — Ambulatory Visit: Payer: Medicare Other

## 2014-09-08 ENCOUNTER — Ambulatory Visit (HOSPITAL_BASED_OUTPATIENT_CLINIC_OR_DEPARTMENT_OTHER): Payer: Medicare Other

## 2014-09-08 DIAGNOSIS — C180 Malignant neoplasm of cecum: Secondary | ICD-10-CM

## 2014-09-08 DIAGNOSIS — Z452 Encounter for adjustment and management of vascular access device: Secondary | ICD-10-CM

## 2014-09-08 DIAGNOSIS — Z5189 Encounter for other specified aftercare: Secondary | ICD-10-CM

## 2014-09-08 DIAGNOSIS — C189 Malignant neoplasm of colon, unspecified: Secondary | ICD-10-CM

## 2014-09-08 MED ORDER — SODIUM CHLORIDE 0.9 % IJ SOLN
10.0000 mL | INTRAMUSCULAR | Status: DC | PRN
  Administered 2014-09-08: 10 mL
  Filled 2014-09-08: qty 10

## 2014-09-08 MED ORDER — PEGFILGRASTIM INJECTION 6 MG/0.6ML ~~LOC~~
6.0000 mg | PREFILLED_SYRINGE | Freq: Once | SUBCUTANEOUS | Status: AC
  Administered 2014-09-08: 6 mg via SUBCUTANEOUS
  Filled 2014-09-08: qty 0.6

## 2014-09-08 MED ORDER — HEPARIN SOD (PORK) LOCK FLUSH 100 UNIT/ML IV SOLN
500.0000 [IU] | Freq: Once | INTRAVENOUS | Status: AC | PRN
  Administered 2014-09-08: 500 [IU]
  Filled 2014-09-08: qty 5

## 2014-09-08 NOTE — Progress Notes (Signed)
neulasta injection given by flush nurse when pump  Discontinued.

## 2014-09-09 ENCOUNTER — Encounter: Payer: Self-pay | Admitting: *Deleted

## 2014-09-20 ENCOUNTER — Telehealth: Payer: Self-pay | Admitting: Oncology

## 2014-09-20 ENCOUNTER — Ambulatory Visit (HOSPITAL_BASED_OUTPATIENT_CLINIC_OR_DEPARTMENT_OTHER): Payer: Medicare Other

## 2014-09-20 ENCOUNTER — Other Ambulatory Visit (HOSPITAL_BASED_OUTPATIENT_CLINIC_OR_DEPARTMENT_OTHER): Payer: Medicare Other

## 2014-09-20 ENCOUNTER — Ambulatory Visit (HOSPITAL_BASED_OUTPATIENT_CLINIC_OR_DEPARTMENT_OTHER): Payer: Medicare Other | Admitting: Oncology

## 2014-09-20 ENCOUNTER — Other Ambulatory Visit: Payer: Self-pay | Admitting: Oncology

## 2014-09-20 VITALS — BP 122/62 | HR 67 | Temp 98.0°F | Resp 20 | Ht 72.0 in | Wt 177.2 lb

## 2014-09-20 DIAGNOSIS — C642 Malignant neoplasm of left kidney, except renal pelvis: Secondary | ICD-10-CM

## 2014-09-20 DIAGNOSIS — C180 Malignant neoplasm of cecum: Secondary | ICD-10-CM

## 2014-09-20 DIAGNOSIS — C779 Secondary and unspecified malignant neoplasm of lymph node, unspecified: Secondary | ICD-10-CM

## 2014-09-20 DIAGNOSIS — R21 Rash and other nonspecific skin eruption: Secondary | ICD-10-CM

## 2014-09-20 DIAGNOSIS — Z5111 Encounter for antineoplastic chemotherapy: Secondary | ICD-10-CM

## 2014-09-20 DIAGNOSIS — C189 Malignant neoplasm of colon, unspecified: Secondary | ICD-10-CM

## 2014-09-20 LAB — COMPREHENSIVE METABOLIC PANEL (CC13)
ALT: 48 U/L (ref 0–55)
ANION GAP: 13 meq/L — AB (ref 3–11)
AST: 38 U/L — ABNORMAL HIGH (ref 5–34)
Albumin: 4 g/dL (ref 3.5–5.0)
Alkaline Phosphatase: 130 U/L (ref 40–150)
BILIRUBIN TOTAL: 0.78 mg/dL (ref 0.20–1.20)
BUN: 19.2 mg/dL (ref 7.0–26.0)
CALCIUM: 9.4 mg/dL (ref 8.4–10.4)
CHLORIDE: 103 meq/L (ref 98–109)
CO2: 24 meq/L (ref 22–29)
CREATININE: 1 mg/dL (ref 0.7–1.3)
EGFR: 89 mL/min/{1.73_m2} — ABNORMAL LOW (ref >90)
Glucose: 95 mg/dl (ref 70–140)
Potassium: 3.6 mEq/L (ref 3.5–5.1)
SODIUM: 139 meq/L (ref 136–145)
TOTAL PROTEIN: 6.9 g/dL (ref 6.4–8.3)

## 2014-09-20 LAB — CBC WITH DIFFERENTIAL/PLATELET
BASO%: 0.4 % (ref 0.0–2.0)
BASOS ABS: 0 10*3/uL (ref 0.0–0.1)
EOS%: 0.4 % (ref 0.0–7.0)
Eosinophils Absolute: 0 10*3/uL (ref 0.0–0.5)
HCT: 35.1 % — ABNORMAL LOW (ref 38.4–49.9)
HGB: 11.9 g/dL — ABNORMAL LOW (ref 13.0–17.1)
LYMPH#: 1.7 10*3/uL (ref 0.9–3.3)
LYMPH%: 20.3 % (ref 14.0–49.0)
MCH: 33.1 pg (ref 27.2–33.4)
MCHC: 33.9 g/dL (ref 32.0–36.0)
MCV: 97.5 fL (ref 79.3–98.0)
MONO#: 1.1 10*3/uL — ABNORMAL HIGH (ref 0.1–0.9)
MONO%: 12.7 % (ref 0.0–14.0)
NEUT%: 66.2 % (ref 39.0–75.0)
NEUTROS ABS: 5.7 10*3/uL (ref 1.5–6.5)
Platelets: 115 10*3/uL — ABNORMAL LOW (ref 140–400)
RBC: 3.6 10*6/uL — AB (ref 4.20–5.82)
RDW: 16.7 % — ABNORMAL HIGH (ref 11.0–14.6)
WBC: 8.6 10*3/uL (ref 4.0–10.3)

## 2014-09-20 MED ORDER — DEXAMETHASONE SODIUM PHOSPHATE 10 MG/ML IJ SOLN
10.0000 mg | Freq: Once | INTRAMUSCULAR | Status: AC
  Administered 2014-09-20: 10 mg via INTRAVENOUS

## 2014-09-20 MED ORDER — DEXAMETHASONE SODIUM PHOSPHATE 10 MG/ML IJ SOLN
INTRAMUSCULAR | Status: AC
  Filled 2014-09-20: qty 1

## 2014-09-20 MED ORDER — OXALIPLATIN CHEMO INJECTION 100 MG/20ML
63.7500 mg/m2 | Freq: Once | INTRAVENOUS | Status: AC
  Administered 2014-09-20: 130 mg via INTRAVENOUS
  Filled 2014-09-20: qty 26

## 2014-09-20 MED ORDER — ONDANSETRON 8 MG/NS 50 ML IVPB
INTRAVENOUS | Status: AC
  Filled 2014-09-20: qty 8

## 2014-09-20 MED ORDER — DEXTROSE 5 % IV SOLN
Freq: Once | INTRAVENOUS | Status: AC
  Administered 2014-09-20: 11:00:00 via INTRAVENOUS

## 2014-09-20 MED ORDER — ONDANSETRON 8 MG/50ML IVPB (CHCC)
8.0000 mg | Freq: Once | INTRAVENOUS | Status: AC
  Administered 2014-09-20: 8 mg via INTRAVENOUS

## 2014-09-20 MED ORDER — FLUOROURACIL CHEMO INJECTION 2.5 GM/50ML
400.0000 mg/m2 | Freq: Once | INTRAVENOUS | Status: AC
  Administered 2014-09-20: 800 mg via INTRAVENOUS
  Filled 2014-09-20: qty 16

## 2014-09-20 MED ORDER — SODIUM CHLORIDE 0.9 % IV SOLN
2400.0000 mg/m2 | INTRAVENOUS | Status: DC
  Administered 2014-09-20: 4850 mg via INTRAVENOUS
  Filled 2014-09-20: qty 97

## 2014-09-20 MED ORDER — LEUCOVORIN CALCIUM INJECTION 350 MG
394.0000 mg/m2 | Freq: Once | INTRAVENOUS | Status: AC
  Administered 2014-09-20: 800 mg via INTRAVENOUS
  Filled 2014-09-20: qty 40

## 2014-09-20 NOTE — Progress Notes (Signed)
Hematology and Oncology Follow Up Visit  Jorge Ross [id number] March 12, 1941 74 y.o. 09/20/2014 10:13 AM [name], MDClark, Don Broach, MD   Principle Diagnosis: 74 year old with colon cancer diagnosed in September 2015. He presented with a cecal mass and found to have a T1 N1 disease. He also has T1 N0 renal cell carcinoma diagnosed at around the same time.   Prior Therapy: He is status post right colectomy done laparoscopically on 04/01/2014. He had a T1 N1 disease was 0 out of 12 lymph nodes involved. He also status post partial nephrectomy on 04/01/2014 for a papillary tumor.  Current therapy: FOLFOX adjuvant chemotherapy cycle 1 to given on 05/31/2014. Status post 6 cycles  Interim History:  Jorge Ross presents today for a follow-up visit with his wife. Since last visit, he is not reporting any new issues. He is tolerating his chemotherapy without complications. He has noticed a skin rash develop on his upper back and neck, chest and upper abdomen which have resolved at this time. His appetite have suffered because of chemotherapy but is able to find certain foods though still tastes good. He enjoys oranges among other fruits. He is not reporting any peripheral neuropathy, fatigue, tiredness or change in his performance status. He reports nausea as well as decreased appetite.  He does not report any headaches, blurry vision, syncope. He does not report any chest pain, shortness of breath, cough. He does not report any lower extremity edema or palpitation. He does not report any nausea, vomiting, constipation, diarrhea or hematochezia. He does not report any frequency, urgency or hesitancy. He does not report any skeletal complaints. Remainder of his review of systems unremarkable.  Medications: I have reviewed the patient's current medications.  Current Outpatient Prescriptions  Medication Sig Dispense Refill  . acetaminophen (TYLENOL) 500 MG tablet Take 500 mg by mouth every 6 (six)  hours as needed.    Marland Kitchen aspirin 81 MG tablet Take 81 mg by mouth daily.      . baclofen (LIORESAL) 10 MG tablet   0  . HYDROcodone-acetaminophen (NORCO/VICODIN) 5-325 MG per tablet Take 1-2 tablets by mouth every 6 (six) hours as needed for moderate pain or severe pain. 40 tablet 0  . irbesartan-hydrochlorothiazide (AVALIDE) 150-12.5 MG per tablet Take 1 tablet by mouth every morning.     . lidocaine-prilocaine (EMLA) cream Apply 1 application topically as needed.  0  . Multiple Vitamin (MULTIVITAMIN) capsule Take 1 capsule by mouth daily.      . ondansetron (ZOFRAN) 8 MG tablet Take 1 tablet (8 mg total) by mouth every 8 (eight) hours as needed. 30 tablet 3  . vitamin E 400 UNIT capsule Take 400 Units by mouth daily.      No current facility-administered medications for this visit.     Allergies:  Allergies  Allergen Reactions  . Codeine     constipation    Past Medical History, Surgical history, Social history, and Family History were reviewed and updated.   Physical Exam: Blood pressure 122/62, pulse 67, temperature 98 F (36.7 C), temperature source Oral, resp. rate 20, height 6' (1.829 m), weight 177 lb 3.2 oz (80.377 kg). ECOG: 0 General appearance: alert and cooperative Head: Normocephalic, without obvious abnormality Neck: no adenopathy Lymph nodes: Cervical, supraclavicular, and axillary nodes normal. Heart:regular rate and rhythm, S1, S2 normal, no murmur, click, rub or gallop Lung:chest clear, no wheezing, rales, normal symmetric air entry.  Abdomin: soft, non-tender, without masses or organomegaly EXT:no erythema, induration, or nodules Skin:  Reveals hyperpigmented skin rash that is very faint and seems to be improving.  Lab Results: Lab Results  Component Value Date   WBC 8.6 09/20/2014   HGB 11.9* 09/20/2014   HCT 35.1* 09/20/2014   MCV 97.5 09/20/2014   PLT 115* 09/20/2014     Chemistry      Component Value Date/Time   NA 141 09/06/2014 0920   NA 141  05/23/2014 1549   K 3.6 09/06/2014 0920   K 4.1 05/23/2014 1549   CL 104 05/23/2014 1549   CO2 25 09/06/2014 0920   CO2 26 05/23/2014 1549   BUN 16.1 09/06/2014 0920   BUN 17 05/23/2014 1549   CREATININE 0.9 09/06/2014 0920   CREATININE 0.78 05/23/2014 1549      Component Value Date/Time   CALCIUM 9.2 09/06/2014 0920   CALCIUM 9.4 05/23/2014 1549   ALKPHOS 115 09/06/2014 0920   ALKPHOS 55 02/15/2014 1121   AST 35* 09/06/2014 0920   AST 24 02/15/2014 1121   ALT 41 09/06/2014 0920   ALT 21 02/15/2014 1121   BILITOT 0.86 09/06/2014 0920   BILITOT 1.0 02/15/2014 1121        EXAM: CT ABDOMEN AND PELVIS WITHOUT AND WITH CONTRAST  TECHNIQUE: Multidetector CT imaging of the abdomen and pelvis was performed following the standard protocol before and following the bolus administration of intravenous contrast.  CONTRAST: 133mL OMNIPAQUE IOHEXOL 300 MG/ML SOLN  COMPARISON: 02/17/2014 chest abdomen pelvic CT. Abdominal MRI of 02/17/2014.  FINDINGS: Lower chest: Clear lung bases. Normal heart size without pericardial or pleural effusion.  Hepatobiliary: Normal liver. Normal gallbladder, without biliary ductal dilatation.  Pancreas: Normal, without mass or ductal dilatation.  Spleen: Normal  Adrenals/Urinary Tract: Normal adrenal glands. No renal calculi or hydronephrosis. Too small to characterize right renal lesions. Interpolar left renal cyst approximately 9 mm.  Hypoattenuation about the lateral aspect interpolar left kidney on image 48 is at the site of wedge resection of the previously described renal cell carcinoma. No evidence of solid enhancing portions to suggest recurrent or residual disease. No hydronephrosis. Normal ureters and urinary bladder. No renal collecting system complication.  Stomach/Bowel: Normal stomach, without wall thickening. Partial right hemicolectomy. Otherwise normal appearance of the colon. Normal small  bowel.  Vascular/Lymphatic: 2 left renal arteries. No abdominal aortic aneurysm. No abdominopelvic adenopathy.  Reproductive: Mild prostatomegaly.  Other: No significant free fluid. Tiny fat containing paraumbilical hernia. Left paracentral.  Musculoskeletal: Left ischial bone island. Degenerative disc disease, including at the lumbosacral junction. Disc bulges at L3-4 and L4-5  IMPRESSION: 1. Status post left partial nephrectomy, without residual or recurrent disease. 2. Status post partial right hemicolectomy, without recurrent or metastatic disease.   Impression and Plan:   74 year old gentleman with the following issues:  1. 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 He is status post laparoscopic right colectomy done on 04/01/2014. He has T1 N1 disease with 1 out of 12 lymph nodes involved. He is now status post 6 cycles of adjuvant FOLFOX. The plan is to proceed with cycle 7 today with 25% dose reduction of oxaliplatin due to thrombocytopenia. He had done very well with chemotherapy and likely will require 8-12 cycles of adjuvant therapy. He understands he might not able to get all 12 cycles but we'll try to get him as closely as possible. His CT scan on 08/05/2014 did not show any evidence of recurrent disease.  2. IV access: Port-A-Cath has been inserted without any complications.  3. Antiemetics:  Patient has prescription for Zofran available.  4. Renal cell carcinoma status post resection for a T1 N0 disease. The histology showed papillary type.  5. Skin rash: Likely related to his current chemotherapy. Seems to be improving.  6. Neutropenia prophylaxis: He'll receive Neulasta after each cycle.  7. Follow-up: Will be in 2 weeks for cycle 8.  [facility], MD  2/23/201610:13 AM

## 2014-09-20 NOTE — Telephone Encounter (Signed)
added appt sched sched gv pt appt

## 2014-09-20 NOTE — Patient Instructions (Signed)
[facility] Discharge Instructions for Patients Receiving Chemotherapy  Today you received the following chemotherapy agents Oxaliplatin, Leucovorin and 5FU.  To help prevent nausea and vomiting after your treatment, we encourage you to take your nausea medication.   If you develop nausea and vomiting that is not controlled by your nausea medication, call the clinic.   BELOW ARE SYMPTOMS THAT SHOULD BE REPORTED IMMEDIATELY:  *FEVER GREATER THAN 100.5 F  *CHILLS WITH OR WITHOUT FEVER  NAUSEA AND VOMITING THAT IS NOT CONTROLLED WITH YOUR NAUSEA MEDICATION  *UNUSUAL SHORTNESS OF BREATH  *UNUSUAL BRUISING OR BLEEDING  TENDERNESS IN MOUTH AND THROAT WITH OR WITHOUT PRESENCE OF ULCERS  *URINARY PROBLEMS  *BOWEL PROBLEMS  UNUSUAL RASH Items with * indicate a potential emergency and should be followed up as soon as possible.  Feel free to call the clinic you have any questions or concerns. The clinic phone number is [phone].

## 2014-09-20 NOTE — Telephone Encounter (Signed)
Gave avs & calendar for March. Sent message to schedule treatment. °

## 2014-09-22 ENCOUNTER — Ambulatory Visit: Payer: Medicare Other

## 2014-09-22 ENCOUNTER — Ambulatory Visit (HOSPITAL_BASED_OUTPATIENT_CLINIC_OR_DEPARTMENT_OTHER): Payer: Medicare Other

## 2014-09-22 DIAGNOSIS — Z5189 Encounter for other specified aftercare: Secondary | ICD-10-CM

## 2014-09-22 DIAGNOSIS — C189 Malignant neoplasm of colon, unspecified: Secondary | ICD-10-CM

## 2014-09-22 DIAGNOSIS — C779 Secondary and unspecified malignant neoplasm of lymph node, unspecified: Secondary | ICD-10-CM

## 2014-09-22 DIAGNOSIS — C180 Malignant neoplasm of cecum: Secondary | ICD-10-CM

## 2014-09-22 MED ORDER — SODIUM CHLORIDE 0.9 % IJ SOLN
10.0000 mL | INTRAMUSCULAR | Status: DC | PRN
  Administered 2014-09-22: 10 mL
  Filled 2014-09-22: qty 10

## 2014-09-22 MED ORDER — HEPARIN SOD (PORK) LOCK FLUSH 100 UNIT/ML IV SOLN
500.0000 [IU] | Freq: Once | INTRAVENOUS | Status: AC | PRN
  Administered 2014-09-22: 500 [IU]
  Filled 2014-09-22: qty 5

## 2014-09-22 MED ORDER — PEGFILGRASTIM INJECTION 6 MG/0.6ML ~~LOC~~
6.0000 mg | PREFILLED_SYRINGE | Freq: Once | SUBCUTANEOUS | Status: AC
  Administered 2014-09-22: 6 mg via SUBCUTANEOUS
  Filled 2014-09-22: qty 0.6

## 2014-09-22 NOTE — Patient Instructions (Signed)
Pegfilgrastim injection What is this medicine? PEGFILGRASTIM (peg fil GRA stim) is a long-acting granulocyte colony-stimulating factor that stimulates the growth of neutrophils, a type of white blood cell important in the body's fight against infection. It is used to reduce the incidence of fever and infection in patients with certain types of cancer who are receiving chemotherapy that affects the bone marrow. This medicine may be used for other purposes; ask your health care provider or pharmacist if you have questions. COMMON BRAND NAME(S): Neulasta What should I tell my health care provider before I take this medicine? They need to know if you have any of these conditions: -latex allergy -ongoing radiation therapy -sickle cell disease -skin reactions to acrylic adhesives (On-Body Injector only) -an unusual or allergic reaction to pegfilgrastim, filgrastim, other medicines, foods, dyes, or preservatives -pregnant or trying to get pregnant -breast-feeding How should I use this medicine? This medicine is for injection under the skin. If you get this medicine at home, you will be taught how to prepare and give the pre-filled syringe or how to use the On-body Injector. Refer to the patient Instructions for Use for detailed instructions. Use exactly as directed. Take your medicine at regular intervals. Do not take your medicine more often than directed. It is important that you put your used needles and syringes in a special sharps container. Do not put them in a trash can. If you do not have a sharps container, call your pharmacist or healthcare provider to get one. Talk to your pediatrician regarding the use of this medicine in children. Special care may be needed. Overdosage: If you think you have taken too much of this medicine contact a poison control center or emergency room at once. NOTE: This medicine is only for you. Do not share this medicine with others. What if I miss a dose? It is  important not to miss your dose. Call your doctor or health care professional if you miss your dose. If you miss a dose due to an On-body Injector failure or leakage, a new dose should be administered as soon as possible using a single prefilled syringe for manual use. What may interact with this medicine? Interactions have not been studied. Give your health care provider a list of all the medicines, herbs, non-prescription drugs, or dietary supplements you use. Also tell them if you smoke, drink alcohol, or use illegal drugs. Some items may interact with your medicine. This list may not describe all possible interactions. Give your health care provider a list of all the medicines, herbs, non-prescription drugs, or dietary supplements you use. Also tell them if you smoke, drink alcohol, or use illegal drugs. Some items may interact with your medicine. What should I watch for while using this medicine? You may need blood work done while you are taking this medicine. If you are going to need a MRI, CT scan, or other procedure, tell your doctor that you are using this medicine (On-Body Injector only). What side effects may I notice from receiving this medicine? Side effects that you should report to your doctor or health care professional as soon as possible: -allergic reactions like skin rash, itching or hives, swelling of the face, lips, or tongue -dizziness -fever -pain, redness, or irritation at site where injected -pinpoint red spots on the skin -shortness of breath or breathing problems -stomach or side pain, or pain at the shoulder -swelling -tiredness -trouble passing urine Side effects that usually do not require medical attention (report to your doctor   or health care professional if they continue or are bothersome): -bone pain -muscle pain This list may not describe all possible side effects. Call your doctor for medical advice about side effects. You may report side effects to FDA at  1-800-FDA-1088. Where should I keep my medicine? Keep out of the reach of children. Store pre-filled syringes in a refrigerator between 2 and 8 degrees C (36 and 46 degrees F). Do not freeze. Keep in carton to protect from light. Throw away this medicine if it is left out of the refrigerator for more than 48 hours. Throw away any unused medicine after the expiration date. NOTE: This sheet is a summary. It may not cover all possible information. If you have questions about this medicine, talk to your doctor, pharmacist, or health care provider.  2015, [employer]vier/Gold Standard. (2013-10-14 16:14:05)  

## 2014-09-22 NOTE — Progress Notes (Signed)
Neulasta injection given by flush nurse 

## 2014-10-04 ENCOUNTER — Ambulatory Visit (HOSPITAL_BASED_OUTPATIENT_CLINIC_OR_DEPARTMENT_OTHER): Payer: Medicare Other | Admitting: Nurse Practitioner

## 2014-10-04 ENCOUNTER — Telehealth: Payer: Self-pay | Admitting: Oncology

## 2014-10-04 ENCOUNTER — Ambulatory Visit (HOSPITAL_BASED_OUTPATIENT_CLINIC_OR_DEPARTMENT_OTHER): Payer: Medicare Other

## 2014-10-04 ENCOUNTER — Other Ambulatory Visit (HOSPITAL_BASED_OUTPATIENT_CLINIC_OR_DEPARTMENT_OTHER): Payer: Medicare Other

## 2014-10-04 VITALS — BP 111/63 | HR 70 | Temp 98.4°F | Resp 20 | Ht 72.0 in | Wt 173.2 lb

## 2014-10-04 DIAGNOSIS — C180 Malignant neoplasm of cecum: Secondary | ICD-10-CM

## 2014-10-04 DIAGNOSIS — D6959 Other secondary thrombocytopenia: Secondary | ICD-10-CM

## 2014-10-04 DIAGNOSIS — Z5111 Encounter for antineoplastic chemotherapy: Secondary | ICD-10-CM

## 2014-10-04 DIAGNOSIS — C189 Malignant neoplasm of colon, unspecified: Secondary | ICD-10-CM

## 2014-10-04 DIAGNOSIS — C642 Malignant neoplasm of left kidney, except renal pelvis: Secondary | ICD-10-CM

## 2014-10-04 LAB — CBC WITH DIFFERENTIAL/PLATELET
BASO%: 0.5 % (ref 0.0–2.0)
BASOS ABS: 0 10*3/uL (ref 0.0–0.1)
EOS ABS: 0 10*3/uL (ref 0.0–0.5)
EOS%: 0.7 % (ref 0.0–7.0)
HCT: 36.5 % — ABNORMAL LOW (ref 38.4–49.9)
HEMOGLOBIN: 11.9 g/dL — AB (ref 13.0–17.1)
LYMPH%: 20.1 % (ref 14.0–49.0)
MCH: 33 pg (ref 27.2–33.4)
MCHC: 32.5 g/dL (ref 32.0–36.0)
MCV: 101.7 fL — ABNORMAL HIGH (ref 79.3–98.0)
MONO#: 0.7 10*3/uL (ref 0.1–0.9)
MONO%: 15.8 % — ABNORMAL HIGH (ref 0.0–14.0)
NEUT%: 62.9 % (ref 39.0–75.0)
NEUTROS ABS: 2.9 10*3/uL (ref 1.5–6.5)
Platelets: 119 10*3/uL — ABNORMAL LOW (ref 140–400)
RBC: 3.59 10*6/uL — AB (ref 4.20–5.82)
RDW: 18.3 % — AB (ref 11.0–14.6)
WBC: 4.6 10*3/uL (ref 4.0–10.3)
lymph#: 0.9 10*3/uL (ref 0.9–3.3)

## 2014-10-04 LAB — COMPREHENSIVE METABOLIC PANEL (CC13)
ALT: 59 U/L — AB (ref 0–55)
AST: 42 U/L — AB (ref 5–34)
Albumin: 4 g/dL (ref 3.5–5.0)
Alkaline Phosphatase: 123 U/L (ref 40–150)
Anion Gap: 12 mEq/L — ABNORMAL HIGH (ref 3–11)
BUN: 15 mg/dL (ref 7.0–26.0)
CALCIUM: 9.5 mg/dL (ref 8.4–10.4)
CO2: 25 mEq/L (ref 22–29)
Chloride: 103 mEq/L (ref 98–109)
Creatinine: 1 mg/dL (ref 0.7–1.3)
EGFR: 84 mL/min/{1.73_m2} — ABNORMAL LOW (ref >90)
Glucose: 104 mg/dl (ref 70–140)
Potassium: 4.3 mEq/L (ref 3.5–5.1)
Sodium: 140 mEq/L (ref 136–145)
Total Bilirubin: 0.79 mg/dL (ref 0.20–1.20)
Total Protein: 7.1 g/dL (ref 6.4–8.3)

## 2014-10-04 MED ORDER — DEXTROSE 5 % IV SOLN
Freq: Once | INTRAVENOUS | Status: AC
  Administered 2014-10-04: 13:00:00 via INTRAVENOUS

## 2014-10-04 MED ORDER — FLUOROURACIL CHEMO INJECTION 2.5 GM/50ML
400.0000 mg/m2 | Freq: Once | INTRAVENOUS | Status: AC
  Administered 2014-10-04: 800 mg via INTRAVENOUS
  Filled 2014-10-04: qty 16

## 2014-10-04 MED ORDER — SODIUM CHLORIDE 0.9 % IV SOLN
Freq: Once | INTRAVENOUS | Status: AC
  Administered 2014-10-04: 13:00:00 via INTRAVENOUS
  Filled 2014-10-04: qty 4

## 2014-10-04 MED ORDER — OXALIPLATIN CHEMO INJECTION 100 MG/20ML
63.7500 mg/m2 | Freq: Once | INTRAVENOUS | Status: AC
  Administered 2014-10-04: 130 mg via INTRAVENOUS
  Filled 2014-10-04: qty 26

## 2014-10-04 MED ORDER — SODIUM CHLORIDE 0.9 % IV SOLN
2400.0000 mg/m2 | INTRAVENOUS | Status: DC
  Administered 2014-10-04: 4850 mg via INTRAVENOUS
  Filled 2014-10-04: qty 97

## 2014-10-04 MED ORDER — LEUCOVORIN CALCIUM INJECTION 350 MG
394.0000 mg/m2 | Freq: Once | INTRAVENOUS | Status: AC
  Administered 2014-10-04: 800 mg via INTRAVENOUS
  Filled 2014-10-04: qty 40

## 2014-10-04 NOTE — Patient Instructions (Signed)
[facility] Discharge Instructions for Patients Receiving Chemotherapy  Today you received the following chemotherapy agents: Oxaliplatin, Leucovorin, 5FU  To help prevent nausea and vomiting after your treatment, we encourage you to take your nausea medication:Zofran 8 mg every 8 hours as needed.   If you develop nausea and vomiting that is not controlled by your nausea medication, call the clinic.   BELOW ARE SYMPTOMS THAT SHOULD BE REPORTED IMMEDIATELY:  *FEVER GREATER THAN 100.5 F  *CHILLS WITH OR WITHOUT FEVER  NAUSEA AND VOMITING THAT IS NOT CONTROLLED WITH YOUR NAUSEA MEDICATION  *UNUSUAL SHORTNESS OF BREATH  *UNUSUAL BRUISING OR BLEEDING  TENDERNESS IN MOUTH AND THROAT WITH OR WITHOUT PRESENCE OF ULCERS  *URINARY PROBLEMS  *BOWEL PROBLEMS  UNUSUAL RASH Items with * indicate a potential emergency and should be followed up as soon as possible.  Feel free to call the clinic you have any questions or concerns. The clinic phone number is [phone].

## 2014-10-04 NOTE — Progress Notes (Signed)
  [facility] OFFICE PROGRESS NOTE   Diagnosis: Colon cancer   INTERVAL HISTORY:   [name] returns as scheduled. He completed cycle 7 FOLFOX on 09/20/2014. He denies nausea/vomiting. No mouth sores. No diarrhea. The cold sensitivity lasted about 5 days. No persistent neuropathy symptoms. He has an alteration in taste. He continues to note a "rash" at the upper back and neck. His main complaint today is "achy" joint pain following the Neulasta injection. For the pain he is taking ibuprofen 1 tablet a day.  Objective:  Vital signs in last 24 hours:  Blood pressure 111/63, pulse 70, temperature 98.4 F (36.9 C), temperature source Oral, resp. rate 20, height 6' (1.829 m), weight 173 lb 3.2 oz (78.563 kg), SpO2 100 %.    HEENT: No thrush or ulcers. Mucous membranes are moist. Resp: Lungs clear bilaterally.  Cardio: Regular rate and rhythm. GI: Abdomen soft and nontender. No hepatomegaly. Vascular: No leg edema. Neuro: Vibratory sense mildly decreased over the fingertips per tuning fork exam.  Skin: Skin thickening over the palms. The palms have a dry appearance. No skin breakdown. Hyperpigmented skin rash at the upper back and chest.  Port-A-Cath without erythema.  Lab Results:  Lab Results  Component Value Date   WBC 4.6 10/04/2014   HGB 11.9* 10/04/2014   HCT 36.5* 10/04/2014   MCV 101.7* 10/04/2014   PLT 119* 10/04/2014   NEUTROABS 2.9 10/04/2014    Imaging:  No results found.  Medications: I have reviewed the patient's current medications.  Assessment/Plan: 1. 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 Status post laparoscopic right colectomy 04/01/2014; T1 N1 disease with 1 out of 12 lymph nodes involved. Adjuvant FOLFOX chemotherapy initiated 05/31/2014. 2. Renal cell carcinoma status post resection 04/01/2014 for T1 N0 disease; histology showed papillary type. 3. Port-A-Cath placement 05/24/2014. 4. Joint pain following  Neulasta. 5. Hyperpigmented skin rash likely related to 5-fluorouracil. 6. 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 Stable.   Disposition: Jorge Ross appears stable. He has completed 7 cycles of FOLFOX. Plan to proceed with cycle 8 today as scheduled.  He is having joint pain following Neulasta. He was instructed to increase the ibuprofen to 600 mg 3 times daily as needed. He will let us know if this is not effective.  He will return for a follow-up visit and cycle 9 FOLFOX in 2 weeks. He will contact the office in the interim as outlined above or with any other problems.  Plan reviewed with Dr. Alen Blew.  Ned Card ANP/GNP-BC   10/04/2014  10:27 AM

## 2014-10-04 NOTE — Patient Instructions (Signed)
Ibuprofen 600 mg every 8 hours as needed

## 2014-10-04 NOTE — Telephone Encounter (Signed)
gv adn printed appt sched and avs for pt for March and April....sed added tx.    °

## 2014-10-06 ENCOUNTER — Ambulatory Visit: Payer: Medicare Other

## 2014-10-06 ENCOUNTER — Ambulatory Visit (HOSPITAL_BASED_OUTPATIENT_CLINIC_OR_DEPARTMENT_OTHER): Payer: Medicare Other

## 2014-10-06 DIAGNOSIS — Z5189 Encounter for other specified aftercare: Secondary | ICD-10-CM

## 2014-10-06 DIAGNOSIS — C180 Malignant neoplasm of cecum: Secondary | ICD-10-CM

## 2014-10-06 DIAGNOSIS — C189 Malignant neoplasm of colon, unspecified: Secondary | ICD-10-CM

## 2014-10-06 MED ORDER — PEGFILGRASTIM INJECTION 6 MG/0.6ML ~~LOC~~
6.0000 mg | PREFILLED_SYRINGE | Freq: Once | SUBCUTANEOUS | Status: AC
  Administered 2014-10-06: 6 mg via SUBCUTANEOUS
  Filled 2014-10-06: qty 0.6

## 2014-10-06 MED ORDER — HEPARIN SOD (PORK) LOCK FLUSH 100 UNIT/ML IV SOLN
500.0000 [IU] | Freq: Once | INTRAVENOUS | Status: AC | PRN
  Administered 2014-10-06: 500 [IU]
  Filled 2014-10-06: qty 5

## 2014-10-06 MED ORDER — SODIUM CHLORIDE 0.9 % IJ SOLN
10.0000 mL | INTRAMUSCULAR | Status: DC | PRN
  Administered 2014-10-06: 10 mL
  Filled 2014-10-06: qty 10

## 2014-10-06 NOTE — Patient Instructions (Signed)
Pegfilgrastim injection What is this medicine? PEGFILGRASTIM (peg fil GRA stim) is a long-acting granulocyte colony-stimulating factor that stimulates the growth of neutrophils, a type of white blood cell important in the body's fight against infection. It is used to reduce the incidence of fever and infection in patients with certain types of cancer who are receiving chemotherapy that affects the bone marrow. This medicine may be used for other purposes; ask your health care provider or pharmacist if you have questions. COMMON BRAND NAME(S): Neulasta What should I tell my health care provider before I take this medicine? They need to know if you have any of these conditions: -latex allergy -ongoing radiation therapy -sickle cell disease -skin reactions to acrylic adhesives (On-Body Injector only) -an unusual or allergic reaction to pegfilgrastim, filgrastim, other medicines, foods, dyes, or preservatives -pregnant or trying to get pregnant -breast-feeding How should I use this medicine? This medicine is for injection under the skin. If you get this medicine at home, you will be taught how to prepare and give the pre-filled syringe or how to use the On-body Injector. Refer to the patient Instructions for Use for detailed instructions. Use exactly as directed. Take your medicine at regular intervals. Do not take your medicine more often than directed. It is important that you put your used needles and syringes in a special sharps container. Do not put them in a trash can. If you do not have a sharps container, call your pharmacist or healthcare provider to get one. Talk to your pediatrician regarding the use of this medicine in children. Special care may be needed. Overdosage: If you think you have taken too much of this medicine contact a poison control center or emergency room at once. NOTE: This medicine is only for you. Do not share this medicine with others. What if I miss a dose? It is  important not to miss your dose. Call your doctor or health care professional if you miss your dose. If you miss a dose due to an On-body Injector failure or leakage, a new dose should be administered as soon as possible using a single prefilled syringe for manual use. What may interact with this medicine? Interactions have not been studied. Give your health care provider a list of all the medicines, herbs, non-prescription drugs, or dietary supplements you use. Also tell them if you smoke, drink alcohol, or use illegal drugs. Some items may interact with your medicine. This list may not describe all possible interactions. Give your health care provider a list of all the medicines, herbs, non-prescription drugs, or dietary supplements you use. Also tell them if you smoke, drink alcohol, or use illegal drugs. Some items may interact with your medicine. What should I watch for while using this medicine? You may need blood work done while you are taking this medicine. If you are going to need a MRI, CT scan, or other procedure, tell your doctor that you are using this medicine (On-Body Injector only). What side effects may I notice from receiving this medicine? Side effects that you should report to your doctor or health care professional as soon as possible: -allergic reactions like skin rash, itching or hives, swelling of the face, lips, or tongue -dizziness -fever -pain, redness, or irritation at site where injected -pinpoint red spots on the skin -shortness of breath or breathing problems -stomach or side pain, or pain at the shoulder -swelling -tiredness -trouble passing urine Side effects that usually do not require medical attention (report to your doctor   or health care professional if they continue or are bothersome): -bone pain -muscle pain This list may not describe all possible side effects. Call your doctor for medical advice about side effects. You may report side effects to FDA at  1-800-FDA-1088. Where should I keep my medicine? Keep out of the reach of children. Store pre-filled syringes in a refrigerator between 2 and 8 degrees C (36 and 46 degrees F). Do not freeze. Keep in carton to protect from light. Throw away this medicine if it is left out of the refrigerator for more than 48 hours. Throw away any unused medicine after the expiration date. NOTE: This sheet is a summary. It may not cover all possible information. If you have questions about this medicine, talk to your doctor, pharmacist, or health care provider.  2015, [employer]vier/Gold Standard. (2013-10-14 16:14:05)  

## 2014-10-06 NOTE — Progress Notes (Signed)
Neulasta injection given by flush nurse 

## 2014-10-18 ENCOUNTER — Telehealth: Payer: Self-pay | Admitting: Oncology

## 2014-10-18 ENCOUNTER — Ambulatory Visit: Payer: Medicare Other

## 2014-10-18 ENCOUNTER — Ambulatory Visit (HOSPITAL_BASED_OUTPATIENT_CLINIC_OR_DEPARTMENT_OTHER): Payer: Medicare Other | Admitting: Oncology

## 2014-10-18 ENCOUNTER — Other Ambulatory Visit (HOSPITAL_BASED_OUTPATIENT_CLINIC_OR_DEPARTMENT_OTHER): Payer: Medicare Other

## 2014-10-18 VITALS — BP 126/55 | HR 80 | Temp 97.6°F | Resp 18 | Ht 72.0 in | Wt 175.0 lb

## 2014-10-18 DIAGNOSIS — C180 Malignant neoplasm of cecum: Secondary | ICD-10-CM | POA: Diagnosis not present

## 2014-10-18 DIAGNOSIS — C649 Malignant neoplasm of unspecified kidney, except renal pelvis: Secondary | ICD-10-CM

## 2014-10-18 DIAGNOSIS — D6959 Other secondary thrombocytopenia: Secondary | ICD-10-CM | POA: Diagnosis not present

## 2014-10-18 DIAGNOSIS — C189 Malignant neoplasm of colon, unspecified: Secondary | ICD-10-CM

## 2014-10-18 LAB — COMPREHENSIVE METABOLIC PANEL (CC13)
ALK PHOS: 116 U/L (ref 40–150)
ALT: 108 U/L — ABNORMAL HIGH (ref 0–55)
AST: 63 U/L — AB (ref 5–34)
Albumin: 4 g/dL (ref 3.5–5.0)
Anion Gap: 12 mEq/L — ABNORMAL HIGH (ref 3–11)
BUN: 12.3 mg/dL (ref 7.0–26.0)
CHLORIDE: 102 meq/L (ref 98–109)
CO2: 24 mEq/L (ref 22–29)
Calcium: 9.2 mg/dL (ref 8.4–10.4)
Creatinine: 1.1 mg/dL (ref 0.7–1.3)
EGFR: 78 mL/min/{1.73_m2} — ABNORMAL LOW (ref >90)
Glucose: 100 mg/dl (ref 70–140)
Potassium: 3.5 mEq/L (ref 3.5–5.1)
Sodium: 139 mEq/L (ref 136–145)
Total Bilirubin: 0.95 mg/dL (ref 0.20–1.20)
Total Protein: 6.9 g/dL (ref 6.4–8.3)

## 2014-10-18 LAB — CBC WITH DIFFERENTIAL/PLATELET
BASO%: 0.4 % (ref 0.0–2.0)
BASOS ABS: 0 10*3/uL (ref 0.0–0.1)
EOS%: 0.6 % (ref 0.0–7.0)
Eosinophils Absolute: 0 10*3/uL (ref 0.0–0.5)
HCT: 32.3 % — ABNORMAL LOW (ref 38.4–49.9)
HGB: 10.9 g/dL — ABNORMAL LOW (ref 13.0–17.1)
LYMPH%: 28.7 % (ref 14.0–49.0)
MCH: 34.4 pg — AB (ref 27.2–33.4)
MCHC: 33.7 g/dL (ref 32.0–36.0)
MCV: 101.9 fL — AB (ref 79.3–98.0)
MONO#: 0.6 10*3/uL (ref 0.1–0.9)
MONO%: 11.3 % (ref 0.0–14.0)
NEUT%: 59 % (ref 39.0–75.0)
NEUTROS ABS: 2.9 10*3/uL (ref 1.5–6.5)
Platelets: 80 10*3/uL — ABNORMAL LOW (ref 140–400)
RBC: 3.17 10*6/uL — AB (ref 4.20–5.82)
RDW: 17.3 % — AB (ref 11.0–14.6)
WBC: 4.9 10*3/uL (ref 4.0–10.3)
lymph#: 1.4 10*3/uL (ref 0.9–3.3)

## 2014-10-18 NOTE — Telephone Encounter (Signed)
gave and printed appt sched and avs for pt for March adn April....sed added tx.

## 2014-10-18 NOTE — Progress Notes (Signed)
Hematology and Oncology Follow Up Visit  Jorge Ross [id number] 07-29-1941 74 y.o. 10/18/2014 9:29 AM Jorge Ross, MDClark, Don Broach, MD   Principle Diagnosis: 74 year old with colon cancer diagnosed in September 2015. He presented with a cecal mass and found to have a T1 N1 disease. He also has T1 N0 renal cell carcinoma diagnosed at around the same time.   Prior Therapy: He is status post right colectomy done laparoscopically on 04/01/2014. He had a T1 N1 disease was 0 out of 12 lymph nodes involved. He also status post partial nephrectomy on 04/01/2014 for a papillary tumor.  Current therapy: FOLFOX adjuvant chemotherapy cycle 1 to given on 05/31/2014. Status post 8 cycles  Interim History:  [name] presents today for a follow-up visit with his wife. Since last visit, he is doing relatively well. He is tolerating his chemotherapy with very few complications. He has noticed a skin rash which has subsided. His appetite have suffered because of chemotherapy but is able to find certain foods though still tastes good. He enjoys oranges among other fruits. He is not reporting any peripheral neuropathy, fatigue, tiredness or change in his performance status. He reports nausea as well as decreased appetite.  He does not report any headaches, blurry vision, syncope. He does not report any chest pain, shortness of breath, cough. He does not report any lower extremity edema or palpitation. He does not report any nausea, vomiting, constipation, diarrhea or hematochezia. He does not report any frequency, urgency or hesitancy. He does not report any skeletal complaints. Remainder of his review of systems unremarkable.  Medications: I have reviewed the patient's current medications.  Current Outpatient Prescriptions  Medication Sig Dispense Refill  . acetaminophen (TYLENOL) 500 MG tablet Take 500 mg by mouth every 6 (six) hours as needed.    Marland Kitchen aspirin 81 MG tablet Take 81 mg by mouth daily.      .  baclofen (LIORESAL) 10 MG tablet   0  . HYDROcodone-acetaminophen (NORCO/VICODIN) 5-325 MG per tablet Take 1-2 tablets by mouth every 6 (six) hours as needed for moderate pain or severe pain. 40 tablet 0  . irbesartan-hydrochlorothiazide (AVALIDE) 150-12.5 MG per tablet Take 1 tablet by mouth every morning.     . lidocaine-prilocaine (EMLA) cream Apply 1 application topically as needed.  0  . Multiple Vitamin (MULTIVITAMIN) capsule Take 1 capsule by mouth daily.      . ondansetron (ZOFRAN) 8 MG tablet Take 1 tablet (8 mg total) by mouth every 8 (eight) hours as needed. 30 tablet 3  . vitamin E 400 UNIT capsule Take 400 Units by mouth daily.      No current facility-administered medications for this visit.     Allergies:  Allergies  Allergen Reactions  . Codeine     constipation    Past Medical History, Surgical history, Social history, and Family History were reviewed and updated.   Physical Exam: Blood pressure 126/55, pulse 80, temperature 97.6 F (36.4 C), temperature source Oral, resp. rate 18, height 6' (1.829 m), weight 175 lb (79.379 kg), SpO2 98 %. ECOG: 0 General appearance: alert and cooperative Head: Normocephalic, without obvious abnormality Neck: no adenopathy Lymph nodes: Cervical, supraclavicular, and axillary nodes normal. Heart:regular rate and rhythm, S1, S2 normal, no murmur, click, rub or gallop Lung:chest clear, no wheezing, rales, normal symmetric air entry.  Abdomin: soft, non-tender, without masses or organomegaly EXT:no erythema, induration, or nodules Skin: Reveals hyperpigmented skin rash that is very faint and seems to be improving.  Lab Results: Lab Results  Component Value Date   WBC 4.9 10/18/2014   HGB 10.9* 10/18/2014   HCT 32.3* 10/18/2014   MCV 101.9* 10/18/2014   PLT 80* 10/18/2014     Chemistry      Component Value Date/Time   NA 140 10/04/2014 0949   NA 141 05/23/2014 1549   K 4.3 10/04/2014 0949   K 4.1 05/23/2014 1549   CL  104 05/23/2014 1549   CO2 25 10/04/2014 0949   CO2 26 05/23/2014 1549   BUN 15.0 10/04/2014 0949   BUN 17 05/23/2014 1549   CREATININE 1.0 10/04/2014 0949   CREATININE 0.78 05/23/2014 1549      Component Value Date/Time   CALCIUM 9.5 10/04/2014 0949   CALCIUM 9.4 05/23/2014 1549   ALKPHOS 123 10/04/2014 0949   ALKPHOS 55 02/15/2014 1121   AST 42* 10/04/2014 0949   AST 24 02/15/2014 1121   ALT 59* 10/04/2014 0949   ALT 21 02/15/2014 1121   BILITOT 0.79 10/04/2014 0949   BILITOT 1.0 02/15/2014 1121       Impression and Plan:   74 year old gentleman with the following issues:  1. 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 He is status post laparoscopic right colectomy done on 04/01/2014. He has T1 N1 disease with 1 out of 12 lymph nodes involved. He is now status post 8 cycles and have tolerated it relatively well. He did have some complications with neutropenia and skin rash. His chemotherapy cycle will be delayed due to weeks related to thrombocytopenia. The plan is to continue chemotherapy on 11/01/2014 with cycle 9. I anticipate close to 10 cycles of chemotherapy with the goal of 12 if he can tolerate it.  2. IV access: Port-A-Cath has been inserted without any complications.  3. Antiemetics: Patient has prescription for Zofran available.  4. Renal cell carcinoma status post resection for a T1 N0 disease. The histology showed papillary type.  5. Thrombocytopenia: She related to chemotherapy without any active bleeding. We will hold chemotherapy for 2 weeks and resume on April the 50,016.  6. Neutropenia prophylaxis: He'll receive Neulasta after each cycle.  7. Follow-up: Will be in 2 weeks for cycle 9.  [facility], MD  3/22/20169:29 AM

## 2014-10-20 ENCOUNTER — Ambulatory Visit: Payer: Medicare Other

## 2014-11-01 ENCOUNTER — Ambulatory Visit (HOSPITAL_BASED_OUTPATIENT_CLINIC_OR_DEPARTMENT_OTHER): Payer: Medicare Other

## 2014-11-01 ENCOUNTER — Telehealth: Payer: Self-pay | Admitting: Oncology

## 2014-11-01 ENCOUNTER — Encounter: Payer: Self-pay | Admitting: Physician Assistant

## 2014-11-01 ENCOUNTER — Other Ambulatory Visit (HOSPITAL_BASED_OUTPATIENT_CLINIC_OR_DEPARTMENT_OTHER): Payer: Medicare Other

## 2014-11-01 ENCOUNTER — Ambulatory Visit (HOSPITAL_BASED_OUTPATIENT_CLINIC_OR_DEPARTMENT_OTHER): Payer: Medicare Other | Admitting: Physician Assistant

## 2014-11-01 VITALS — BP 135/68 | HR 68 | Temp 97.8°F | Resp 18 | Ht 72.0 in | Wt 175.6 lb

## 2014-11-01 DIAGNOSIS — C642 Malignant neoplasm of left kidney, except renal pelvis: Secondary | ICD-10-CM

## 2014-11-01 DIAGNOSIS — Z5111 Encounter for antineoplastic chemotherapy: Secondary | ICD-10-CM | POA: Diagnosis not present

## 2014-11-01 DIAGNOSIS — C189 Malignant neoplasm of colon, unspecified: Secondary | ICD-10-CM

## 2014-11-01 DIAGNOSIS — C180 Malignant neoplasm of cecum: Secondary | ICD-10-CM

## 2014-11-01 LAB — COMPREHENSIVE METABOLIC PANEL (CC13)
ALBUMIN: 3.9 g/dL (ref 3.5–5.0)
ALK PHOS: 91 U/L (ref 40–150)
ALT: 26 U/L (ref 0–55)
AST: 26 U/L (ref 5–34)
Anion Gap: 11 mEq/L (ref 3–11)
BUN: 16.4 mg/dL (ref 7.0–26.0)
CALCIUM: 9.2 mg/dL (ref 8.4–10.4)
CHLORIDE: 105 meq/L (ref 98–109)
CO2: 26 mEq/L (ref 22–29)
Creatinine: 0.9 mg/dL (ref 0.7–1.3)
EGFR: >90 mL/min/{1.73_m2} (ref >90)
Glucose: 109 mg/dl (ref 70–140)
POTASSIUM: 3.5 meq/L (ref 3.5–5.1)
Sodium: 141 mEq/L (ref 136–145)
TOTAL PROTEIN: 7 g/dL (ref 6.4–8.3)
Total Bilirubin: 1.4 mg/dL — ABNORMAL HIGH (ref 0.20–1.20)

## 2014-11-01 LAB — CBC WITH DIFFERENTIAL/PLATELET
BASO%: 0.3 % (ref 0.0–2.0)
BASOS ABS: 0 10*3/uL (ref 0.0–0.1)
EOS%: 1.2 % (ref 0.0–7.0)
Eosinophils Absolute: 0.1 10*3/uL (ref 0.0–0.5)
HCT: 35.1 % — ABNORMAL LOW (ref 38.4–49.9)
HGB: 11.8 g/dL — ABNORMAL LOW (ref 13.0–17.1)
LYMPH%: 15.3 % (ref 14.0–49.0)
MCH: 34.8 pg — AB (ref 27.2–33.4)
MCHC: 33.6 g/dL (ref 32.0–36.0)
MCV: 103.5 fL — AB (ref 79.3–98.0)
MONO#: 0.8 10*3/uL (ref 0.1–0.9)
MONO%: 14.4 % — ABNORMAL HIGH (ref 0.0–14.0)
NEUT#: 4 10*3/uL (ref 1.5–6.5)
NEUT%: 68.8 % (ref 39.0–75.0)
Platelets: 120 10*3/uL — ABNORMAL LOW (ref 140–400)
RBC: 3.39 10*6/uL — ABNORMAL LOW (ref 4.20–5.82)
RDW: 15.6 % — ABNORMAL HIGH (ref 11.0–14.6)
WBC: 5.8 10*3/uL (ref 4.0–10.3)
lymph#: 0.9 10*3/uL (ref 0.9–3.3)

## 2014-11-01 MED ORDER — SODIUM CHLORIDE 0.9 % IV SOLN
Freq: Once | INTRAVENOUS | Status: AC
  Administered 2014-11-01: 10:00:00 via INTRAVENOUS
  Filled 2014-11-01: qty 4

## 2014-11-01 MED ORDER — FLUOROURACIL CHEMO INJECTION 2.5 GM/50ML
400.0000 mg/m2 | Freq: Once | INTRAVENOUS | Status: AC
  Administered 2014-11-01: 800 mg via INTRAVENOUS
  Filled 2014-11-01: qty 16

## 2014-11-01 MED ORDER — OXALIPLATIN CHEMO INJECTION 100 MG/20ML
63.7500 mg/m2 | Freq: Once | INTRAVENOUS | Status: AC
  Administered 2014-11-01: 130 mg via INTRAVENOUS
  Filled 2014-11-01: qty 26

## 2014-11-01 MED ORDER — SODIUM CHLORIDE 0.9 % IV SOLN
2400.0000 mg/m2 | INTRAVENOUS | Status: DC
  Administered 2014-11-01: 4850 mg via INTRAVENOUS
  Filled 2014-11-01: qty 97

## 2014-11-01 MED ORDER — DEXTROSE 5 % IV SOLN
Freq: Once | INTRAVENOUS | Status: AC
  Administered 2014-11-01: 10:00:00 via INTRAVENOUS

## 2014-11-01 MED ORDER — LEUCOVORIN CALCIUM INJECTION 350 MG
394.0000 mg/m2 | Freq: Once | INTRAVENOUS | Status: AC
  Administered 2014-11-01: 800 mg via INTRAVENOUS
  Filled 2014-11-01: qty 40

## 2014-11-01 NOTE — Telephone Encounter (Signed)
printed and gv pt appt and avs..Marland KitchenMarland Kitchen

## 2014-11-01 NOTE — Patient Instructions (Signed)
[facility] Discharge Instructions for Patients Receiving Chemotherapy  Today you received the following chemotherapy agents: Oxaliplatin, Leucovorin, 5FU  To help prevent nausea and vomiting after your treatment, we encourage you to take your nausea medication:Zofran 8 mg every 8 hours as needed.   If you develop nausea and vomiting that is not controlled by your nausea medication, call the clinic.   BELOW ARE SYMPTOMS THAT SHOULD BE REPORTED IMMEDIATELY:  *FEVER GREATER THAN 100.5 F  *CHILLS WITH OR WITHOUT FEVER  NAUSEA AND VOMITING THAT IS NOT CONTROLLED WITH YOUR NAUSEA MEDICATION  *UNUSUAL SHORTNESS OF BREATH  *UNUSUAL BRUISING OR BLEEDING  TENDERNESS IN MOUTH AND THROAT WITH OR WITHOUT PRESENCE OF ULCERS  *URINARY PROBLEMS  *BOWEL PROBLEMS  UNUSUAL RASH Items with * indicate a potential emergency and should be followed up as soon as possible.  Feel free to call the clinic you have any questions or concerns. The clinic phone number is [phone].

## 2014-11-01 NOTE — Patient Instructions (Signed)
Follow-up in 2 weeks

## 2014-11-01 NOTE — Progress Notes (Signed)
Hematology and Oncology Follow Up Visit  Jorge Ross [id number] 05-Oct-1940 74 y.o. 11/01/2014 9:28 AM Jorge Ross, MDClark, Jorge Broach, MD   Principle Diagnosis: 74 year old with colon cancer diagnosed in September 2015. He presented with a cecal mass and found to have a T1 N1 disease. He also has T1 N0 renal cell carcinoma diagnosed at around the same time.   Prior Therapy: He is status post right colectomy done laparoscopically on 04/01/2014. He had a T1 N1 disease was 0 out of 12 lymph nodes involved. He also status post partial nephrectomy on 04/01/2014 for a papillary tumor.  Current therapy: FOLFOX adjuvant chemotherapy cycle 1 to given on 05/31/2014. Status post 8 cycles  Interim History:  Jorge Ross presents today for a follow-up visit unaccompanied. Cycle #9 was held 2 weeks ago secondary to thrombocytopenia. He reports improvement in his by mouth intake over the past 2 weeks. He states that he hit his stoma with a hammer while having any symptoms which is. It is healing well. Overall the skin is dry. He's had some resolution of some erythematous splotches/skin rash that were in his upper torso. He continues to note a loss of taste and general. Since last visit, he is doing relatively well. He is tolerating his chemotherapy with very few complications. He does note some mild tingling and numbness in his fingertips. But he was able to hang up the pictures even though he did hit his thumb with a hammer. He is not reporting any , fatigue, tiredness or change in his performance status. He reports nausea as well as decreased appetite.  He does not report any headaches, blurry vision, syncope. He does not report any chest pain, shortness of breath, cough. He does not report any lower extremity edema or palpitation. He does not report any nausea, vomiting, constipation, diarrhea or hematochezia. He does not report any frequency, urgency or hesitancy. He does not report any skeletal complaints.  Remainder of his review of systems unremarkable.  Medications: I have reviewed the patient's current medications.  Current Outpatient Prescriptions  Medication Sig Dispense Refill  . acetaminophen (TYLENOL) 500 MG tablet Take 500 mg by mouth every 6 (six) hours as needed.    . irbesartan-hydrochlorothiazide (AVALIDE) 150-12.5 MG per tablet Take 1 tablet by mouth every morning.     . lidocaine-prilocaine (EMLA) cream Apply 1 application topically as needed.  0  . loratadine (CLARITIN) 10 MG tablet Take 10 mg by mouth daily.    . Multiple Vitamin (MULTIVITAMIN) capsule Take 1 capsule by mouth daily.      . ondansetron (ZOFRAN) 8 MG tablet Take 1 tablet (8 mg total) by mouth every 8 (eight) hours as needed. 30 tablet 3  . vitamin E 400 UNIT capsule Take 400 Units by mouth daily.     Marland Kitchen aspirin 81 MG tablet Take 81 mg by mouth daily.      . baclofen (LIORESAL) 10 MG tablet   0   No current facility-administered medications for this visit.     Allergies:  Allergies  Allergen Reactions  . Codeine     constipation    Past Medical History, Surgical history, Social history, and Family History were reviewed and updated.   Physical Exam: Blood pressure 135/68, pulse 68, temperature 97.8 F (36.6 C), temperature source Oral, resp. rate 18, height 6' (1.829 m), weight 175 lb 9.6 oz (79.652 kg), SpO2 100 %. ECOG: 0 General appearance: alert and cooperative Head: Normocephalic, without obvious abnormality Neck: no adenopathy Lymph  nodes: Cervical, supraclavicular, and axillary nodes normal. Heart:regular rate and rhythm, S1, S2 normal, no murmur, click, rub or gallop Lung:chest clear, no wheezing, rales, normal symmetric air entry.  Abdomin: soft, non-tender, without masses or organomegaly EXT:no erythema, induration, or nodules Skin: Reveals hyperpigmented skin rash that is very faint and seems significantly improved.  Lab Results: Lab Results  Component Value Date   WBC 5.8 11/01/2014    HGB 11.8* 11/01/2014   HCT 35.1* 11/01/2014   MCV 103.5* 11/01/2014   PLT 120* 11/01/2014     Chemistry      Component Value Date/Time   NA 139 10/18/2014 0855   NA 141 05/23/2014 1549   K 3.5 10/18/2014 0855   K 4.1 05/23/2014 1549   CL 104 05/23/2014 1549   CO2 24 10/18/2014 0855   CO2 26 05/23/2014 1549   BUN 12.3 10/18/2014 0855   BUN 17 05/23/2014 1549   CREATININE 1.1 10/18/2014 0855   CREATININE 0.78 05/23/2014 1549      Component Value Date/Time   CALCIUM 9.2 10/18/2014 0855   CALCIUM 9.4 05/23/2014 1549   ALKPHOS 116 10/18/2014 0855   ALKPHOS 55 02/15/2014 1121   AST 63* 10/18/2014 0855   AST 24 02/15/2014 1121   ALT 108* 10/18/2014 0855   ALT 21 02/15/2014 1121   BILITOT 0.95 10/18/2014 0855   BILITOT 1.0 02/15/2014 1121       Impression and Plan:   74 year old gentleman with the following issues:  1. 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 He is status post laparoscopic right colectomy done on 04/01/2014. He has T1 N1 disease with 1 out of 12 lymph nodes involved. He is now status post 8 cycles and have tolerated it relatively well. He did have some complications with neutropenia and skin rash. His chemotherapy cycle will be delayed due to weeks related to thrombocytopenia. The plan is to continue chemotherapy on 11/01/2014 with cycle 9. I anticipate close to 10 cycles of chemotherapy with the goal of 12 if he can tolerate it.  2. IV access: Port-A-Cath has been inserted without any complications.  3. Antiemetics: Patient has prescription for Zofran available.  4. Renal cell carcinoma status post resection for a T1 N0 disease. The histology showed papillary type.  5. Thrombocytopenia: Resolved. He will proceed with cycle #9 today  6. Neutropenia prophylaxis: He'll receive Neulasta after each cycle.  7. Follow-up: Will be in 2 weeks for cycle 10.  Awilda Metro E, PA-C  4/5/20169:28 AM

## 2014-11-03 ENCOUNTER — Ambulatory Visit: Payer: Medicare Other

## 2014-11-03 ENCOUNTER — Ambulatory Visit (HOSPITAL_BASED_OUTPATIENT_CLINIC_OR_DEPARTMENT_OTHER): Payer: Medicare Other

## 2014-11-03 DIAGNOSIS — Z452 Encounter for adjustment and management of vascular access device: Secondary | ICD-10-CM | POA: Diagnosis not present

## 2014-11-03 DIAGNOSIS — C189 Malignant neoplasm of colon, unspecified: Secondary | ICD-10-CM

## 2014-11-03 DIAGNOSIS — Z5189 Encounter for other specified aftercare: Secondary | ICD-10-CM

## 2014-11-03 DIAGNOSIS — C180 Malignant neoplasm of cecum: Secondary | ICD-10-CM | POA: Diagnosis not present

## 2014-11-03 MED ORDER — HEPARIN SOD (PORK) LOCK FLUSH 100 UNIT/ML IV SOLN
500.0000 [IU] | Freq: Once | INTRAVENOUS | Status: AC | PRN
  Administered 2014-11-03: 500 [IU]
  Filled 2014-11-03: qty 5

## 2014-11-03 MED ORDER — PEGFILGRASTIM INJECTION 6 MG/0.6ML ~~LOC~~
6.0000 mg | PREFILLED_SYRINGE | Freq: Once | SUBCUTANEOUS | Status: AC
  Administered 2014-11-03: 6 mg via SUBCUTANEOUS
  Filled 2014-11-03: qty 0.6

## 2014-11-03 MED ORDER — SODIUM CHLORIDE 0.9 % IJ SOLN
10.0000 mL | INTRAMUSCULAR | Status: DC | PRN
  Administered 2014-11-03: 10 mL
  Filled 2014-11-03: qty 10

## 2014-11-03 NOTE — Patient Instructions (Signed)
Pegfilgrastim injection What is this medicine? PEGFILGRASTIM (peg fil GRA stim) is a long-acting granulocyte colony-stimulating factor that stimulates the growth of neutrophils, a type of white blood cell important in the body's fight against infection. It is used to reduce the incidence of fever and infection in patients with certain types of cancer who are receiving chemotherapy that affects the bone marrow. This medicine may be used for other purposes; ask your health care provider or pharmacist if you have questions. COMMON BRAND NAME(S): Neulasta What should I tell my health care provider before I take this medicine? They need to know if you have any of these conditions: -latex allergy -ongoing radiation therapy -sickle cell disease -skin reactions to acrylic adhesives (On-Body Injector only) -an unusual or allergic reaction to pegfilgrastim, filgrastim, other medicines, foods, dyes, or preservatives -pregnant or trying to get pregnant -breast-feeding How should I use this medicine? This medicine is for injection under the skin. If you get this medicine at home, you will be taught how to prepare and give the pre-filled syringe or how to use the On-body Injector. Refer to the patient Instructions for Use for detailed instructions. Use exactly as directed. Take your medicine at regular intervals. Do not take your medicine more often than directed. It is important that you put your used needles and syringes in a special sharps container. Do not put them in a trash can. If you do not have a sharps container, call your pharmacist or healthcare provider to get one. Talk to your pediatrician regarding the use of this medicine in children. Special care may be needed. Overdosage: If you think you have taken too much of this medicine contact a poison control center or emergency room at once. NOTE: This medicine is only for you. Do not share this medicine with others. What if I miss a dose? It is  important not to miss your dose. Call your doctor or health care professional if you miss your dose. If you miss a dose due to an On-body Injector failure or leakage, a new dose should be administered as soon as possible using a single prefilled syringe for manual use. What may interact with this medicine? Interactions have not been studied. Give your health care provider a list of all the medicines, herbs, non-prescription drugs, or dietary supplements you use. Also tell them if you smoke, drink alcohol, or use illegal drugs. Some items may interact with your medicine. This list may not describe all possible interactions. Give your health care provider a list of all the medicines, herbs, non-prescription drugs, or dietary supplements you use. Also tell them if you smoke, drink alcohol, or use illegal drugs. Some items may interact with your medicine. What should I watch for while using this medicine? You may need blood work done while you are taking this medicine. If you are going to need a MRI, CT scan, or other procedure, tell your doctor that you are using this medicine (On-Body Injector only). What side effects may I notice from receiving this medicine? Side effects that you should report to your doctor or health care professional as soon as possible: -allergic reactions like skin rash, itching or hives, swelling of the face, lips, or tongue -dizziness -fever -pain, redness, or irritation at site where injected -pinpoint red spots on the skin -shortness of breath or breathing problems -stomach or side pain, or pain at the shoulder -swelling -tiredness -trouble passing urine Side effects that usually do not require medical attention (report to your doctor   or health care professional if they continue or are bothersome): -bone pain -muscle pain This list may not describe all possible side effects. Call your doctor for medical advice about side effects. You may report side effects to FDA at  1-800-FDA-1088. Where should I keep my medicine? Keep out of the reach of children. Store pre-filled syringes in a refrigerator between 2 and 8 degrees C (36 and 46 degrees F). Do not freeze. Keep in carton to protect from light. Throw away this medicine if it is left out of the refrigerator for more than 48 hours. Throw away any unused medicine after the expiration date. NOTE: This sheet is a summary. It may not cover all possible information. If you have questions about this medicine, talk to your doctor, pharmacist, or health care provider.  2015, Elsevier/Gold Standard. (2013-10-14 16:14:05) Fluorouracil, 5FU; Diclofenac topical cream What is this medicine? FLUOROURACIL; DICLOFENAC (flure oh YOOR a sil; dye KLOE fen ak) is a combination of a topical chemotherapy agent and non-steroidal anti-inflammatory drug (NSAID). It is used on the skin to treat skin cancer and skin conditions that could become cancer. This medicine may be used for other purposes; ask your health care provider or pharmacist if you have questions. COMMON BRAND NAME(S): FLUORAC What should I tell my health care provider before I take this medicine? They need to know if you have any of these conditions: -bleeding problems -cigarette smoker -DPD enzyme deficiency -heart disease -high blood pressure -if you frequently drink alcohol containing drinks -kidney disease -liver disease -open or infected skin -stomach problems -swelling or open sores at the treatment site -recent or planned coronary artery bypass graft (CABG) surgery -an unusual or allergic reaction to fluorouracil, diclofenac, aspirin, other NSAIDs, other medicines, foods, dyes, or preservatives -pregnant or trying to get pregnant -breast-feeding How should I use this medicine? This medicine is only for use on the skin. Follow the directions on the prescription label. Wash hands before and after use. Wash affected area and gently pat dry. To apply this  medicine use a cotton-tipped applicator, or use gloves if applying with fingertips. If applied with unprotected fingertips, it is very important to wash your hands well after you apply this medicine. Avoid applying to the eyes, nose, or mouth. Apply enough medicine to cover the affected area. You can cover the area with a light gauze dressing, but do not use tight or air-tight dressings. Finish the full course prescribed by your doctor or health care professional, even if you think your condition is better. Do not stop taking except on the advice of your doctor or health care professional. Talk to your pediatrician regarding the use of this medicine in children. Special care may be needed. Overdosage: If you think you've taken too much of this medicine contact a poison control center or emergency room at once. Overdosage: If you think you have taken too much of this medicine contact a poison control center or emergency room at once. NOTE: This medicine is only for you. Do not share this medicine with others. What if I miss a dose? If you miss a dose, apply it as soon as you can. If it is almost time for your next dose, only use that dose. Do not apply extra doses. Contact your doctor or health care professional if you miss more than one dose. What may interact with this medicine? Interactions are not expected. Do not use any other skin products without telling your doctor or health care professional. This list   may not describe all possible interactions. Give your health care provider a list of all the medicines, herbs, non-prescription drugs, or dietary supplements you use. Also tell them if you smoke, drink alcohol, or use illegal drugs. Some items may interact with your medicine. What should I watch for while using this medicine? Visit your doctor or health care professional for checks on your progress. You will need to use this medicine for 2 to 6 weeks. This may be longer depending on the condition  being treated. You may not see full healing for another 1 to 2 months after you stop using the medicine. Treated areas of skin can look unsightly during and for several weeks after treatment with this medicine. This medicine can make you more sensitive to the sun. Keep out of the sun. If you cannot avoid being in the sun, wear protective clothing and use sunscreen. Do not use sun lamps or tanning beds/booths. Where should I keep my What side effects may I notice from receiving this medicine? Side effects that you should report to your doctor or health care professional as soon as possible: -allergic reactions like skin rash, itching or hives, swelling of the face, lips, or tongue -black or bloody stools, blood in the urine or vomit -blurred vision -chest pain -difficulty breathing or wheezing -redness, blistering, peeling or loosening of the skin, including inside the mouth -severe redness and swelling of normal skin -slurred speech or weakness on one side of the body -trouble passing urine or change in the amount of urine -unexplained weight gain or swelling -unusually weak or tired -yellowing of eyes or skin Side effects that usually do not require medical attention (Report these to your doctor or health care professional if they continue or are bothersome.): -increased sensitivity of the skin to sun and ultraviolet light -pain and burning of the affected area -scaling or swelling of the affected area -skin rash, itching of the affected area -tenderness This list may not describe all possible side effects. Call your doctor for medical advice about side effects. You may report side effects to FDA at 1-800-FDA-1088. Where should I keep my medicine? Keep out of the reach of children. Store at room temperature between 20 and 25 degrees C (68 and 77 degrees F). Throw away any unused medicine after the expiration date. NOTE: This sheet is a summary. It may not cover all possible information.  If you have questions about this medicine, talk to your doctor, pharmacist, or health care provider.  2015, [employer]vier/Gold Standard. (2013-11-15 11:09:58)  

## 2014-11-03 NOTE — Progress Notes (Signed)
Neulasta injection given by flush nurse 

## 2014-11-15 ENCOUNTER — Other Ambulatory Visit: Payer: Self-pay | Admitting: *Deleted

## 2014-11-15 ENCOUNTER — Other Ambulatory Visit (HOSPITAL_BASED_OUTPATIENT_CLINIC_OR_DEPARTMENT_OTHER): Payer: Medicare Other

## 2014-11-15 ENCOUNTER — Telehealth: Payer: Self-pay | Admitting: Oncology

## 2014-11-15 ENCOUNTER — Ambulatory Visit (HOSPITAL_BASED_OUTPATIENT_CLINIC_OR_DEPARTMENT_OTHER): Payer: Medicare Other

## 2014-11-15 ENCOUNTER — Ambulatory Visit (HOSPITAL_BASED_OUTPATIENT_CLINIC_OR_DEPARTMENT_OTHER): Payer: Medicare Other | Admitting: Oncology

## 2014-11-15 VITALS — BP 127/50 | HR 68 | Temp 97.6°F | Resp 18 | Ht 72.0 in | Wt 176.9 lb

## 2014-11-15 DIAGNOSIS — C189 Malignant neoplasm of colon, unspecified: Secondary | ICD-10-CM

## 2014-11-15 DIAGNOSIS — C642 Malignant neoplasm of left kidney, except renal pelvis: Secondary | ICD-10-CM

## 2014-11-15 DIAGNOSIS — Z5111 Encounter for antineoplastic chemotherapy: Secondary | ICD-10-CM | POA: Diagnosis not present

## 2014-11-15 DIAGNOSIS — N2889 Other specified disorders of kidney and ureter: Secondary | ICD-10-CM

## 2014-11-15 DIAGNOSIS — C779 Secondary and unspecified malignant neoplasm of lymph node, unspecified: Secondary | ICD-10-CM | POA: Diagnosis not present

## 2014-11-15 DIAGNOSIS — C180 Malignant neoplasm of cecum: Secondary | ICD-10-CM

## 2014-11-15 DIAGNOSIS — D126 Benign neoplasm of colon, unspecified: Secondary | ICD-10-CM

## 2014-11-15 DIAGNOSIS — D696 Thrombocytopenia, unspecified: Secondary | ICD-10-CM

## 2014-11-15 LAB — COMPREHENSIVE METABOLIC PANEL (CC13)
ALT: 25 U/L (ref 0–55)
AST: 30 U/L (ref 5–34)
Albumin: 4 g/dL (ref 3.5–5.0)
Alkaline Phosphatase: 115 U/L (ref 40–150)
Anion Gap: 15 mEq/L — ABNORMAL HIGH (ref 3–11)
BUN: 15.1 mg/dL (ref 7.0–26.0)
CO2: 22 meq/L (ref 22–29)
Calcium: 9.2 mg/dL (ref 8.4–10.4)
Chloride: 104 mEq/L (ref 98–109)
Creatinine: 0.9 mg/dL (ref 0.7–1.3)
EGFR: >90 mL/min/{1.73_m2} (ref >90)
Glucose: 94 mg/dl (ref 70–140)
POTASSIUM: 3.4 meq/L — AB (ref 3.5–5.1)
SODIUM: 140 meq/L (ref 136–145)
Total Bilirubin: 0.77 mg/dL (ref 0.20–1.20)
Total Protein: 6.9 g/dL (ref 6.4–8.3)

## 2014-11-15 LAB — CBC WITH DIFFERENTIAL/PLATELET
BASO%: 0.2 % (ref 0.0–2.0)
Basophils Absolute: 0 10*3/uL (ref 0.0–0.1)
EOS%: 1.6 % (ref 0.0–7.0)
Eosinophils Absolute: 0.1 10*3/uL (ref 0.0–0.5)
HCT: 35 % — ABNORMAL LOW (ref 38.4–49.9)
HGB: 12 g/dL — ABNORMAL LOW (ref 13.0–17.1)
LYMPH#: 1.2 10*3/uL (ref 0.9–3.3)
LYMPH%: 21 % (ref 14.0–49.0)
MCH: 35.4 pg — ABNORMAL HIGH (ref 27.2–33.4)
MCHC: 34.3 g/dL (ref 32.0–36.0)
MCV: 103.2 fL — AB (ref 79.3–98.0)
MONO#: 0.7 10*3/uL (ref 0.1–0.9)
MONO%: 11.4 % (ref 0.0–14.0)
NEUT#: 3.8 10*3/uL (ref 1.5–6.5)
NEUT%: 65.8 % (ref 39.0–75.0)
PLATELETS: 106 10*3/uL — AB (ref 140–400)
RBC: 3.39 10*6/uL — ABNORMAL LOW (ref 4.20–5.82)
RDW: 14.4 % (ref 11.0–14.6)
WBC: 5.7 10*3/uL (ref 4.0–10.3)

## 2014-11-15 MED ORDER — OXALIPLATIN CHEMO INJECTION 100 MG/20ML
63.7500 mg/m2 | Freq: Once | INTRAVENOUS | Status: AC
  Administered 2014-11-15: 130 mg via INTRAVENOUS
  Filled 2014-11-15: qty 26

## 2014-11-15 MED ORDER — SODIUM CHLORIDE 0.9 % IV SOLN
Freq: Once | INTRAVENOUS | Status: AC
  Administered 2014-11-15: 10:00:00 via INTRAVENOUS

## 2014-11-15 MED ORDER — SODIUM CHLORIDE 0.9 % IV SOLN
2400.0000 mg/m2 | INTRAVENOUS | Status: DC
  Administered 2014-11-15: 4850 mg via INTRAVENOUS
  Filled 2014-11-15: qty 97

## 2014-11-15 MED ORDER — ONDANSETRON HCL 8 MG PO TABS: 8.0000 mg | ORAL_TABLET | Freq: Three times a day (TID) | ORAL | Status: DC | PRN

## 2014-11-15 MED ORDER — SODIUM CHLORIDE 0.9 % IV SOLN
Freq: Once | INTRAVENOUS | Status: AC
  Administered 2014-11-15: 10:00:00 via INTRAVENOUS
  Filled 2014-11-15: qty 4

## 2014-11-15 MED ORDER — LEUCOVORIN CALCIUM INJECTION 350 MG
394.0000 mg/m2 | Freq: Once | INTRAVENOUS | Status: AC
  Administered 2014-11-15: 800 mg via INTRAVENOUS
  Filled 2014-11-15: qty 40

## 2014-11-15 MED ORDER — DEXTROSE 5 % IV SOLN
Freq: Once | INTRAVENOUS | Status: AC
  Administered 2014-11-15: 10:00:00 via INTRAVENOUS

## 2014-11-15 MED ORDER — FLUOROURACIL CHEMO INJECTION 2.5 GM/50ML
400.0000 mg/m2 | Freq: Once | INTRAVENOUS | Status: AC
  Administered 2014-11-15: 800 mg via INTRAVENOUS
  Filled 2014-11-15: qty 16

## 2014-11-15 NOTE — Progress Notes (Signed)
Hematology and Oncology Follow Up Visit  Jorge Ross [id number] Dec 19, 1940 74 y.o. 11/15/2014 9:19 AM [name], MDClark, Don Broach, MD   Principle Diagnosis: 74 year old with colon cancer diagnosed in September 2015. He presented with a cecal mass and found to have a T1 N1 disease. He also has T1 N0 renal cell carcinoma diagnosed at around the same time.   Prior Therapy: He is status post right colectomy done laparoscopically on 04/01/2014. He had a T1 N1 disease was 0 out of 12 lymph nodes involved. He also status post partial nephrectomy on 04/01/2014 for a papillary tumor.  Current therapy: FOLFOX adjuvant chemotherapy cycle 1 to given on 05/31/2014. Status post 9 cycles  Interim History:  Mr. Malecha presents today for a follow-up visit with his wife. Since the last visit, he reports no issues related to chemotherapy. He reported slight decrease in his appetite but since it's been improving. He also noted some arthralgias related to Neulasta. His skin rash has also improved at this time. He does not report any petechiae or bleeding. He does note some mild tingling and numbness in his fingertips. He is not reporting any  fatigue, tiredness or change in his performance status. He reports no nausea or vomiting.  He does not report any headaches, blurry vision, syncope. He does not report any chest pain, shortness of breath, cough. He does not report any lower extremity edema or palpitation. He does not report any nausea, vomiting, constipation, diarrhea or hematochezia. He does not report any frequency, urgency or hesitancy. He does not report any skeletal complaints. Remainder of his review of systems unremarkable.  Medications: I have reviewed the patient's current medications.  Current Outpatient Prescriptions  Medication Sig Dispense Refill  . acetaminophen (TYLENOL) 500 MG tablet Take 500 mg by mouth every 6 (six) hours as needed.    . baclofen (LIORESAL) 10 MG tablet   0  .  irbesartan-hydrochlorothiazide (AVALIDE) 150-12.5 MG per tablet Take 1 tablet by mouth every morning.     . lidocaine-prilocaine (EMLA) cream Apply 1 application topically as needed.  0  . loratadine (CLARITIN) 10 MG tablet Take 10 mg by mouth daily.    . Multiple Vitamin (MULTIVITAMIN) capsule Take 1 capsule by mouth daily.      . ondansetron (ZOFRAN) 8 MG tablet Take 1 tablet (8 mg total) by mouth every 8 (eight) hours as needed. 30 tablet 3  . vitamin E 400 UNIT capsule Take 400 Units by mouth daily.     Marland Kitchen aspirin 81 MG tablet Take 81 mg by mouth daily.       No current facility-administered medications for this visit.     Allergies:  Allergies  Allergen Reactions  . Codeine Other (See Comments)    constipation    Past Medical History, Surgical history, Social history, and Family History were reviewed and updated.   Physical Exam: Blood pressure 127/50, pulse 68, temperature 97.6 F (36.4 C), temperature source Oral, resp. rate 18, height 6' (1.829 m), weight 176 lb 14.4 oz (80.241 kg), SpO2 98 %. ECOG: 0 General appearance: alert and cooperative did not appear in any distress. Head: Normocephalic, without obvious abnormality Neck: no adenopathy Lymph nodes: Cervical, supraclavicular, and axillary nodes normal. Heart:regular rate and rhythm, S1, S2 normal, no murmur, click, rub or gallop Lung:chest clear, no wheezing, rales, normal symmetric air entry.  Abdomin: soft, non-tender, without masses or organomegaly EXT:no erythema, induration, or nodules Skin: Reveals hyperpigmented skin rash that is very faint and seems  significantly improved.  Lab Results: Lab Results  Component Value Date   WBC 5.7 11/15/2014   HGB 12.0* 11/15/2014   HCT 35.0* 11/15/2014   MCV 103.2* 11/15/2014   PLT 106* 11/15/2014     Chemistry      Component Value Date/Time   NA 140 11/15/2014 0846   NA 141 05/23/2014 1549   K 3.4* 11/15/2014 0846   K 4.1 05/23/2014 1549   CL 104 05/23/2014  1549   CO2 22 11/15/2014 0846   CO2 26 05/23/2014 1549   BUN 15.1 11/15/2014 0846   BUN 17 05/23/2014 1549   CREATININE 0.9 11/15/2014 0846   CREATININE 0.78 05/23/2014 1549      Component Value Date/Time   CALCIUM 9.2 11/15/2014 0846   CALCIUM 9.4 05/23/2014 1549   ALKPHOS 115 11/15/2014 0846   ALKPHOS 55 02/15/2014 1121   AST 30 11/15/2014 0846   AST 24 02/15/2014 1121   ALT 25 11/15/2014 0846   ALT 21 02/15/2014 1121   BILITOT 0.77 11/15/2014 0846   BILITOT 1.0 02/15/2014 1121       Impression and Plan:   74 year old gentleman with the following issues:  1. 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 He is status post laparoscopic right colectomy done on 04/01/2014. He has T1 N1 disease with 1 out of 12 lymph nodes involved. He is now status post 9 cycles of chemotherapy that has been well tolerated. The plan is to proceed with cycle 10 without any dose reduction or delay. I have tentatively scheduled him for cycle 11 in 2 weeks and I am not planning to proceed with any further chemotherapy beyond that. I also given the option of not receiving cycle 11 if he is not up to it. I think completing 10 cycles of chemotherapy should be adequate at this time. I am also scheduling him a staging CT scan to done on 12/27/2014.  2. IV access: Port-A-Cath has been inserted without any complications. This will be flushed periodically after chemotherapy.  3. Antiemetics: Patient has prescription for Zofran available.  4. Renal cell carcinoma status post resection for a T1 N0 disease. The histology showed papillary type.  5. Thrombocytopenia: His platelet count is adequate today to proceed with chemotherapy.  6. Neutropenia prophylaxis: He'll receive Neulasta after each cycle.  7. Follow-up: Will be in 2 weeks for cycle 11 if he is up to receiving any further chemotherapy at this time.  [facility], MD 4/19/20169:19 AM

## 2014-11-15 NOTE — Telephone Encounter (Signed)
gave and printed appt sched and avs for April May and June

## 2014-11-15 NOTE — Patient Instructions (Signed)
[facility] Discharge Instructions for Patients Receiving Chemotherapy  Today you received the following chemotherapy agents: Oxaliplatin, Leucovorin, Adrucil/5FU push/pump  To help prevent nausea and vomiting after your treatment, we encourage you to take your nausea medication as prescribed by your physician.   If you develop nausea and vomiting that is not controlled by your nausea medication, call the clinic.   BELOW ARE SYMPTOMS THAT SHOULD BE REPORTED IMMEDIATELY:  *FEVER GREATER THAN 100.5 F  *CHILLS WITH OR WITHOUT FEVER  NAUSEA AND VOMITING THAT IS NOT CONTROLLED WITH YOUR NAUSEA MEDICATION  *UNUSUAL SHORTNESS OF BREATH  *UNUSUAL BRUISING OR BLEEDING  TENDERNESS IN MOUTH AND THROAT WITH OR WITHOUT PRESENCE OF ULCERS  *URINARY PROBLEMS  *BOWEL PROBLEMS  UNUSUAL RASH Items with * indicate a potential emergency and should be followed up as soon as possible.  Feel free to call the clinic you have any questions or concerns. The clinic phone number is [phone].  Please show the [facility] at check-in to the Emergency Department and triage nurse.

## 2014-11-16 ENCOUNTER — Other Ambulatory Visit: Payer: Self-pay | Admitting: Oncology

## 2014-11-17 ENCOUNTER — Ambulatory Visit: Payer: Medicare Other

## 2014-11-17 ENCOUNTER — Ambulatory Visit (HOSPITAL_BASED_OUTPATIENT_CLINIC_OR_DEPARTMENT_OTHER): Payer: Medicare Other

## 2014-11-17 VITALS — BP 124/42 | HR 55 | Temp 98.0°F

## 2014-11-17 DIAGNOSIS — C180 Malignant neoplasm of cecum: Secondary | ICD-10-CM

## 2014-11-17 DIAGNOSIS — Z5189 Encounter for other specified aftercare: Secondary | ICD-10-CM | POA: Diagnosis not present

## 2014-11-17 DIAGNOSIS — C189 Malignant neoplasm of colon, unspecified: Secondary | ICD-10-CM

## 2014-11-17 DIAGNOSIS — Z452 Encounter for adjustment and management of vascular access device: Secondary | ICD-10-CM | POA: Diagnosis not present

## 2014-11-17 MED ORDER — HEPARIN SOD (PORK) LOCK FLUSH 100 UNIT/ML IV SOLN
500.0000 [IU] | Freq: Once | INTRAVENOUS | Status: AC | PRN
  Administered 2014-11-17: 500 [IU]
  Filled 2014-11-17: qty 5

## 2014-11-17 MED ORDER — SODIUM CHLORIDE 0.9 % IJ SOLN
10.0000 mL | INTRAMUSCULAR | Status: DC | PRN
  Administered 2014-11-17: 10 mL
  Filled 2014-11-17: qty 10

## 2014-11-17 MED ORDER — PEGFILGRASTIM INJECTION 6 MG/0.6ML ~~LOC~~
6.0000 mg | PREFILLED_SYRINGE | Freq: Once | SUBCUTANEOUS | Status: AC
  Administered 2014-11-17: 6 mg via SUBCUTANEOUS
  Filled 2014-11-17: qty 0.6

## 2014-11-17 NOTE — Patient Instructions (Signed)
Fluorouracil, 5FU; Diclofenac topical cream What is this medicine? FLUOROURACIL; DICLOFENAC (flure oh YOOR a sil; dye KLOE fen ak) is a combination of a topical chemotherapy agent and non-steroidal anti-inflammatory drug (NSAID). It is used on the skin to treat skin cancer and skin conditions that could become cancer. This medicine may be used for other purposes; ask your health care provider or pharmacist if you have questions. COMMON BRAND NAME(S): FLUORAC What should I tell my health care provider before I take this medicine? They need to know if you have any of these conditions: -bleeding problems -cigarette smoker -DPD enzyme deficiency -heart disease -high blood pressure -if you frequently drink alcohol containing drinks -kidney disease -liver disease -open or infected skin -stomach problems -swelling or open sores at the treatment site -recent or planned coronary artery bypass graft (CABG) surgery -an unusual or allergic reaction to fluorouracil, diclofenac, aspirin, other NSAIDs, other medicines, foods, dyes, or preservatives -pregnant or trying to get pregnant -breast-feeding How should I use this medicine? This medicine is only for use on the skin. Follow the directions on the prescription label. Wash hands before and after use. Wash affected area and gently pat dry. To apply this medicine use a cotton-tipped applicator, or use gloves if applying with fingertips. If applied with unprotected fingertips, it is very important to wash your hands well after you apply this medicine. Avoid applying to the eyes, nose, or mouth. Apply enough medicine to cover the affected area. You can cover the area with a light gauze dressing, but do not use tight or air-tight dressings. Finish the full course prescribed by your doctor or health care professional, even if you think your condition is better. Do not stop taking except on the advice of your doctor or health care professional. Talk to your  pediatrician regarding the use of this medicine in children. Special care may be needed. Overdosage: If you think you've taken too much of this medicine contact a poison control center or emergency room at once. Overdosage: If you think you have taken too much of this medicine contact a poison control center or emergency room at once. NOTE: This medicine is only for you. Do not share this medicine with others. What if I miss a dose? If you miss a dose, apply it as soon as you can. If it is almost time for your next dose, only use that dose. Do not apply extra doses. Contact your doctor or health care professional if you miss more than one dose. What may interact with this medicine? Interactions are not expected. Do not use any other skin products without telling your doctor or health care professional. This list may not describe all possible interactions. Give your health care provider a list of all the medicines, herbs, non-prescription drugs, or dietary supplements you use. Also tell them if you smoke, drink alcohol, or use illegal drugs. Some items may interact with your medicine. What should I watch for while using this medicine? Visit your doctor or health care professional for checks on your progress. You will need to use this medicine for 2 to 6 weeks. This may be longer depending on the condition being treated. You may not see full healing for another 1 to 2 months after you stop using the medicine. Treated areas of skin can look unsightly during and for several weeks after treatment with this medicine. This medicine can make you more sensitive to the sun. Keep out of the sun. If you cannot avoid being in   the sun, wear protective clothing and use sunscreen. Do not use sun lamps or tanning beds/booths. Where should I keep my What side effects may I notice from receiving this medicine? Side effects that you should report to your doctor or health care professional as soon as possible: -allergic  reactions like skin rash, itching or hives, swelling of the face, lips, or tongue -black or bloody stools, blood in the urine or vomit -blurred vision -chest pain -difficulty breathing or wheezing -redness, blistering, peeling or loosening of the skin, including inside the mouth -severe redness and swelling of normal skin -slurred speech or weakness on one side of the body -trouble passing urine or change in the amount of urine -unexplained weight gain or swelling -unusually weak or tired -yellowing of eyes or skin Side effects that usually do not require medical attention (Report these to your doctor or health care professional if they continue or are bothersome.): -increased sensitivity of the skin to sun and ultraviolet light -pain and burning of the affected area -scaling or swelling of the affected area -skin rash, itching of the affected area -tenderness This list may not describe all possible side effects. Call your doctor for medical advice about side effects. You may report side effects to FDA at 1-800-FDA-1088. Where should I keep my medicine? Keep out of the reach of children. Store at room temperature between 20 and 25 degrees C (68 and 77 degrees F). Throw away any unused medicine after the expiration date. NOTE: This sheet is a summary. It may not cover all possible information. If you have questions about this medicine, talk to your doctor, pharmacist, or health care provider.  2015, Elsevier/Gold Standard. (2013-11-15 11:09:58) Pegfilgrastim injection What is this medicine? PEGFILGRASTIM (peg fil GRA stim) is a long-acting granulocyte colony-stimulating factor that stimulates the growth of neutrophils, a type of white blood cell important in the body's fight against infection. It is used to reduce the incidence of fever and infection in patients with certain types of cancer who are receiving chemotherapy that affects the bone marrow. This medicine may be used for other  purposes; ask your health care provider or pharmacist if you have questions. COMMON BRAND NAME(S): Neulasta What should I tell my health care provider before I take this medicine? They need to know if you have any of these conditions: -latex allergy -ongoing radiation therapy -sickle cell disease -skin reactions to acrylic adhesives (On-Body Injector only) -an unusual or allergic reaction to pegfilgrastim, filgrastim, other medicines, foods, dyes, or preservatives -pregnant or trying to get pregnant -breast-feeding How should I use this medicine? This medicine is for injection under the skin. If you get this medicine at home, you will be taught how to prepare and give the pre-filled syringe or how to use the On-body Injector. Refer to the patient Instructions for Use for detailed instructions. Use exactly as directed. Take your medicine at regular intervals. Do not take your medicine more often than directed. It is important that you put your used needles and syringes in a special sharps container. Do not put them in a trash can. If you do not have a sharps container, call your pharmacist or healthcare provider to get one. Talk to your pediatrician regarding the use of this medicine in children. Special care may be needed. Overdosage: If you think you have taken too much of this medicine contact a poison control center or emergency room at once. NOTE: This medicine is only for you. Do not share this medicine with  others. What if I miss a dose? It is important not to miss your dose. Call your doctor or health care professional if you miss your dose. If you miss a dose due to an On-body Injector failure or leakage, a new dose should be administered as soon as possible using a single prefilled syringe for manual use. What may interact with this medicine? Interactions have not been studied. Give your health care provider a list of all the medicines, herbs, non-prescription drugs, or dietary  supplements you use. Also tell them if you smoke, drink alcohol, or use illegal drugs. Some items may interact with your medicine. This list may not describe all possible interactions. Give your health care provider a list of all the medicines, herbs, non-prescription drugs, or dietary supplements you use. Also tell them if you smoke, drink alcohol, or use illegal drugs. Some items may interact with your medicine. What should I watch for while using this medicine? You may need blood work done while you are taking this medicine. If you are going to need a MRI, CT scan, or other procedure, tell your doctor that you are using this medicine (On-Body Injector only). What side effects may I notice from receiving this medicine? Side effects that you should report to your doctor or health care professional as soon as possible: -allergic reactions like skin rash, itching or hives, swelling of the face, lips, or tongue -dizziness -fever -pain, redness, or irritation at site where injected -pinpoint red spots on the skin -shortness of breath or breathing problems -stomach or side pain, or pain at the shoulder -swelling -tiredness -trouble passing urine Side effects that usually do not require medical attention (report to your doctor or health care professional if they continue or are bothersome): -bone pain -muscle pain This list may not describe all possible side effects. Call your doctor for medical advice about side effects. You may report side effects to FDA at 1-800-FDA-1088. Where should I keep my medicine? Keep out of the reach of children. Store pre-filled syringes in a refrigerator between 2 and 8 degrees C (36 and 46 degrees F). Do not freeze. Keep in carton to protect from light. Throw away this medicine if it is left out of the refrigerator for more than 48 hours. Throw away any unused medicine after the expiration date. NOTE: This sheet is a summary. It may not cover all possible  information. If you have questions about this medicine, talk to your doctor, pharmacist, or health care provider.  2015, Elsevier/Gold Standard. (2013-10-14 16:14:05)

## 2014-11-17 NOTE — Progress Notes (Signed)
Neulasta injection given by flush nurse 

## 2014-11-28 ENCOUNTER — Ambulatory Visit (HOSPITAL_BASED_OUTPATIENT_CLINIC_OR_DEPARTMENT_OTHER): Payer: Medicare Other | Admitting: Nurse Practitioner

## 2014-11-28 ENCOUNTER — Other Ambulatory Visit (HOSPITAL_BASED_OUTPATIENT_CLINIC_OR_DEPARTMENT_OTHER): Payer: Medicare Other

## 2014-11-28 VITALS — BP 129/67 | HR 50 | Temp 97.5°F | Resp 18 | Ht 72.0 in | Wt 173.6 lb

## 2014-11-28 DIAGNOSIS — C649 Malignant neoplasm of unspecified kidney, except renal pelvis: Secondary | ICD-10-CM | POA: Diagnosis not present

## 2014-11-28 DIAGNOSIS — C180 Malignant neoplasm of cecum: Secondary | ICD-10-CM | POA: Diagnosis not present

## 2014-11-28 DIAGNOSIS — C189 Malignant neoplasm of colon, unspecified: Secondary | ICD-10-CM

## 2014-11-28 DIAGNOSIS — D6959 Other secondary thrombocytopenia: Secondary | ICD-10-CM

## 2014-11-28 LAB — CBC WITH DIFFERENTIAL/PLATELET
BASO%: 0.3 % (ref 0.0–2.0)
BASOS ABS: 0 10*3/uL (ref 0.0–0.1)
EOS%: 0.2 % (ref 0.0–7.0)
Eosinophils Absolute: 0 10*3/uL (ref 0.0–0.5)
HCT: 33.6 % — ABNORMAL LOW (ref 38.4–49.9)
HEMOGLOBIN: 11 g/dL — AB (ref 13.0–17.1)
LYMPH%: 18.2 % (ref 14.0–49.0)
MCH: 34.1 pg — AB (ref 27.2–33.4)
MCHC: 32.5 g/dL (ref 32.0–36.0)
MCV: 104.6 fL — AB (ref 79.3–98.0)
MONO#: 0.9 10*3/uL (ref 0.1–0.9)
MONO%: 12 % (ref 0.0–14.0)
NEUT#: 5.4 10*3/uL (ref 1.5–6.5)
NEUT%: 69.3 % (ref 39.0–75.0)
Platelets: 86 10*3/uL — ABNORMAL LOW (ref 140–400)
RBC: 3.22 10*6/uL — AB (ref 4.20–5.82)
RDW: 14.3 % (ref 11.0–14.6)
WBC: 7.8 10*3/uL (ref 4.0–10.3)
lymph#: 1.4 10*3/uL (ref 0.9–3.3)

## 2014-11-28 LAB — COMPREHENSIVE METABOLIC PANEL (CC13)
ALT: 32 U/L (ref 0–55)
AST: 30 U/L (ref 5–34)
Albumin: 3.9 g/dL (ref 3.5–5.0)
Alkaline Phosphatase: 123 U/L (ref 40–150)
Anion Gap: 10 mEq/L (ref 3–11)
BUN: 10.2 mg/dL (ref 7.0–26.0)
CHLORIDE: 104 meq/L (ref 98–109)
CO2: 28 mEq/L (ref 22–29)
CREATININE: 0.9 mg/dL (ref 0.7–1.3)
Calcium: 9.3 mg/dL (ref 8.4–10.4)
Glucose: 96 mg/dl (ref 70–140)
Potassium: 4.1 mEq/L (ref 3.5–5.1)
Sodium: 142 mEq/L (ref 136–145)
Total Bilirubin: 0.59 mg/dL (ref 0.20–1.20)
Total Protein: 6.9 g/dL (ref 6.4–8.3)

## 2014-11-28 NOTE — Progress Notes (Signed)
  [facility] OFFICE PROGRESS NOTE   Principle Diagnosis: Colon cancer diagnosed in September 2015. He presented with a cecal mass and found to have a T1 N1 disease. He also has T1 N0 renal cell carcinoma diagnosed at around the same time.   Prior Therapy: He is status post right colectomy done laparoscopically on 04/01/2014. He had a T1 N1 disease was 0 out of 12 lymph nodes involved. He also status post partial nephrectomy on 04/01/2014 for a papillary tumor.  Current therapy: FOLFOX adjuvant chemotherapy cycle 1 given on 05/31/2014. Status post 10 cycles.    INTERVAL HISTORY:   Mr. Iannone returns as scheduled. He completed cycle 10 FOLFOX 11/15/2014. He had mild nausea. No vomiting. No mouth sores. No diarrhea. He has mild numbness in the fingertips. The numbness does not interfere with activity. He notes an alteration in taste. He had bone pain following Neulasta. He noted good relief with ibuprofen.  Objective:  Vital signs in last 24 hours:  Blood pressure 129/67, pulse 50, temperature 97.5 F (36.4 C), temperature source Oral, resp. rate 18, height 6' (1.829 m), weight 173 lb 9.6 oz (78.744 kg), SpO2 100 %.    HEENT: No thrush or ulcers. Resp: Lungs clear bilaterally. Cardio: Regular rate and rhythm. GI: Abdomen soft and nontender. No hepatomegaly. Vascular: No leg edema. Neuro: Vibratory sense mildly to moderately decreased over the fingertips per tuning fork exam.  Skin: Hyperpigmented skin rash upper back and chest. Port-A-Cath without erythema.   Lab Results:  Lab Results  Component Value Date   WBC 7.8 11/28/2014   HGB 11.0* 11/28/2014   HCT 33.6* 11/28/2014   MCV 104.6* 11/28/2014   PLT 86* 11/28/2014   NEUTROABS 5.4 11/28/2014    Imaging:  No results found.  Medications: I have reviewed the patient's current medications.  Assessment/Plan: 1. 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 Status post laparoscopic right  colectomy 04/01/2014; T1 N1 disease with 1 out of 12 lymph nodes involved. Adjuvant FOLFOX chemotherapy initiated 05/31/2014. 2. Renal cell carcinoma status post resection 04/01/2014 for T1 N0 disease; histology showed papillary type. 3. Port-A-Cath placement 05/24/2014. 4. Joint pain following Neulasta. 5. Hyperpigmented skin rash likely related to 5-fluorouracil. 6. 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   Disposition: Mr. Blyden appears stable. He has completed 10 cycles of FOLFOX. He is scheduled for cycle 11 11/15/2014. He has progressive thrombocytopenia with a current platelet count of 86,000. I will discuss the thrombocytopenia with Dr. Alen Blew upon his return to the office tomorrow and we will let Mr. Belisle know Dr. Haz[facility] Junker recommendation. Mr. Pellicane understands to contact the office with any bleeding.    Ned Card ANP/GNP-BC   11/28/2014  12:57 PM

## 2014-11-29 ENCOUNTER — Other Ambulatory Visit: Payer: Self-pay | Admitting: Nurse Practitioner

## 2014-11-29 ENCOUNTER — Ambulatory Visit (HOSPITAL_BASED_OUTPATIENT_CLINIC_OR_DEPARTMENT_OTHER): Payer: Medicare Other

## 2014-11-29 DIAGNOSIS — C180 Malignant neoplasm of cecum: Secondary | ICD-10-CM

## 2014-11-29 DIAGNOSIS — Z5111 Encounter for antineoplastic chemotherapy: Secondary | ICD-10-CM | POA: Diagnosis not present

## 2014-11-29 DIAGNOSIS — C189 Malignant neoplasm of colon, unspecified: Secondary | ICD-10-CM

## 2014-11-29 MED ORDER — SODIUM CHLORIDE 0.9 % IJ SOLN
10.0000 mL | INTRAMUSCULAR | Status: DC | PRN
  Filled 2014-11-29: qty 10

## 2014-11-29 MED ORDER — HEPARIN SOD (PORK) LOCK FLUSH 100 UNIT/ML IV SOLN
500.0000 [IU] | Freq: Once | INTRAVENOUS | Status: DC | PRN
  Filled 2014-11-29: qty 5

## 2014-11-29 MED ORDER — DEXTROSE 5 % IV SOLN
Freq: Once | INTRAVENOUS | Status: AC
  Administered 2014-11-29: 12:00:00 via INTRAVENOUS

## 2014-11-29 MED ORDER — FLUOROURACIL CHEMO INJECTION 2.5 GM/50ML
400.0000 mg/m2 | Freq: Once | INTRAVENOUS | Status: AC
Start: 2014-11-29 — End: 2014-11-29
  Administered 2014-11-29: 800 mg via INTRAVENOUS
  Filled 2014-11-29: qty 16

## 2014-11-29 MED ORDER — SODIUM CHLORIDE 0.9 % IV SOLN
2400.0000 mg/m2 | INTRAVENOUS | Status: DC
  Administered 2014-11-29: 4850 mg via INTRAVENOUS
  Filled 2014-11-29: qty 97

## 2014-11-29 MED ORDER — LEUCOVORIN CALCIUM INJECTION 350 MG
400.0000 mg/m2 | Freq: Once | INTRAVENOUS | Status: AC
  Administered 2014-11-29: 812 mg via INTRAVENOUS
  Filled 2014-11-29: qty 40.6

## 2014-11-29 NOTE — Patient Instructions (Signed)
Leucovorin injection What is this medicine? LEUCOVORIN (loo koe VOR in) is used to prevent or treat the harmful effects of some medicines. This medicine is used to treat anemia caused by a low amount of folic acid in the body. It is also used with 5-fluorouracil (5-FU) to treat colon cancer. This medicine may be used for other purposes; ask your health care provider or pharmacist if you have questions. What should I tell my health care provider before I take this medicine? They need to know if you have any of these conditions: -anemia from low levels of vitamin B-12 in the blood -an unusual or allergic reaction to leucovorin, folic acid, other medicines, foods, dyes, or preservatives -pregnant or trying to get pregnant -breast-feeding How should I use this medicine? This medicine is for injection into a muscle or into a vein. It is given by a health care professional in a hospital or clinic setting. Talk to your pediatrician regarding the use of this medicine in children. Special care may be needed. Overdosage: If you think you have taken too much of this medicine contact a poison control center or emergency room at once. NOTE: This medicine is only for you. Do not share this medicine with others. What if I miss a dose? This does not apply. What may interact with this medicine? -capecitabine -fluorouracil -phenobarbital -phenytoin -primidone -trimethoprim-sulfamethoxazole This list may not describe all possible interactions. Give your health care provider a list of all the medicines, herbs, non-prescription drugs, or dietary supplements you use. Also tell them if you smoke, drink alcohol, or use illegal drugs. Some items may interact with your medicine. What should I watch for while using this medicine? Your condition will be monitored carefully while you are receiving this medicine. This medicine may increase the side effects of 5-fluorouracil, 5-FU. Tell your doctor or health care  professional if you have diarrhea or mouth sores that do not get better or that get worse. What side effects may I notice from receiving this medicine? Side effects that you should report to your doctor or health care professional as soon as possible: -allergic reactions like skin rash, itching or hives, swelling of the face, lips, or tongue -breathing problems -fever, infection -mouth sores -unusual bleeding or bruising -unusually weak or tired Side effects that usually do not require medical attention (report to your doctor or health care professional if they continue or are bothersome): -constipation or diarrhea -loss of appetite -nausea, vomiting This list may not describe all possible side effects. Call your doctor for medical advice about side effects. You may report side effects to FDA at 1-800-FDA-1088. Where should I keep my medicine? This drug is given in a hospital or clinic and will not be stored at home. NOTE: This sheet is a summary. It may not cover all possible information. If you have questions about this medicine, talk to your doctor, pharmacist, or health care provider.  2015, Elsevier/Gold Standard. (2008-01-19 16:50:29) Fluorouracil, 5-FU injection What is this medicine? FLUOROURACIL, 5-FU (flure oh YOOR a sil) is a chemotherapy drug. It slows the growth of cancer cells. This medicine is used to treat many types of cancer like breast cancer, colon or rectal cancer, pancreatic cancer, and stomach cancer. This medicine may be used for other purposes; ask your health care provider or pharmacist if you have questions. COMMON BRAND NAME(S): Adrucil What should I tell my health care provider before I take this medicine? They need to know if you have any of these conditions: -  blood disorders -dihydropyrimidine dehydrogenase (DPD) deficiency -infection (especially a virus infection such as chickenpox, cold sores, or herpes) -kidney disease -liver disease -malnourished,  poor nutrition -recent or ongoing radiation therapy -an unusual or allergic reaction to fluorouracil, other chemotherapy, other medicines, foods, dyes, or preservatives -pregnant or trying to get pregnant -breast-feeding How should I use this medicine? This drug is given as an infusion or injection into a vein. It is administered in a hospital or clinic by a specially trained health care professional. Talk to your pediatrician regarding the use of this medicine in children. Special care may be needed. Overdosage: If you think you have taken too much of this medicine contact a poison control center or emergency room at once. NOTE: This medicine is only for you. Do not share this medicine with others. What if I miss a dose? It is important not to miss your dose. Call your doctor or health care professional if you are unable to keep an appointment. What may interact with this medicine? -allopurinol -cimetidine -dapsone -digoxin -hydroxyurea -leucovorin -levamisole -medicines for seizures like ethotoin, fosphenytoin, phenytoin -medicines to increase blood counts like filgrastim, pegfilgrastim, sargramostim -medicines that treat or prevent blood clots like warfarin, enoxaparin, and dalteparin -methotrexate -metronidazole -pyrimethamine -some other chemotherapy drugs like busulfan, cisplatin, estramustine, vinblastine -trimethoprim -trimetrexate -vaccines Talk to your doctor or health care professional before taking any of these medicines: -acetaminophen -aspirin -ibuprofen -ketoprofen -naproxen This list may not describe all possible interactions. Give your health care provider a list of all the medicines, herbs, non-prescription drugs, or dietary supplements you use. Also tell them if you smoke, drink alcohol, or use illegal drugs. Some items may interact with your medicine. What should I watch for while using this medicine? Visit your doctor for checks on your progress. This drug  may make you feel generally unwell. This is not uncommon, as chemotherapy can affect healthy cells as well as cancer cells. Report any side effects. Continue your course of treatment even though you feel ill unless your doctor tells you to stop. In some cases, you may be given additional medicines to help with side effects. Follow all directions for their use. Call your doctor or health care professional for advice if you get a fever, chills or sore throat, or other symptoms of a cold or flu. Do not treat yourself. This drug decreases your body's ability to fight infections. Try to avoid being around people who are sick. This medicine may increase your risk to bruise or bleed. Call your doctor or health care professional if you notice any unusual bleeding. Be careful brushing and flossing your teeth or using a toothpick because you may get an infection or bleed more easily. If you have any dental work done, tell your dentist you are receiving this medicine. Avoid taking products that contain aspirin, acetaminophen, ibuprofen, naproxen, or ketoprofen unless instructed by your doctor. These medicines may hide a fever. Do not become pregnant while taking this medicine. Women should inform their doctor if they wish to become pregnant or think they might be pregnant. There is a potential for serious side effects to an unborn child. Talk to your health care professional or pharmacist for more information. Do not breast-feed an infant while taking this medicine. Men should inform their doctor if they wish to father a child. This medicine may lower sperm counts. Do not treat diarrhea with over the counter products. Contact your doctor if you have diarrhea that lasts more than 2 days or if it  is severe and watery. This medicine can make you more sensitive to the sun. Keep out of the sun. If you cannot avoid being in the sun, wear protective clothing and use sunscreen. Do not use sun lamps or tanning  beds/booths. What side effects may I notice from receiving this medicine? Side effects that you should report to your doctor or health care professional as soon as possible: -allergic reactions like skin rash, itching or hives, swelling of the face, lips, or tongue -low blood counts - this medicine may decrease the number of white blood cells, red blood cells and platelets. You may be at increased risk for infections and bleeding. -signs of infection - fever or chills, cough, sore throat, pain or difficulty passing urine -signs of decreased platelets or bleeding - bruising, pinpoint red spots on the skin, black, tarry stools, blood in the urine -signs of decreased red blood cells - unusually weak or tired, fainting spells, lightheadedness -breathing problems -changes in vision -chest pain -mouth sores -nausea and vomiting -pain, swelling, redness at site where injected -pain, tingling, numbness in the hands or feet -redness, swelling, or sores on hands or feet -stomach pain -unusual bleeding Side effects that usually do not require medical attention (report to your doctor or health care professional if they continue or are bothersome): -changes in finger or toe nails -diarrhea -dry or itchy skin -hair loss -headache -loss of appetite -sensitivity of eyes to the light -stomach upset -unusually teary eyes This list may not describe all possible side effects. Call your doctor for medical advice about side effects. You may report side effects to FDA at 1-800-FDA-1088. Where should I keep my medicine? This drug is given in a hospital or clinic and will not be stored at home. NOTE: This sheet is a summary. It may not cover all possible information. If you have questions about this medicine, talk to your doctor, pharmacist, or health care provider.  2015, Elsevier/Gold Standard. (2007-11-18 13:53:16)

## 2014-12-01 ENCOUNTER — Ambulatory Visit (HOSPITAL_BASED_OUTPATIENT_CLINIC_OR_DEPARTMENT_OTHER): Payer: Medicare Other

## 2014-12-01 ENCOUNTER — Ambulatory Visit: Payer: Medicare Other

## 2014-12-01 VITALS — BP 118/64 | HR 68 | Temp 98.8°F | Resp 18

## 2014-12-01 DIAGNOSIS — Z452 Encounter for adjustment and management of vascular access device: Secondary | ICD-10-CM

## 2014-12-01 DIAGNOSIS — C180 Malignant neoplasm of cecum: Secondary | ICD-10-CM | POA: Diagnosis not present

## 2014-12-01 DIAGNOSIS — C189 Malignant neoplasm of colon, unspecified: Secondary | ICD-10-CM

## 2014-12-01 MED ORDER — HEPARIN SOD (PORK) LOCK FLUSH 100 UNIT/ML IV SOLN
500.0000 [IU] | Freq: Once | INTRAVENOUS | Status: AC | PRN
  Administered 2014-12-01: 500 [IU]
  Filled 2014-12-01: qty 5

## 2014-12-01 MED ORDER — SODIUM CHLORIDE 0.9 % IJ SOLN
10.0000 mL | INTRAMUSCULAR | Status: DC | PRN
Start: 2014-12-01 — End: 2014-12-01
  Administered 2014-12-01: 10 mL
  Filled 2014-12-01: qty 10

## 2014-12-22 ENCOUNTER — Other Ambulatory Visit (HOSPITAL_BASED_OUTPATIENT_CLINIC_OR_DEPARTMENT_OTHER): Payer: Medicare Other

## 2014-12-22 ENCOUNTER — Telehealth: Payer: Self-pay | Admitting: *Deleted

## 2014-12-22 ENCOUNTER — Ambulatory Visit (HOSPITAL_BASED_OUTPATIENT_CLINIC_OR_DEPARTMENT_OTHER): Payer: Medicare Other

## 2014-12-22 ENCOUNTER — Ambulatory Visit (HOSPITAL_COMMUNITY)
Admission: RE | Admit: 2014-12-22 | Discharge: 2014-12-22 | Disposition: A | Discharge status: Home / Self Care | Payer: Medicare Other | Source: Ambulatory Visit | Attending: Oncology | Admitting: Oncology

## 2014-12-22 ENCOUNTER — Encounter (HOSPITAL_COMMUNITY): Payer: Self-pay

## 2014-12-22 VITALS — BP 136/73 | HR 51 | Temp 98.5°F

## 2014-12-22 DIAGNOSIS — Z85528 Personal history of other malignant neoplasm of kidney: Secondary | ICD-10-CM | POA: Insufficient documentation

## 2014-12-22 DIAGNOSIS — C189 Malignant neoplasm of colon, unspecified: Secondary | ICD-10-CM | POA: Diagnosis not present

## 2014-12-22 DIAGNOSIS — C649 Malignant neoplasm of unspecified kidney, except renal pelvis: Secondary | ICD-10-CM | POA: Diagnosis not present

## 2014-12-22 DIAGNOSIS — Z452 Encounter for adjustment and management of vascular access device: Secondary | ICD-10-CM | POA: Diagnosis not present

## 2014-12-22 DIAGNOSIS — C180 Malignant neoplasm of cecum: Secondary | ICD-10-CM

## 2014-12-22 DIAGNOSIS — Z95828 Presence of other vascular implants and grafts: Secondary | ICD-10-CM

## 2014-12-22 DIAGNOSIS — D6959 Other secondary thrombocytopenia: Secondary | ICD-10-CM | POA: Diagnosis not present

## 2014-12-22 LAB — CBC WITH DIFFERENTIAL/PLATELET
BASO%: 0.4 % (ref 0.0–2.0)
Basophils Absolute: 0 10*3/uL (ref 0.0–0.1)
EOS%: 1.1 % (ref 0.0–7.0)
Eosinophils Absolute: 0 10*3/uL (ref 0.0–0.5)
HEMATOCRIT: 33.3 % — AB (ref 38.4–49.9)
HEMOGLOBIN: 11.4 g/dL — AB (ref 13.0–17.1)
LYMPH%: 40.1 % (ref 14.0–49.0)
MCH: 35.1 pg — AB (ref 27.2–33.4)
MCHC: 34.2 g/dL (ref 32.0–36.0)
MCV: 102.5 fL — AB (ref 79.3–98.0)
MONO#: 0.5 10*3/uL (ref 0.1–0.9)
MONO%: 18.7 % — ABNORMAL HIGH (ref 0.0–14.0)
NEUT#: 1.1 10*3/uL — ABNORMAL LOW (ref 1.5–6.5)
NEUT%: 39.7 % (ref 39.0–75.0)
Platelets: 117 10*3/uL — ABNORMAL LOW (ref 140–400)
RBC: 3.25 10*6/uL — ABNORMAL LOW (ref 4.20–5.82)
RDW: 13.5 % (ref 11.0–14.6)
WBC: 2.7 10*3/uL — AB (ref 4.0–10.3)
lymph#: 1.1 10*3/uL (ref 0.9–3.3)

## 2014-12-22 LAB — COMPREHENSIVE METABOLIC PANEL (CC13)
ALT: 14 U/L (ref 0–55)
AST: 23 U/L (ref 5–34)
Albumin: 3.9 g/dL (ref 3.5–5.0)
Alkaline Phosphatase: 78 U/L (ref 40–150)
Anion Gap: 10 mEq/L (ref 3–11)
BILIRUBIN TOTAL: 1.11 mg/dL (ref 0.20–1.20)
BUN: 15.5 mg/dL (ref 7.0–26.0)
CALCIUM: 9 mg/dL (ref 8.4–10.4)
CO2: 25 meq/L (ref 22–29)
CREATININE: 0.9 mg/dL (ref 0.7–1.3)
Chloride: 106 mEq/L (ref 98–109)
EGFR: >90 mL/min/{1.73_m2} (ref >90)
GLUCOSE: 91 mg/dL (ref 70–140)
Potassium: 4.1 mEq/L (ref 3.5–5.1)
SODIUM: 140 meq/L (ref 136–145)
Total Protein: 6.9 g/dL (ref 6.4–8.3)

## 2014-12-22 MED ORDER — IOHEXOL 300 MG/ML  SOLN
100.0000 mL | Freq: Once | INTRAMUSCULAR | Status: AC | PRN
  Administered 2014-12-22: 100 mL via INTRAVENOUS

## 2014-12-22 MED ORDER — HEPARIN SOD (PORK) LOCK FLUSH 100 UNIT/ML IV SOLN
500.0000 [IU] | Freq: Once | INTRAVENOUS | Status: AC
  Administered 2014-12-22: 500 [IU] via INTRAVENOUS
  Filled 2014-12-22: qty 5

## 2014-12-22 MED ORDER — SODIUM CHLORIDE 0.9 % IJ SOLN
10.0000 mL | INTRAMUSCULAR | Status: DC | PRN
  Administered 2014-12-22: 10 mL via INTRAVENOUS
  Filled 2014-12-22: qty 10

## 2014-12-22 NOTE — Progress Notes (Signed)
Patient in for [facility] access and labs today. Patient's PAC accessed and flushed without difficulty. Blood return noted with flush and labs obtained. Patient left accessed for CT appointment today, and PAC covered with a Tegaderm dressing. Instructed Patient to remind CT Tech to remove PAC needle after CT is complete or to return to the [facility] to have needle removed. Patient verbalized understanding.

## 2014-12-22 NOTE — Patient Instructions (Signed)

## 2014-12-22 NOTE — Telephone Encounter (Signed)
"  I'm scheduled for lab and CT today.  Can the results be given to me before I leave?  I receive chemotherapy and need to know if my blood counts are low."  Informed him if CBC done results can be given but the STAT CMET takes an hour to result.  CT department perhaps may share these results.  Reports feeling sluggish and concerned about cbc and platelets.  Last chemotherapy was 11-29-2014 through 12-01-2014.  Should have mature cells at this time.  Denies trouble eating, drinking and rests well.

## 2014-12-27 ENCOUNTER — Other Ambulatory Visit: Payer: Medicare Other

## 2014-12-28 ENCOUNTER — Ambulatory Visit (HOSPITAL_BASED_OUTPATIENT_CLINIC_OR_DEPARTMENT_OTHER): Payer: Medicare Other | Admitting: Oncology

## 2014-12-28 ENCOUNTER — Telehealth: Payer: Self-pay | Admitting: Oncology

## 2014-12-28 VITALS — BP 118/50 | HR 61 | Temp 97.7°F | Resp 20 | Ht 72.0 in | Wt 173.6 lb

## 2014-12-28 DIAGNOSIS — C180 Malignant neoplasm of cecum: Secondary | ICD-10-CM | POA: Diagnosis not present

## 2014-12-28 DIAGNOSIS — C649 Malignant neoplasm of unspecified kidney, except renal pelvis: Secondary | ICD-10-CM | POA: Diagnosis not present

## 2014-12-28 DIAGNOSIS — D696 Thrombocytopenia, unspecified: Secondary | ICD-10-CM

## 2014-12-28 DIAGNOSIS — C189 Malignant neoplasm of colon, unspecified: Secondary | ICD-10-CM

## 2014-12-28 NOTE — Progress Notes (Signed)
Hematology and Oncology Follow Up Visit  Jorge Ross [id number] 1941-06-25 74 y.o. 12/28/2014 10:24 AM Jorge Ross, MDClark, Don Broach, MD   Principle Diagnosis: 74 year old with colon cancer diagnosed in September 2015. He presented with a cecal mass and found to have a T1 N1 disease. He also has T1 N0 renal cell carcinoma diagnosed at around the same time.   Prior Therapy: He is status post right colectomy done laparoscopically on 04/01/2014. He had a T1 N1 disease was 0 out of 12 lymph nodes involved. He also status post partial nephrectomy on 04/01/2014 for a papillary tumor. FOLFOX adjuvant chemotherapy cycle 1 to given on 05/31/2014. Status post 11 cycles completed on 11/28/2014.  Current therapy: Observation and surveillance.  Interim History:  Jorge Ross presents today for a follow-up visit with his wife. Since the last visit, he reports no issues related to chemotherapy. He is recovering quite nicely without any issues. He does not report any petechiae or bleeding. He does report peripheral neuropathy that is improving gradually. His activity level has improved and is close to normal. He does note some mild tingling and numbness in his fingertips. He is not reporting any  fatigue, tiredness or change in his performance status. He reports no nausea or vomiting.  He does not report any headaches, blurry vision, syncope. He does not report any chest pain, shortness of breath, cough. He does not report any lower extremity edema or palpitation. He does not report any nausea, vomiting, constipation, diarrhea or hematochezia. He does not report any frequency, urgency or hesitancy. He does not report any skeletal complaints. Remainder of his review of systems unremarkable.  Medications: I have reviewed the patient's current medications.  Current Outpatient Prescriptions  Medication Sig Dispense Refill  . acetaminophen (TYLENOL) 500 MG tablet Take 500 mg by mouth every 6 (six) hours as  needed.    Marland Kitchen aspirin 81 MG tablet Take 81 mg by mouth daily.      . baclofen (LIORESAL) 10 MG tablet   0  . irbesartan-hydrochlorothiazide (AVALIDE) 150-12.5 MG per tablet Take 1 tablet by mouth every morning.     . lidocaine-prilocaine (EMLA) cream Apply 1 application topically as needed.  0  . loratadine (CLARITIN) 10 MG tablet Take 10 mg by mouth daily.    . Multiple Vitamin (MULTIVITAMIN) capsule Take 1 capsule by mouth daily.      . ondansetron (ZOFRAN) 8 MG tablet Take 1 tablet (8 mg total) by mouth every 8 (eight) hours as needed. 30 tablet 3  . vitamin E 400 UNIT capsule Take 400 Units by mouth daily.      No current facility-administered medications for this visit.     Allergies:  Allergies  Allergen Reactions  . Codeine Other (See Comments)    constipation    Past Medical History, Surgical history, Social history, and Family History were reviewed and updated.   Physical Exam: Blood pressure 118/50, pulse 61, temperature 97.7 F (36.5 C), temperature source Oral, resp. rate 20, height 6' (1.829 m), weight 173 lb 9.6 oz (78.744 kg), SpO2 99 %. ECOG: 0 General appearance: alert and cooperative did not appear in any distress. Head: Normocephalic, without obvious abnormality Neck: no adenopathy Lymph nodes: Cervical, supraclavicular, and axillary nodes normal. Heart:regular rate and rhythm, S1, S2 normal, no murmur, click, rub or gallop Lung:chest clear, no wheezing, rales, normal symmetric air entry.  Abdomin: soft, non-tender, without masses or organomegaly EXT:no erythema, induration, or nodules Skin: Reveals hyperpigmented skin rash that is  very faint and seems significantly improved.  Lab Results: Lab Results  Component Value Date   WBC 2.7* 12/22/2014   HGB 11.4* 12/22/2014   HCT 33.3* 12/22/2014   MCV 102.5* 12/22/2014   PLT 117* 12/22/2014     Chemistry      Component Value Date/Time   NA 140 12/22/2014 1011   NA 141 05/23/2014 1549   K 4.1 12/22/2014  1011   K 4.1 05/23/2014 1549   CL 104 05/23/2014 1549   CO2 25 12/22/2014 1011   CO2 26 05/23/2014 1549   BUN 15.5 12/22/2014 1011   BUN 17 05/23/2014 1549   CREATININE 0.9 12/22/2014 1011   CREATININE 0.78 05/23/2014 1549      Component Value Date/Time   CALCIUM 9.0 12/22/2014 1011   CALCIUM 9.4 05/23/2014 1549   ALKPHOS 78 12/22/2014 1011   ALKPHOS 55 02/15/2014 1121   AST 23 12/22/2014 1011   AST 24 02/15/2014 1121   ALT 14 12/22/2014 1011   ALT 21 02/15/2014 1121   BILITOT 1.11 12/22/2014 1011   BILITOT 1.0 02/15/2014 1121     EXAM: CT CHEST, ABDOMEN, AND PELVIS WITH CONTRAST  TECHNIQUE: Multidetector CT imaging of the chest, abdomen and pelvis was performed following the standard protocol during bolus administration of intravenous contrast.  CONTRAST: 135mL OMNIPAQUE IOHEXOL 300 MG/ML SOLN  COMPARISON: 08/05/2014 CT abdomen, CT CAP 02/17/2014  FINDINGS: CT CHEST FINDINGS  Mediastinum/Nodes: Left-sided Port-A-Cath in place with tip in the distal SVC. Moderate atheromatous aortic calcification without aneurysm. Heart size normal. No pericardial effusion. No lymphadenopathy.  Lungs/Pleura: The lungs are clear. No pleural effusion. Central airways are patent.  Upper abdomen: Please see detailed report below.  Musculoskeletal: No acute osseous abnormality or lytic or sclerotic osseous lesion.  CT ABDOMEN AND PELVIS FINDINGS  Lower chest: Lung bases are clear.  Hepatobiliary: Liver and gallbladder appear unremarkable.  Pancreas: Normal  Spleen: Normal  Adrenals/Urinary Tract: Mobile fullness to the bilateral adrenal glands without measurable mass or significant interval change. Stable 3 mm too small to characterize right upper renal pole cortical hypodense lesion image 60 series 2, likely a cyst, and similar-appearing 3 mm hypodense lesion right mid kidney image 70 series 2. No hydroureteronephrosis. Mid left renal cortical  wedge resection deformity image 68 series 2 measuring internal fluid density is stable, with punctate probable adjacent dystrophic calcification image 68. Adjacent renal cortical cyst is stable. No radiopaque renal or ureteral calculus. Apart from thin cortical mid left renal enhancement as seen previously, no abnormal internal enhancement is identified.  Stomach/Bowel: Moderate stool burden. Evidence of right hemicolectomy. No mass lesion at the resection margin.  Vascular/Lymphatic: No aortic aneurysm. No lymphadenopathy. Stable representative 5 mm retroperitoneal periaortic node image 70.  Other: No free air or fluid.  Musculoskeletal: Mild lower lumbar spine disc degenerative change. No acute osseous abnormality. Cervical fusion hardware partly visualized.  IMPRESSION: No evidence for metastatic disease or recurrence within the chest, abdomen, or pelvis.    Impression and Plan:   74 year old gentleman with the following issues:  1. 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 He is status post laparoscopic right colectomy done on 04/01/2014. He has T1 N1 disease with 1 out of 12 lymph nodes involved.   He is now status post 11 cycles of adjuvant FOLFOX chemotherapy that was well-tolerated without any major complications. CT scan on 12/22/2014 was reviewed today and did not show any evidence of relapse disease. The plan is to continue with  active surveillance and repeat imaging studies in 6 months.  Clinical visit in 3 months.  2. IV access: Port-A-Cath has been inserted without any complications. This will be flushed periodically and will be removed if his neck CT scan in 6 months is clear.  3. Antiemetics: Patient has prescription for Zofran available.  4. Renal cell carcinoma status post resection for a T1 N0 disease. The histology showed papillary type.  5. Thrombocytopenia: His platelet count is adequate and we'll continue to recover.  6.  Neutropenia prophylaxis: This will not be needed moving forward.  7. Follow-up: In 3 months for a clinical visit.  [facility], MD 6/1/201610:24 AM

## 2014-12-28 NOTE — Telephone Encounter (Signed)
Gave and printed appt sched and avs for pt for July Aug and Sept °

## 2015-02-08 ENCOUNTER — Ambulatory Visit (HOSPITAL_BASED_OUTPATIENT_CLINIC_OR_DEPARTMENT_OTHER): Payer: Medicare Other

## 2015-02-08 VITALS — BP 116/49 | HR 60 | Temp 97.7°F | Resp 18

## 2015-02-08 DIAGNOSIS — Z452 Encounter for adjustment and management of vascular access device: Secondary | ICD-10-CM

## 2015-02-08 DIAGNOSIS — Z95828 Presence of other vascular implants and grafts: Secondary | ICD-10-CM

## 2015-02-08 DIAGNOSIS — C180 Malignant neoplasm of cecum: Secondary | ICD-10-CM

## 2015-02-08 MED ORDER — SODIUM CHLORIDE 0.9 % IJ SOLN
10.0000 mL | INTRAMUSCULAR | Status: DC | PRN
  Administered 2015-02-08: 10 mL via INTRAVENOUS
  Filled 2015-02-08: qty 10

## 2015-02-08 MED ORDER — HEPARIN SOD (PORK) LOCK FLUSH 100 UNIT/ML IV SOLN
500.0000 [IU] | Freq: Once | INTRAVENOUS | Status: AC
  Administered 2015-02-08: 500 [IU] via INTRAVENOUS
  Filled 2015-02-08: qty 5

## 2015-02-08 NOTE — Patient Instructions (Signed)

## 2015-02-10 ENCOUNTER — Telehealth: Payer: Self-pay | Admitting: *Deleted

## 2015-02-10 NOTE — Telephone Encounter (Signed)
Patient calling to c/o numbness and tingling in his feet and the tips of his fingers. It is interfering with his sleep. Also nails are soft and chipping. Left ankle has some swelling. Denies fever,red streaks or pain while ambulating. Instructed to keep elevated while sitting. Note to dr Hazeline Junker desk.

## 2015-02-15 ENCOUNTER — Other Ambulatory Visit: Payer: Self-pay | Admitting: *Deleted

## 2015-02-15 DIAGNOSIS — C189 Malignant neoplasm of colon, unspecified: Secondary | ICD-10-CM

## 2015-02-15 MED ORDER — GABAPENTIN 300 MG PO CAPS: 300.0000 mg | ORAL_CAPSULE | Freq: Every day | ORAL | Status: DC

## 2015-02-15 NOTE — Telephone Encounter (Signed)
This RN spoke with wife regarding side effects of chemotherapy. Instructed her to tell patient to stop biting and chewing on fingernails. Wife agreed. Per Dr. Alen Blew, I will e-scribe a prescription for Neurontin to help with the pain and numbness in hands/feet. Wife verbalized understanding of medication. Instructed them to call back with any other questions or concerns.

## 2015-02-15 NOTE — Telephone Encounter (Signed)
Second call per patient about numbness to hands and feet.  Last Chemotherapy 11-29-2014.  "Eversince I stopped chemotherapy, the tips of my fingers are numb.  I have to grip really tight or things slip.  My nails are soft and chipping.  My toenails look like they will come off and are numb too.  What can I do to alleviate this?  My sister had chemo and her doctor told her to use a cream called Arnicare.  I have pain especially at night.  My feet are swollen since I stopped chemo, the left more than the right.  The skin has stopped peeling and now is puffy and tight.  I soak in alcohol to try to relieve this at night." Denies calluses, trouble walking, redness, blistering, admits to pain at night, tightness, swelling of hands and feet.  Will notify Dr. Alen Blew.  Return number [phone].

## 2015-03-22 ENCOUNTER — Ambulatory Visit (HOSPITAL_BASED_OUTPATIENT_CLINIC_OR_DEPARTMENT_OTHER): Payer: Medicare Other

## 2015-03-22 VITALS — BP 100/88 | HR 59 | Temp 97.8°F | Resp 19

## 2015-03-22 DIAGNOSIS — Z452 Encounter for adjustment and management of vascular access device: Secondary | ICD-10-CM | POA: Diagnosis not present

## 2015-03-22 DIAGNOSIS — Z95828 Presence of other vascular implants and grafts: Secondary | ICD-10-CM

## 2015-03-22 DIAGNOSIS — C180 Malignant neoplasm of cecum: Secondary | ICD-10-CM | POA: Diagnosis not present

## 2015-03-22 MED ORDER — SODIUM CHLORIDE 0.9 % IJ SOLN
10.0000 mL | INTRAMUSCULAR | Status: DC | PRN
  Administered 2015-03-22: 10 mL via INTRAVENOUS
  Filled 2015-03-22: qty 10

## 2015-03-22 MED ORDER — HEPARIN SOD (PORK) LOCK FLUSH 100 UNIT/ML IV SOLN
500.0000 [IU] | Freq: Once | INTRAVENOUS | Status: AC
  Administered 2015-03-22: 500 [IU] via INTRAVENOUS
  Filled 2015-03-22: qty 5

## 2015-03-22 NOTE — Patient Instructions (Signed)

## 2015-05-03 ENCOUNTER — Ambulatory Visit (HOSPITAL_BASED_OUTPATIENT_CLINIC_OR_DEPARTMENT_OTHER): Payer: Medicare Other

## 2015-05-03 ENCOUNTER — Ambulatory Visit (HOSPITAL_BASED_OUTPATIENT_CLINIC_OR_DEPARTMENT_OTHER): Payer: Medicare Other | Admitting: Oncology

## 2015-05-03 ENCOUNTER — Other Ambulatory Visit (HOSPITAL_BASED_OUTPATIENT_CLINIC_OR_DEPARTMENT_OTHER): Payer: Medicare Other

## 2015-05-03 ENCOUNTER — Telehealth: Payer: Self-pay | Admitting: Oncology

## 2015-05-03 VITALS — BP 125/57 | HR 62 | Temp 97.6°F | Resp 20 | Ht 72.0 in | Wt 178.4 lb

## 2015-05-03 DIAGNOSIS — G620 Drug-induced polyneuropathy: Secondary | ICD-10-CM

## 2015-05-03 DIAGNOSIS — C180 Malignant neoplasm of cecum: Secondary | ICD-10-CM

## 2015-05-03 DIAGNOSIS — C189 Malignant neoplasm of colon, unspecified: Secondary | ICD-10-CM

## 2015-05-03 DIAGNOSIS — C649 Malignant neoplasm of unspecified kidney, except renal pelvis: Secondary | ICD-10-CM

## 2015-05-03 DIAGNOSIS — Z452 Encounter for adjustment and management of vascular access device: Secondary | ICD-10-CM | POA: Diagnosis not present

## 2015-05-03 DIAGNOSIS — D6959 Other secondary thrombocytopenia: Secondary | ICD-10-CM | POA: Diagnosis not present

## 2015-05-03 DIAGNOSIS — Z95828 Presence of other vascular implants and grafts: Secondary | ICD-10-CM

## 2015-05-03 LAB — COMPREHENSIVE METABOLIC PANEL (CC13)
ALT: 19 U/L (ref 0–55)
AST: 19 U/L (ref 5–34)
Albumin: 4 g/dL (ref 3.5–5.0)
Alkaline Phosphatase: 73 U/L (ref 40–150)
Anion Gap: 7 mEq/L (ref 3–11)
BUN: 20.2 mg/dL (ref 7.0–26.0)
CHLORIDE: 107 meq/L (ref 98–109)
CO2: 27 meq/L (ref 22–29)
CREATININE: 0.9 mg/dL (ref 0.7–1.3)
Calcium: 9.2 mg/dL (ref 8.4–10.4)
Glucose: 116 mg/dl (ref 70–140)
Potassium: 3.7 mEq/L (ref 3.5–5.1)
SODIUM: 141 meq/L (ref 136–145)
TOTAL PROTEIN: 7.1 g/dL (ref 6.4–8.3)
Total Bilirubin: 1.21 mg/dL — ABNORMAL HIGH (ref 0.20–1.20)

## 2015-05-03 LAB — CBC WITH DIFFERENTIAL/PLATELET
BASO%: 0.7 % (ref 0.0–2.0)
Basophils Absolute: 0 10*3/uL (ref 0.0–0.1)
EOS%: 0.7 % (ref 0.0–7.0)
Eosinophils Absolute: 0 10*3/uL (ref 0.0–0.5)
HCT: 38.2 % — ABNORMAL LOW (ref 38.4–49.9)
HGB: 12.8 g/dL — ABNORMAL LOW (ref 13.0–17.1)
LYMPH%: 40.4 % (ref 14.0–49.0)
MCH: 31.9 pg (ref 27.2–33.4)
MCHC: 33.5 g/dL (ref 32.0–36.0)
MCV: 95.2 fL (ref 79.3–98.0)
MONO#: 0.3 10*3/uL (ref 0.1–0.9)
MONO%: 8.6 % (ref 0.0–14.0)
NEUT#: 1.9 10*3/uL (ref 1.5–6.5)
NEUT%: 49.6 % (ref 39.0–75.0)
Platelets: 154 10*3/uL (ref 140–400)
RBC: 4.01 10*6/uL — ABNORMAL LOW (ref 4.20–5.82)
RDW: 13.9 % (ref 11.0–14.6)
WBC: 3.8 10*3/uL — ABNORMAL LOW (ref 4.0–10.3)
lymph#: 1.6 10*3/uL (ref 0.9–3.3)

## 2015-05-03 MED ORDER — SODIUM CHLORIDE 0.9 % IJ SOLN
10.0000 mL | INTRAMUSCULAR | Status: DC | PRN
  Administered 2015-05-03: 10 mL via INTRAVENOUS
  Filled 2015-05-03: qty 10

## 2015-05-03 MED ORDER — HEPARIN SOD (PORK) LOCK FLUSH 100 UNIT/ML IV SOLN
500.0000 [IU] | Freq: Once | INTRAVENOUS | Status: AC
  Administered 2015-05-03: 500 [IU] via INTRAVENOUS
  Filled 2015-05-03: qty 5

## 2015-05-03 NOTE — Progress Notes (Signed)
Hematology and Oncology Follow Up Visit  KORDELL JAFRI [id number] [birth year] 74 y.o. 05/03/2015 12:07 PM [name], MDClark, Don Broach, MD   Principle Diagnosis: 74 year old with colon cancer diagnosed in September 2015. He presented with a cecal mass and found to have a T1 N1 disease. He also has T1 N0 renal cell carcinoma diagnosed at around the same time.   Prior Therapy: He is status post right colectomy done laparoscopically on 04/01/2014. He had a T1 N1 disease was 0 out of 12 lymph nodes involved. He also status post partial nephrectomy on 04/01/2014 for a papillary tumor. FOLFOX adjuvant chemotherapy cycle 1 to given on 05/31/2014. Status post 11 cycles completed on 11/28/2014.  Current therapy: Observation and surveillance.  Interim History:  Mr. Bera presents today for a follow-up visit with his wife. Since the last visit, he reports no new complaints. He continues to recover from chemotherapy and almost back to his baseline.  He does report peripheral neuropathy that is improving gradually and feels Neurontin and helped his symptoms. His activity level has improved and is close to normal.   He does not report any headaches, blurry vision, syncope. He does not report any chest pain, shortness of breath, cough. He does not report any lower extremity edema or palpitation. He does not report any nausea, vomiting, constipation, diarrhea or hematochezia. He does not report any frequency, urgency or hesitancy. He does not report any skeletal complaints. Remainder of his review of systems unremarkable.  Medications: I have reviewed the patient's current medications.  Current Outpatient Prescriptions  Medication Sig Dispense Refill  . acetaminophen (TYLENOL) 500 MG tablet Take 500 mg by mouth every 6 (six) hours as needed.    Marland Kitchen aspirin 81 MG tablet Take 81 mg by mouth daily.      . baclofen (LIORESAL) 10 MG tablet   0  . gabapentin (NEURONTIN) 300 MG capsule Take 1 capsule (300 mg  total) by mouth at bedtime. 30 capsule 3  . irbesartan-hydrochlorothiazide (AVALIDE) 150-12.5 MG per tablet Take 1 tablet by mouth every morning.     . lidocaine-prilocaine (EMLA) cream Apply 1 application topically as needed.  0  . loratadine (CLARITIN) 10 MG tablet Take 10 mg by mouth daily.    . Multiple Vitamin (MULTIVITAMIN) capsule Take 1 capsule by mouth daily.      . ondansetron (ZOFRAN) 8 MG tablet Take 1 tablet (8 mg total) by mouth every 8 (eight) hours as needed. 30 tablet 3  . vitamin E 400 UNIT capsule Take 400 Units by mouth daily.      No current facility-administered medications for this visit.     Allergies:  Allergies  Allergen Reactions  . Codeine Other (See Comments)    constipation    Past Medical History, Surgical history, Social history, and Family History were reviewed and updated.   Physical Exam: Blood pressure 125/57, pulse 62, temperature 97.6 F (36.4 C), temperature source Oral, resp. rate 20, height 6' (1.829 m), weight 178 lb 6.4 oz (80.922 kg), SpO2 100 %. ECOG: 0 General appearance: alert and cooperative well-appearing gentleman without distress. Head: Normocephalic, without obvious abnormality no oral ulcers or lesions. Neck: no adenopathy Lymph nodes: Cervical, supraclavicular, and axillary nodes normal. Heart:regular rate and rhythm, S1, S2 normal, no murmur, click, rub or gallop Lung:chest clear, no wheezing, rales, normal symmetric air entry.  Abdomin: soft, non-tender, without masses or organomegaly EXT:no erythema, induration, or nodules Skin: No rashes or lesions.  Lab Results: Lab Results  Component  Value Date   WBC 3.8* 05/03/2015   HGB 12.8* 05/03/2015   HCT 38.2* 05/03/2015   MCV 95.2 05/03/2015   PLT 154 05/03/2015     Chemistry      Component Value Date/Time   NA 140 12/22/2014 1011   NA 141 05/23/2014 1549   K 4.1 12/22/2014 1011   K 4.1 05/23/2014 1549   CL 104 05/23/2014 1549   CO2 25 12/22/2014 1011   CO2 26  05/23/2014 1549   BUN 15.5 12/22/2014 1011   BUN 17 05/23/2014 1549   CREATININE 0.9 12/22/2014 1011   CREATININE 0.78 05/23/2014 1549      Component Value Date/Time   CALCIUM 9.0 12/22/2014 1011   CALCIUM 9.4 05/23/2014 1549   ALKPHOS 78 12/22/2014 1011   ALKPHOS 55 02/15/2014 1121   AST 23 12/22/2014 1011   AST 24 02/15/2014 1121   ALT 14 12/22/2014 1011   ALT 21 02/15/2014 1121   BILITOT 1.11 12/22/2014 1011   BILITOT 1.0 02/15/2014 1121        Impression and Plan:   74 year old gentleman with the following issues:  1. 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 He is status post laparoscopic right colectomy done on 04/01/2014. He has T1 N1 disease with 1 out of 12 lymph nodes involved.   He is  status post 11 cycles of adjuvant FOLFOX chemotherapy that was well-tolerated without any major complications completed in 11/2014.   CT scan on 12/22/2014 did not show any evidence of relapse disease. The plan is to continue with active surveillance and repeat imaging studies in 2 months.  2. IV access: Port-A-Cath has no complications at this time and will be removed if his neck CT scan is normal.  3. Antiemetics: He is no longer needing any antiemetics and nausea resolved.  4. Renal cell carcinoma status post resection for a T1 N0 disease. The histology showed papillary type. Repeat imaging studies will look for any cancer recurrence.  5. Thrombocytopenia: His platelet count has normalized after discontinuation of chemotherapy. His laboratory data reviewed today and did not show any evidence of decline.  6. Neutropenia prophylaxis: 4 counts have recovered after stopping chemotherapy.  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 He is on Neurontin which of helps his symptoms.  8. Follow-up: In 2 months for a clinical visit.  Zola Button, MD 10/5/201612:07 PM

## 2015-05-03 NOTE — Telephone Encounter (Signed)
Gave and printed appt sched and avs for pt for NOV and DEC...gv barium °

## 2015-05-03 NOTE — Patient Instructions (Signed)

## 2015-05-04 LAB — CEA: CEA: 1.1 ng/mL (ref 0.0–5.0)

## 2015-06-27 ENCOUNTER — Other Ambulatory Visit (HOSPITAL_BASED_OUTPATIENT_CLINIC_OR_DEPARTMENT_OTHER): Payer: Medicare Other

## 2015-06-27 ENCOUNTER — Ambulatory Visit (HOSPITAL_COMMUNITY)
Admission: RE | Admit: 2015-06-27 | Discharge: 2015-06-27 | Disposition: A | Discharge status: Home / Self Care | Payer: Medicare Other | Source: Ambulatory Visit | Attending: Oncology | Admitting: Oncology

## 2015-06-27 ENCOUNTER — Encounter (HOSPITAL_COMMUNITY): Payer: Self-pay

## 2015-06-27 ENCOUNTER — Ambulatory Visit (HOSPITAL_BASED_OUTPATIENT_CLINIC_OR_DEPARTMENT_OTHER): Payer: Medicare Other

## 2015-06-27 VITALS — BP 118/61 | HR 74 | Temp 96.8°F | Resp 16

## 2015-06-27 DIAGNOSIS — Z905 Acquired absence of kidney: Secondary | ICD-10-CM | POA: Diagnosis not present

## 2015-06-27 DIAGNOSIS — N289 Disorder of kidney and ureter, unspecified: Secondary | ICD-10-CM | POA: Diagnosis not present

## 2015-06-27 DIAGNOSIS — I7789 Other specified disorders of arteries and arterioles: Secondary | ICD-10-CM | POA: Insufficient documentation

## 2015-06-27 DIAGNOSIS — C189 Malignant neoplasm of colon, unspecified: Secondary | ICD-10-CM

## 2015-06-27 DIAGNOSIS — Z9049 Acquired absence of other specified parts of digestive tract: Secondary | ICD-10-CM | POA: Insufficient documentation

## 2015-06-27 DIAGNOSIS — Z452 Encounter for adjustment and management of vascular access device: Secondary | ICD-10-CM | POA: Diagnosis not present

## 2015-06-27 LAB — CBC WITH DIFFERENTIAL/PLATELET
BASO%: 0.3 % (ref 0.0–2.0)
BASOS ABS: 0 10*3/uL (ref 0.0–0.1)
EOS%: 0.8 % (ref 0.0–7.0)
Eosinophils Absolute: 0 10*3/uL (ref 0.0–0.5)
HEMATOCRIT: 37.6 % — AB (ref 38.4–49.9)
HGB: 13.1 g/dL (ref 13.0–17.1)
LYMPH#: 1.6 10*3/uL (ref 0.9–3.3)
LYMPH%: 42.1 % (ref 14.0–49.0)
MCH: 32.8 pg (ref 27.2–33.4)
MCHC: 34.8 g/dL (ref 32.0–36.0)
MCV: 94 fL (ref 79.3–98.0)
MONO#: 0.3 10*3/uL (ref 0.1–0.9)
MONO%: 8.9 % (ref 0.0–14.0)
NEUT#: 1.8 10*3/uL (ref 1.5–6.5)
NEUT%: 47.9 % (ref 39.0–75.0)
PLATELETS: 135 10*3/uL — AB (ref 140–400)
RBC: 4 10*6/uL — ABNORMAL LOW (ref 4.20–5.82)
RDW: 13.1 % (ref 11.0–14.6)
WBC: 3.8 10*3/uL — ABNORMAL LOW (ref 4.0–10.3)

## 2015-06-27 LAB — COMPREHENSIVE METABOLIC PANEL (CC13)
ALT: 22 U/L (ref 0–55)
ANION GAP: 8 meq/L (ref 3–11)
AST: 22 U/L (ref 5–34)
Albumin: 3.9 g/dL (ref 3.5–5.0)
Alkaline Phosphatase: 74 U/L (ref 40–150)
BUN: 18.2 mg/dL (ref 7.0–26.0)
CHLORIDE: 104 meq/L (ref 98–109)
CO2: 27 mEq/L (ref 22–29)
Calcium: 9.4 mg/dL (ref 8.4–10.4)
Creatinine: 1 mg/dL (ref 0.7–1.3)
EGFR: >90 mL/min/{1.73_m2} (ref >90)
Glucose: 110 mg/dl (ref 70–140)
POTASSIUM: 3.7 meq/L (ref 3.5–5.1)
Sodium: 138 mEq/L (ref 136–145)
Total Bilirubin: 1.06 mg/dL (ref 0.20–1.20)
Total Protein: 7.1 g/dL (ref 6.4–8.3)

## 2015-06-27 MED ORDER — SODIUM CHLORIDE 0.9 % IJ SOLN
10.0000 mL | INTRAMUSCULAR | Status: DC | PRN
  Administered 2015-06-27: 10 mL via INTRAVENOUS
  Filled 2015-06-27: qty 10

## 2015-06-27 MED ORDER — IOHEXOL 300 MG/ML  SOLN
100.0000 mL | Freq: Once | INTRAMUSCULAR | Status: AC | PRN
  Administered 2015-06-27: 100 mL via INTRAVENOUS

## 2015-06-27 NOTE — Patient Instructions (Signed)

## 2015-06-27 NOTE — Progress Notes (Signed)
Discussed with patient that PAC will be flushed and de-accessed in CT or else he will need to return here to have that done

## 2015-06-29 ENCOUNTER — Ambulatory Visit (HOSPITAL_BASED_OUTPATIENT_CLINIC_OR_DEPARTMENT_OTHER): Payer: Medicare Other | Admitting: Oncology

## 2015-06-29 ENCOUNTER — Telehealth: Payer: Self-pay | Admitting: Oncology

## 2015-06-29 VITALS — BP 135/69 | HR 82 | Temp 97.7°F | Resp 18 | Ht 72.0 in | Wt 181.5 lb

## 2015-06-29 DIAGNOSIS — Z85528 Personal history of other malignant neoplasm of kidney: Secondary | ICD-10-CM

## 2015-06-29 DIAGNOSIS — G620 Drug-induced polyneuropathy: Secondary | ICD-10-CM | POA: Diagnosis not present

## 2015-06-29 DIAGNOSIS — D696 Thrombocytopenia, unspecified: Secondary | ICD-10-CM | POA: Diagnosis not present

## 2015-06-29 DIAGNOSIS — C180 Malignant neoplasm of cecum: Secondary | ICD-10-CM

## 2015-06-29 DIAGNOSIS — C189 Malignant neoplasm of colon, unspecified: Secondary | ICD-10-CM

## 2015-06-29 NOTE — Telephone Encounter (Signed)
Gave and printed appt sched and avs for pt for June 2017.....gv barium

## 2015-06-29 NOTE — Progress Notes (Signed)
Hematology and Oncology Follow Up Visit  Jorge Ross [id number] 1940-09-16 74 y.o. 06/29/2015 12:43 PM Jorge Ross, MDClark, Don Broach, MD   Principle Diagnosis: 74 year old with colon cancer diagnosed in September 2015. He presented with a cecal mass and found to have a T1 N1 disease. He also has T1 N0 renal cell carcinoma diagnosed at around the same time.   Prior Therapy: He is status post right colectomy done laparoscopically on 04/01/2014. He had a T1 N1 disease was 0 out of 12 lymph nodes involved. He also status post partial nephrectomy on 04/01/2014 for a papillary tumor. FOLFOX adjuvant chemotherapy cycle 1 to given on 05/31/2014. Status post 11 cycles completed on 11/28/2014.  Current therapy: Observation and surveillance.  Interim History:  Jorge Ross presents today for a follow-up visit with his wife. Since the last visit, he continues to do well. He has reported residual sensory neuropathy in his fingers that is improved over the last few months. He does take Neurontin at nighttime which have helped his symptoms. Otherwise has no other residual complications. He has not reported any nausea or vomiting. Bowels are moving regularly. His energy and performance status is back to baseline.   He does not report any headaches, blurry vision, syncope. He does not report any chest pain, shortness of breath, cough. He does not report any lower extremity edema or palpitation. He does not report any nausea, vomiting, constipation, diarrhea or hematochezia. He does not report any frequency, urgency or hesitancy. He does not report any skeletal complaints. Remainder of his review of systems unremarkable.  Medications: I have reviewed the patient's current medications.  Current Outpatient Prescriptions  Medication Sig Dispense Refill  . acetaminophen (TYLENOL) 500 MG tablet Take 500 mg by mouth every 6 (six) hours as needed.    Marland Kitchen aspirin 81 MG tablet Take 81 mg by mouth daily.      .  baclofen (LIORESAL) 10 MG tablet   0  . gabapentin (NEURONTIN) 300 MG capsule Take 1 capsule (300 mg total) by mouth at bedtime. 30 capsule 3  . irbesartan-hydrochlorothiazide (AVALIDE) 150-12.5 MG per tablet Take 1 tablet by mouth every morning.     . lidocaine-prilocaine (EMLA) cream Apply 1 application topically as needed.  0  . loratadine (CLARITIN) 10 MG tablet Take 10 mg by mouth daily.    . Multiple Vitamin (MULTIVITAMIN) capsule Take 1 capsule by mouth daily.      . ondansetron (ZOFRAN) 8 MG tablet Take 1 tablet (8 mg total) by mouth every 8 (eight) hours as needed. 30 tablet 3  . vitamin E 400 UNIT capsule Take 400 Units by mouth daily.      No current facility-administered medications for this visit.     Allergies:  Allergies  Allergen Reactions  . Codeine Other (See Comments)    constipation    Past Medical History, Surgical history, Social history, and Family History were reviewed and updated.   Physical Exam: Blood pressure 135/69, pulse 82, temperature 97.7 F (36.5 C), temperature source Oral, resp. rate 18, height 6' (1.829 m), weight 181 lb 8 oz (82.328 kg), SpO2 98 %. ECOG: 0 General appearance:  well-appearing gentleman . Awake and alert. Head: Normocephalic, without obvious abnormality no oral thrush. Neck: no adenopathy Lymph nodes: Cervical, supraclavicular, and axillary nodes normal. Heart:regular rate and rhythm, S1, S2 normal, no murmur, click, rub or gallop Lung:chest clear, no wheezing, rales, normal symmetric air entry.  Abdomin: soft, non-tender, without masses or organomegaly no shifting dullness  or ascites. EXT:no erythema, induration, or nodules Skin: No rashes or lesions.  Lab Results: Lab Results  Component Value Date   WBC 3.8* 06/27/2015   HGB 13.1 06/27/2015   HCT 37.6* 06/27/2015   MCV 94.0 06/27/2015   PLT 135* 06/27/2015     Chemistry      Component Value Date/Time   NA 138 06/27/2015 1008   NA 141 05/23/2014 1549   K 3.7  06/27/2015 1008   K 4.1 05/23/2014 1549   CL 104 05/23/2014 1549   CO2 27 06/27/2015 1008   CO2 26 05/23/2014 1549   BUN 18.2 06/27/2015 1008   BUN 17 05/23/2014 1549   CREATININE 1.0 06/27/2015 1008   CREATININE 0.78 05/23/2014 1549      Component Value Date/Time   CALCIUM 9.4 06/27/2015 1008   CALCIUM 9.4 05/23/2014 1549   ALKPHOS 74 06/27/2015 1008   ALKPHOS 55 02/15/2014 1121   AST 22 06/27/2015 1008   AST 24 02/15/2014 1121   ALT 22 06/27/2015 1008   ALT 21 02/15/2014 1121   BILITOT 1.06 06/27/2015 1008   BILITOT 1.0 02/15/2014 1121     EXAM: CT CHEST, ABDOMEN, AND PELVIS WITH CONTRAST  TECHNIQUE: Multidetector CT imaging of the chest, abdomen and pelvis was performed following the standard protocol during bolus administration of intravenous contrast.  CONTRAST: 111mL OMNIPAQUE IOHEXOL 300 MG/ML SOLN  COMPARISON: CTs 12/22/2014 and 08/05/2014.  FINDINGS: CT CHEST FINDINGS  Mediastinum/Nodes: There are no enlarged mediastinal, hilar or axillary lymph nodes. The thyroid gland, trachea and esophagus demonstrate no significant findings. The heart size is normal. There is no pericardial effusion. Left subclavian Port-A-Cath tip extends to the SVC right atrial junction. Peripherally calcified ductus diverticulum again noted, measuring up to 1.9 cm transverse and protruding 2.1 cm beyond the aortic lumen into the AP window.  Lungs/Pleura: There is no pleural effusion.The lungs are clear. Azygos fissure again noted.  Musculoskeletal/Chest wall: No chest wall mass or suspicious osseous findings. Mild bilateral gynecomastia.  CT ABDOMEN AND PELVIS FINDINGS  Hepatobiliary: The liver is normal in density without focal abnormality. No evidence of gallstones, gallbladder wall thickening or biliary dilatation.  Pancreas: Unremarkable. No pancreatic ductal dilatation or surrounding inflammatory changes.  Spleen: Normal in size without focal  abnormality.  Adrenals/Urinary Tract: Both adrenal glands appear stable with mild fullness. Stable mild scarring in the interpolar region of the left kidney at site of previous partial nephrectomy. Adjacent perinephric fluid has improved. The adjacent left renal cyst measuring 1.5 cm is unchanged. There are additional tiny renal cysts bilaterally. Perinephric soft tissue stranding is unchanged. There is no hydronephrosis or evidence of urinary tract calculus. No significant bladder findings are evident allowing for incomplete distention.  Stomach/Bowel: No evidence of bowel wall thickening, distention or surrounding inflammatory change. Stable postsurgical changes from previous right hemicolectomy.  Vascular/Lymphatic: There are no enlarged abdominal or pelvic lymph nodes. Small retroperitoneal lymph nodes are unchanged. There are no significant vascular findings.  Reproductive: Stable mild enlargement of the prostate gland.  Other: Stable postsurgical changes in the anterior abdominal wall and small umbilical hernia containing only fat. No ascites or peritoneal nodularity.  Musculoskeletal: No acute or significant osseous findings. 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  IMPRESSION: 1. No evidence of metastatic disease within the chest, abdomen or pelvis. 2. Stable postsurgical changes status post right hemicolectomy and partial left nephrectomy. 3. Stable low-density renal lesions bilaterally. 4. 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  Impression and Plan:   74 year old gentleman with the following issues:  1. 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 He is status post laparoscopic right colectomy done on 04/01/2014. He has T1 N1 disease with 1 out of 12 lymph nodes involved.   He is  status post 11 cycles of adjuvant FOLFOX chemotherapy that was well-tolerated without any major complications completed in  11/2014.   CT scan on 06/27/2015 was reviewed today and discussed with the patient and did not show any evidence of relapse disease. The plan is to continue with active surveillance and repeat imaging studies in 6 months.  2. IV access: Port-A-Cath has no complications at this time. Options of removing the Port-A-Cath versus keeping it for the time being was discussed with the patient and we have opted to keep it at least for another 6 months. If his disease continues to be in remission, we'll remove the Port-A-Cath at that time.  3. Renal cell carcinoma status post resection for a T1 N0 disease. The histology showed papillary type. Repeat imaging studies will look for any cancer recurrence.  4. Thrombocytopenia: His platelet count close to baseline after the conclusion of chemotherapy.  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 He is on Neurontin which of helps his symptoms.  6. Follow-up will be in 6 months and repeat imaging studies at that time.  Zola Button, MD 12/1/201612:43 PM

## 2015-07-07 ENCOUNTER — Telehealth: Payer: Self-pay | Admitting: *Deleted

## 2015-07-07 ENCOUNTER — Encounter: Payer: Self-pay | Admitting: *Deleted

## 2015-07-07 NOTE — Telephone Encounter (Signed)
Call received from patient reporting "this morning he urinated, passing blood in his urine.  Who do I need to notify, Dr. Tammi Klippel or Dr. Alen Blew?"  Admits to some pain and discomfort with urination before this mornings passing blood.  This nurse advised he call Dr. Tresa Moore at [facility] urology [phone]  This nurse will notify Dr. Alen Blew.  Return number [phone].

## 2015-07-07 NOTE — Progress Notes (Signed)
Patient at [facility] urology to see dr Tammi Klippel re: blood in his urine.

## 2015-07-10 NOTE — Telephone Encounter (Signed)
Progress Notes      Randolm Idol, RN at 07/07/2015 1:24 PM     Status: Signed       Expand All Collapse All   Patient at [facility] urology to see dr Tammi Klippel re: blood in his urine.

## 2015-08-28 ENCOUNTER — Telehealth: Payer: Self-pay | Admitting: Oncology

## 2015-08-28 NOTE — Telephone Encounter (Signed)
Pt called to schedule flush due to they were not added to schedule per 11/29 pt will p/u schedule at next visit... KJ

## 2015-08-29 ENCOUNTER — Ambulatory Visit (HOSPITAL_BASED_OUTPATIENT_CLINIC_OR_DEPARTMENT_OTHER): Payer: Medicare Other

## 2015-08-29 VITALS — BP 133/47 | HR 48 | Temp 97.8°F | Resp 16

## 2015-08-29 DIAGNOSIS — C180 Malignant neoplasm of cecum: Secondary | ICD-10-CM | POA: Diagnosis not present

## 2015-08-29 DIAGNOSIS — Z452 Encounter for adjustment and management of vascular access device: Secondary | ICD-10-CM

## 2015-08-29 DIAGNOSIS — Z95828 Presence of other vascular implants and grafts: Secondary | ICD-10-CM

## 2015-08-29 MED ORDER — SODIUM CHLORIDE 0.9% FLUSH
10.0000 mL | INTRAVENOUS | Status: DC | PRN
Start: 2015-08-29 — End: 2015-08-29
  Administered 2015-08-29: 10 mL via INTRAVENOUS
  Filled 2015-08-29: qty 10

## 2015-08-29 MED ORDER — HEPARIN SOD (PORK) LOCK FLUSH 100 UNIT/ML IV SOLN
500.0000 [IU] | Freq: Once | INTRAVENOUS | Status: AC
  Administered 2015-08-29: 500 [IU] via INTRAVENOUS
  Filled 2015-08-29: qty 5

## 2015-08-29 NOTE — Patient Instructions (Signed)

## 2015-10-24 ENCOUNTER — Other Ambulatory Visit: Payer: Self-pay | Admitting: *Deleted

## 2015-10-24 ENCOUNTER — Ambulatory Visit (HOSPITAL_BASED_OUTPATIENT_CLINIC_OR_DEPARTMENT_OTHER): Payer: Medicare Other

## 2015-10-24 DIAGNOSIS — C180 Malignant neoplasm of cecum: Secondary | ICD-10-CM

## 2015-10-24 DIAGNOSIS — C189 Malignant neoplasm of colon, unspecified: Secondary | ICD-10-CM

## 2015-10-24 DIAGNOSIS — Z452 Encounter for adjustment and management of vascular access device: Secondary | ICD-10-CM | POA: Diagnosis not present

## 2015-10-24 DIAGNOSIS — Z95828 Presence of other vascular implants and grafts: Secondary | ICD-10-CM

## 2015-10-24 MED ORDER — SODIUM CHLORIDE 0.9% FLUSH
10.0000 mL | INTRAVENOUS | Status: DC | PRN
Start: 2015-10-24 — End: 2015-10-24
  Administered 2015-10-24: 10 mL via INTRAVENOUS
  Filled 2015-10-24: qty 10

## 2015-10-24 MED ORDER — HEPARIN SOD (PORK) LOCK FLUSH 100 UNIT/ML IV SOLN
500.0000 [IU] | Freq: Once | INTRAVENOUS | Status: AC
  Administered 2015-10-24: 500 [IU] via INTRAVENOUS
  Filled 2015-10-24: qty 5

## 2015-10-24 MED ORDER — LIDOCAINE-PRILOCAINE 2.5-2.5 % EX CREA: TOPICAL_CREAM | CUTANEOUS | Status: DC

## 2015-10-24 NOTE — Patient Instructions (Signed)

## 2015-11-06 ENCOUNTER — Encounter: Payer: Self-pay | Admitting: *Deleted

## 2016-01-02 ENCOUNTER — Other Ambulatory Visit (HOSPITAL_BASED_OUTPATIENT_CLINIC_OR_DEPARTMENT_OTHER): Payer: Medicare Other

## 2016-01-02 ENCOUNTER — Ambulatory Visit (HOSPITAL_BASED_OUTPATIENT_CLINIC_OR_DEPARTMENT_OTHER): Payer: Medicare Other

## 2016-01-02 ENCOUNTER — Encounter (HOSPITAL_COMMUNITY): Payer: Self-pay

## 2016-01-02 ENCOUNTER — Ambulatory Visit (HOSPITAL_COMMUNITY)
Admission: RE | Admit: 2016-01-02 | Discharge: 2016-01-02 | Disposition: A | Discharge status: Home / Self Care | Payer: Medicare Other | Source: Ambulatory Visit | Attending: Oncology | Admitting: Oncology

## 2016-01-02 DIAGNOSIS — C189 Malignant neoplasm of colon, unspecified: Secondary | ICD-10-CM | POA: Insufficient documentation

## 2016-01-02 DIAGNOSIS — Z9049 Acquired absence of other specified parts of digestive tract: Secondary | ICD-10-CM | POA: Insufficient documentation

## 2016-01-02 DIAGNOSIS — C180 Malignant neoplasm of cecum: Secondary | ICD-10-CM

## 2016-01-02 DIAGNOSIS — Z95828 Presence of other vascular implants and grafts: Secondary | ICD-10-CM | POA: Insufficient documentation

## 2016-01-02 DIAGNOSIS — Z905 Acquired absence of kidney: Secondary | ICD-10-CM | POA: Diagnosis not present

## 2016-01-02 DIAGNOSIS — Z452 Encounter for adjustment and management of vascular access device: Secondary | ICD-10-CM

## 2016-01-02 DIAGNOSIS — I700 Atherosclerosis of aorta: Secondary | ICD-10-CM | POA: Insufficient documentation

## 2016-01-02 LAB — CBC WITH DIFFERENTIAL/PLATELET
BASO%: 0.2 % (ref 0.0–2.0)
BASOS ABS: 0 10*3/uL (ref 0.0–0.1)
EOS ABS: 0 10*3/uL (ref 0.0–0.5)
EOS%: 0.7 % (ref 0.0–7.0)
HCT: 39.1 % (ref 38.4–49.9)
HEMOGLOBIN: 13.5 g/dL (ref 13.0–17.1)
LYMPH%: 46.9 % (ref 14.0–49.0)
MCH: 32.3 pg (ref 27.2–33.4)
MCHC: 34.5 g/dL (ref 32.0–36.0)
MCV: 93.5 fL (ref 79.3–98.0)
MONO#: 0.4 10*3/uL (ref 0.1–0.9)
MONO%: 10.6 % (ref 0.0–14.0)
NEUT#: 1.7 10*3/uL (ref 1.5–6.5)
NEUT%: 41.6 % (ref 39.0–75.0)
PLATELETS: 138 10*3/uL — AB (ref 140–400)
RBC: 4.18 10*6/uL — ABNORMAL LOW (ref 4.20–5.82)
RDW: 12.8 % (ref 11.0–14.6)
WBC: 4.1 10*3/uL (ref 4.0–10.3)
lymph#: 1.9 10*3/uL (ref 0.9–3.3)

## 2016-01-02 LAB — COMPREHENSIVE METABOLIC PANEL
ALK PHOS: 61 U/L (ref 40–150)
ALT: 19 U/L (ref 0–55)
AST: 21 U/L (ref 5–34)
Albumin: 4.1 g/dL (ref 3.5–5.0)
Anion Gap: 7 mEq/L (ref 3–11)
BUN: 20 mg/dL (ref 7.0–26.0)
CALCIUM: 9.4 mg/dL (ref 8.4–10.4)
CO2: 28 mEq/L (ref 22–29)
Chloride: 105 mEq/L (ref 98–109)
Creatinine: 1 mg/dL (ref 0.7–1.3)
Glucose: 90 mg/dl (ref 70–140)
POTASSIUM: 4.1 meq/L (ref 3.5–5.1)
Sodium: 140 mEq/L (ref 136–145)
Total Bilirubin: 1.03 mg/dL (ref 0.20–1.20)
Total Protein: 7.2 g/dL (ref 6.4–8.3)

## 2016-01-02 MED ORDER — IOPAMIDOL (ISOVUE-300) INJECTION 61%
100.0000 mL | Freq: Once | INTRAVENOUS | Status: AC | PRN
  Administered 2016-01-02: 100 mL via INTRAVENOUS

## 2016-01-02 MED ORDER — SODIUM CHLORIDE 0.9 % IJ SOLN
10.0000 mL | INTRAMUSCULAR | Status: DC | PRN
  Administered 2016-01-02: 10 mL via INTRAVENOUS
  Filled 2016-01-02: qty 10

## 2016-01-03 ENCOUNTER — Ambulatory Visit (HOSPITAL_BASED_OUTPATIENT_CLINIC_OR_DEPARTMENT_OTHER): Payer: Medicare Other | Admitting: Oncology

## 2016-01-03 VITALS — BP 122/60 | HR 40 | Temp 97.5°F | Resp 17 | Ht 72.0 in | Wt 186.4 lb

## 2016-01-03 DIAGNOSIS — Z85528 Personal history of other malignant neoplasm of kidney: Secondary | ICD-10-CM | POA: Diagnosis not present

## 2016-01-03 DIAGNOSIS — Z85038 Personal history of other malignant neoplasm of large intestine: Secondary | ICD-10-CM

## 2016-01-03 DIAGNOSIS — G620 Drug-induced polyneuropathy: Secondary | ICD-10-CM | POA: Diagnosis not present

## 2016-01-03 DIAGNOSIS — C189 Malignant neoplasm of colon, unspecified: Secondary | ICD-10-CM

## 2016-01-03 LAB — CEA (PARALLEL TESTING): CEA: 1 ng/mL

## 2016-01-03 NOTE — Progress Notes (Signed)
Hematology and Oncology Follow Up Visit  Jorge Ross [id number] [birth year] 75 y.o. 01/03/2016 2:14 PM [name], MDClark, Don Broach, MD   Principle Diagnosis: 75 year old with colon cancer diagnosed in September 2015. He presented with a cecal mass and found to have a T1 N1 disease. He also has T1 N0 renal cell carcinoma diagnosed at around the same time.   Prior Therapy: He is status post right colectomy done laparoscopically on 04/01/2014. He had a T1 N1 disease was 0 out of 12 lymph nodes involved. He also status post partial nephrectomy on 04/01/2014 for a papillary tumor. FOLFOX adjuvant chemotherapy cycle 1 to given on 05/31/2014. Status post 11 cycles completed on 11/28/2014.  Current therapy: Observation and surveillance.  Interim History:  [name] presents today for a follow-up visit with his wife. Since the last visit, he reports no recent complaints. 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 It is noted is grade 1 and does not interfere with his function. He does take Neurontin at nighttime which have helped his symptoms.  He has not reported any nausea or vomiting. Bowels are moving regularly. His performance status and activity level remain excellent.   He does not report any headaches, blurry vision, syncope. He does not report any chest pain, shortness of breath, cough. He does not report any lower extremity edema or palpitation. He does not report any nausea, vomiting, constipation, diarrhea or hematochezia. He does not report any frequency, urgency or hesitancy. He does not report any skeletal complaints. Remainder of his review of systems unremarkable.  Medications: I have reviewed the patient's current medications.  Current Outpatient Prescriptions  Medication Sig Dispense Refill  . acetaminophen (TYLENOL) 500 MG tablet Take 500 mg by mouth every 6 (six) hours as needed.    Marland Kitchen aspirin 81 MG tablet Take  81 mg by mouth daily.      Marland Kitchen gabapentin (NEURONTIN) 300 MG capsule Take 1 capsule (300 mg total) by mouth at bedtime. 30 capsule 3  . irbesartan-hydrochlorothiazide (AVALIDE) 150-12.5 MG per tablet Take 1 tablet by mouth every morning.     . loratadine (CLARITIN) 10 MG tablet Take 10 mg by mouth daily.    . Multiple Vitamin (MULTIVITAMIN) capsule Take 1 capsule by mouth daily.      . vitamin E 400 UNIT capsule Take 400 Units by mouth daily.      No current facility-administered medications for this visit.     Allergies:  Allergies  Allergen Reactions  . Codeine Other (See Comments)    constipation    Past Medical History, Surgical history, Social history, and Family History were reviewed and updated.   Physical Exam: Blood pressure 122/60, pulse 40, temperature 97.5 F (36.4 C), temperature source Oral, resp. rate 17, height 6' (1.829 m), weight 186 lb 6.4 oz (84.55 kg), SpO2 100 %. ECOG: 0 General appearance: Present appearing gentleman without distress. Head: Normocephalic, without obvious abnormality no oral thrush noted. Neck: no adenopathy Lymph nodes: Cervical, supraclavicular, and axillary nodes normal. Heart:regular rate and rhythm, S1, S2 normal, no murmur, click, rub or gallop Lung:chest clear, no wheezing, rales, normal symmetric air entry.  Abdomin: soft, non-tender, without masses or organomegaly no rebound or guarding. EXT:no erythema, induration, or nodules Skin: No rashes or lesions.  Lab Results: Lab Results  Component Value Date   WBC 4.1 01/02/2016   HGB 13.5 01/02/2016   HCT 39.1 01/02/2016   MCV 93.5 01/02/2016   PLT  138* 01/02/2016     Chemistry      Component Value Date/Time   NA 140 01/02/2016 1113   NA 141 05/23/2014 1549   K 4.1 01/02/2016 1113   K 4.1 05/23/2014 1549   CL 104 05/23/2014 1549   CO2 28 01/02/2016 1113   CO2 26 05/23/2014 1549   BUN 20.0 01/02/2016 1113   BUN 17 05/23/2014 1549   CREATININE 1.0 01/02/2016 1113    CREATININE 0.78 05/23/2014 1549      Component Value Date/Time   CALCIUM 9.4 01/02/2016 1113   CALCIUM 9.4 05/23/2014 1549   ALKPHOS 61 01/02/2016 1113   ALKPHOS 55 02/15/2014 1121   AST 21 01/02/2016 1113   AST 24 02/15/2014 1121   ALT 19 01/02/2016 1113   ALT 21 02/15/2014 1121   BILITOT 1.03 01/02/2016 1113   BILITOT 1.0 02/15/2014 1121       EXAM: CT CHEST, ABDOMEN, AND PELVIS WITH CONTRAST  TECHNIQUE: Multidetector CT imaging of the chest, abdomen and pelvis was performed following the standard protocol during bolus administration of intravenous contrast.  CONTRAST: 178mL ISOVUE-300 IOPAMIDOL (ISOVUE-300) INJECTION 61%  COMPARISON: 06/27/2015  FINDINGS: CT CHEST FINDINGS  Mediastinum/Lymph Nodes: No chest wall mass, supraclavicular or axillary lymphadenopathy. The left-sided Port-A-Cath is stable.  The thyroid gland is normal.  The heart is normal in size. No pericardial effusion. The aorta is normal in caliber. No dissection. The branch vessels are patent. 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  No mediastinal or hilar mass or lymphadenopathy. The esophagus is grossly normal.  Lungs/Pleura: No acute pulmonary findings. No worrisome pulmonary lesions to suggest metastatic disease. No pleural effusion. No interstitial lung disease or bronchiectasis.  Musculoskeletal: No significant bony findings.  CT ABDOMEN PELVIS FINDINGS  Hepatobiliary: No hepatic lesions to suggest metastatic disease. No biliary dilatation. The gallbladder is normal. No common bile duct dilatation.  Pancreas: No mass, inflammation or ductal dilatation.  Spleen: Normal size. No focal lesions.  Adrenals/Urinary Tract: Stable small left adrenal gland nodule.  Small bilateral renal cysts but no worrisome renal lesions. Stable surgical changes from a partial left nephrectomy.  Stomach/Bowel: The stomach, duodenum, small bowel and  colon are unremarkable. No inflammatory changes, mass lesions or obstructive findings. Stable surgical changes from a partial right colectomy. No findings for recurrent tumor.  Vascular/Lymphatic: No mesenteric or retroperitoneal mass or adenopathy. Small scattered lymph nodes are stable. The aorta and branch vessels are normal. The major venous structures are patent.  Reproductive: The prostate gland is mildly enlarged but stable. Possible TURP defect. The bladder is normal. No pelvic mass or adenopathy.  Other: No inguinal mass or adenopathy.  Musculoskeletal: No significant bony findings. Goal suspect multilevel spinal stenosis in the lower lumbar spine.  IMPRESSION: 1. Status post partial right colectomy and partial left nephrectomy. No findings for recurrent tumor or metastatic disease. 2. No acute abdominal/pelvic findings. 3. No acute pulmonary findings. 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     Impression and Plan:   75 year old gentleman with the following issues:  1. 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 He is status post laparoscopic right colectomy done on 04/01/2014. He has T1 N1 disease with 1 out of 12 lymph nodes involved.   He is  status post 11 cycles of adjuvant FOLFOX chemotherapy that was well-tolerated without any major complications completed in 11/2014.   CT scan on 01/02/2016 was reviewed today and discussed with the patient and did  not show any evidence of relapse disease.   The plan is to continue with active surveillance and repeat imaging studies in 12 months. He will have repeat clinical exam and laboratory testing in 6 months.  2. IV access: Port-A-Cath has no complications at this time. Options of removing the Port-A-Cath versus keeping it for the time being was discussed with the patient and we have opted to remove it at this time. I will send a request to Dr. Barry Dienes to kindly remove his  Port-A-Cath in the near future.  3. Renal cell carcinoma status post resection for a T1 N0 disease. The histology showed papillary type. Repeat imaging studies will look for any cancer recurrence.  4. Colonoscopy surveillance: He had not had a colonoscopy since his diagnosis and he is overdue at this time. I will send a referral to gastroenterology for a colonoscopy in the near future.  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 He is on Neurontin which of helps his symptoms.  6. Follow-up will be in 6 months and repeat imaging studies in 12 months.  [facility], MD 6/7/20172:14 PM

## 2016-01-05 ENCOUNTER — Telehealth: Payer: Self-pay | Admitting: Oncology

## 2016-01-05 NOTE — Telephone Encounter (Signed)
Returned call and lvm for Pt and gv appt for Dec

## 2016-01-08 ENCOUNTER — Telehealth: Payer: Self-pay | Admitting: Oncology

## 2016-01-08 NOTE — Telephone Encounter (Signed)
Pt came in for copy of sched .Marland Kitchen Gave pt calender

## 2016-01-26 ENCOUNTER — Encounter: Payer: Self-pay | Admitting: Gastroenterology

## 2016-01-29 ENCOUNTER — Other Ambulatory Visit: Payer: Self-pay | Admitting: General Surgery

## 2016-01-31 ENCOUNTER — Encounter: Payer: Self-pay | Admitting: Gastroenterology

## 2016-02-08 ENCOUNTER — Other Ambulatory Visit: Payer: Self-pay | Admitting: General Surgery

## 2016-02-16 ENCOUNTER — Encounter (HOSPITAL_BASED_OUTPATIENT_CLINIC_OR_DEPARTMENT_OTHER): Payer: Self-pay | Admitting: *Deleted

## 2016-02-20 ENCOUNTER — Other Ambulatory Visit: Payer: Self-pay

## 2016-02-20 ENCOUNTER — Encounter (HOSPITAL_BASED_OUTPATIENT_CLINIC_OR_DEPARTMENT_OTHER)
Admission: RE | Admit: 2016-02-20 | Discharge: 2016-02-20 | Disposition: A | Discharge status: Home / Self Care | Payer: Medicare Other | Source: Ambulatory Visit | Attending: General Surgery | Admitting: General Surgery

## 2016-02-20 DIAGNOSIS — Z905 Acquired absence of kidney: Secondary | ICD-10-CM | POA: Diagnosis not present

## 2016-02-20 DIAGNOSIS — Z85038 Personal history of other malignant neoplasm of large intestine: Secondary | ICD-10-CM | POA: Diagnosis not present

## 2016-02-20 DIAGNOSIS — I10 Essential (primary) hypertension: Secondary | ICD-10-CM | POA: Diagnosis not present

## 2016-02-20 DIAGNOSIS — Z85528 Personal history of other malignant neoplasm of kidney: Secondary | ICD-10-CM | POA: Diagnosis not present

## 2016-02-20 DIAGNOSIS — Z452 Encounter for adjustment and management of vascular access device: Secondary | ICD-10-CM | POA: Diagnosis not present

## 2016-02-20 LAB — BASIC METABOLIC PANEL
Anion gap: 6 (ref 5–15)
BUN: 17 mg/dL (ref 6–20)
CHLORIDE: 105 mmol/L (ref 101–111)
CO2: 28 mmol/L (ref 22–32)
CREATININE: 0.96 mg/dL (ref 0.61–1.24)
Calcium: 9.3 mg/dL (ref 8.9–10.3)
Glucose, Bld: 105 mg/dL — ABNORMAL HIGH (ref 65–99)
POTASSIUM: 4.3 mmol/L (ref 3.5–5.1)
SODIUM: 139 mmol/L (ref 135–145)

## 2016-02-20 NOTE — Pre-Procedure Instructions (Signed)
EKG and old EKG from 2015 reviewed by Dr. Al Corpus; pt. OK to come for surgery.

## 2016-02-21 ENCOUNTER — Ambulatory Visit (HOSPITAL_BASED_OUTPATIENT_CLINIC_OR_DEPARTMENT_OTHER)
Admission: RE | Admit: 2016-02-21 | Discharge: 2016-02-21 | Disposition: A | Discharge status: Home / Self Care | Payer: Medicare Other | Source: Ambulatory Visit | Attending: General Surgery | Admitting: General Surgery

## 2016-02-21 ENCOUNTER — Ambulatory Visit (HOSPITAL_BASED_OUTPATIENT_CLINIC_OR_DEPARTMENT_OTHER): Payer: Medicare Other | Admitting: Certified Registered"

## 2016-02-21 ENCOUNTER — Encounter (HOSPITAL_BASED_OUTPATIENT_CLINIC_OR_DEPARTMENT_OTHER): Payer: Self-pay | Admitting: Certified Registered"

## 2016-02-21 ENCOUNTER — Encounter (HOSPITAL_BASED_OUTPATIENT_CLINIC_OR_DEPARTMENT_OTHER)
Admission: RE | Disposition: A | Discharge status: Home / Self Care | Payer: Self-pay | Source: Ambulatory Visit | Attending: General Surgery

## 2016-02-21 DIAGNOSIS — Z85528 Personal history of other malignant neoplasm of kidney: Secondary | ICD-10-CM | POA: Diagnosis not present

## 2016-02-21 DIAGNOSIS — Z905 Acquired absence of kidney: Secondary | ICD-10-CM | POA: Insufficient documentation

## 2016-02-21 DIAGNOSIS — I10 Essential (primary) hypertension: Secondary | ICD-10-CM | POA: Diagnosis not present

## 2016-02-21 DIAGNOSIS — Z85038 Personal history of other malignant neoplasm of large intestine: Secondary | ICD-10-CM | POA: Insufficient documentation

## 2016-02-21 DIAGNOSIS — Z452 Encounter for adjustment and management of vascular access device: Secondary | ICD-10-CM | POA: Diagnosis not present

## 2016-02-21 HISTORY — PX: PORT-A-CATH REMOVAL: SHX5289

## 2016-02-21 SURGERY — REMOVAL PORT-A-CATH
Anesthesia: Monitor Anesthesia Care | Site: Chest

## 2016-02-21 MED ORDER — FENTANYL CITRATE (PF) 100 MCG/2ML IJ SOLN
INTRAMUSCULAR | Status: AC
  Filled 2016-02-21: qty 2

## 2016-02-21 MED ORDER — LIDOCAINE 2% (20 MG/ML) 5 ML SYRINGE
INTRAMUSCULAR | Status: AC
  Filled 2016-02-21: qty 5

## 2016-02-21 MED ORDER — FENTANYL CITRATE (PF) 100 MCG/2ML IJ SOLN
50.0000 ug | INTRAMUSCULAR | Status: DC | PRN
  Administered 2016-02-21: 25 ug via INTRAVENOUS

## 2016-02-21 MED ORDER — CHLORHEXIDINE GLUCONATE CLOTH 2 % EX PADS: 6.0000 | MEDICATED_PAD | Freq: Once | CUTANEOUS | Status: DC

## 2016-02-21 MED ORDER — FENTANYL CITRATE (PF) 100 MCG/2ML IJ SOLN: 25.0000 ug | INTRAMUSCULAR | Status: DC | PRN

## 2016-02-21 MED ORDER — SODIUM CHLORIDE 0.9% FLUSH: 3.0000 mL | Freq: Two times a day (BID) | INTRAVENOUS | Status: DC

## 2016-02-21 MED ORDER — PROPOFOL 500 MG/50ML IV EMUL: INTRAVENOUS | Status: DC | PRN

## 2016-02-21 MED ORDER — LIDOCAINE-EPINEPHRINE 1 %-1:100000 IJ SOLN
INTRAMUSCULAR | Status: AC
  Filled 2016-02-21: qty 1

## 2016-02-21 MED ORDER — GLYCOPYRROLATE 0.2 MG/ML IJ SOLN
0.2000 mg | Freq: Once | INTRAMUSCULAR | Status: AC | PRN
  Administered 2016-02-21: 0.2 mg via INTRAVENOUS

## 2016-02-21 MED ORDER — ACETAMINOPHEN 650 MG RE SUPP: 650.0000 mg | RECTAL | Status: DC | PRN

## 2016-02-21 MED ORDER — CEFAZOLIN SODIUM-DEXTROSE 2-4 GM/100ML-% IV SOLN
2.0000 g | INTRAVENOUS | Status: AC
  Administered 2016-02-21: 2 g via INTRAVENOUS

## 2016-02-21 MED ORDER — LIDOCAINE HCL (PF) 1 % IJ SOLN
INTRAMUSCULAR | Status: DC | PRN
  Administered 2016-02-21: 2 mL

## 2016-02-21 MED ORDER — PROPOFOL 10 MG/ML IV BOLUS
INTRAVENOUS | Status: DC | PRN
  Administered 2016-02-21 (×2): 20 mg via INTRAVENOUS

## 2016-02-21 MED ORDER — LIDOCAINE HCL 2 % IJ SOLN
INTRAMUSCULAR | Status: AC
  Filled 2016-02-21: qty 20

## 2016-02-21 MED ORDER — SODIUM CHLORIDE 0.9% FLUSH: 3.0000 mL | INTRAVENOUS | Status: DC | PRN

## 2016-02-21 MED ORDER — PROMETHAZINE HCL 25 MG/ML IJ SOLN: 6.2500 mg | INTRAMUSCULAR | Status: DC | PRN

## 2016-02-21 MED ORDER — ONDANSETRON HCL 4 MG/2ML IJ SOLN
INTRAMUSCULAR | Status: AC
Start: 2016-02-21 — End: 2016-02-21
  Filled 2016-02-21: qty 2

## 2016-02-21 MED ORDER — LACTATED RINGERS IV SOLN
INTRAVENOUS | Status: DC
Start: 2016-02-21 — End: 2016-02-21
  Administered 2016-02-21: 09:00:00 via INTRAVENOUS

## 2016-02-21 MED ORDER — ACETAMINOPHEN 325 MG PO TABS: 650.0000 mg | ORAL_TABLET | ORAL | Status: DC | PRN

## 2016-02-21 MED ORDER — BUPIVACAINE-EPINEPHRINE (PF) 0.25% -1:200000 IJ SOLN
INTRAMUSCULAR | Status: DC | PRN
  Administered 2016-02-21: 2 mL

## 2016-02-21 MED ORDER — GLYCOPYRROLATE 0.2 MG/ML IV SOSY
PREFILLED_SYRINGE | INTRAVENOUS | Status: AC
  Filled 2016-02-21: qty 3

## 2016-02-21 MED ORDER — LIDOCAINE HCL (CARDIAC) 20 MG/ML IV SOLN
INTRAVENOUS | Status: DC | PRN
  Administered 2016-02-21: 20 mg via INTRAVENOUS

## 2016-02-21 MED ORDER — SCOPOLAMINE 1 MG/3DAYS TD PT72: 1.0000 | MEDICATED_PATCH | Freq: Once | TRANSDERMAL | Status: DC | PRN

## 2016-02-21 MED ORDER — LIDOCAINE HCL (PF) 0.5 % IJ SOLN
INTRAMUSCULAR | Status: AC
  Filled 2016-02-21: qty 50

## 2016-02-21 MED ORDER — CEFAZOLIN SODIUM-DEXTROSE 2-4 GM/100ML-% IV SOLN
INTRAVENOUS | Status: AC
  Filled 2016-02-21: qty 100

## 2016-02-21 MED ORDER — OXYCODONE HCL 5 MG PO TABS: 5.0000 mg | ORAL_TABLET | Freq: Four times a day (QID) | ORAL | 0 refills | Status: DC | PRN

## 2016-02-21 MED ORDER — MIDAZOLAM HCL 2 MG/2ML IJ SOLN: 1.0000 mg | INTRAMUSCULAR | Status: DC | PRN

## 2016-02-21 MED ORDER — SODIUM CHLORIDE 0.9 % IV SOLN: 250.0000 mL | INTRAVENOUS | Status: DC | PRN

## 2016-02-21 MED ORDER — OXYCODONE HCL 5 MG PO TABS: 5.0000 mg | ORAL_TABLET | ORAL | Status: DC | PRN

## 2016-02-21 SURGICAL SUPPLY — 37 items
BLADE HEX COATED 2.75 (ELECTRODE) ×3 IMPLANT
BLADE SURG 15 STRL LF DISP TIS (BLADE) ×1 IMPLANT
BLADE SURG 15 STRL SS (BLADE) ×2
CANISTER SUCT 1200ML W/VALVE (MISCELLANEOUS) IMPLANT
CHLORAPREP W/TINT 26ML (MISCELLANEOUS) ×3 IMPLANT
COVER BACK TABLE 60X90IN (DRAPES) ×3 IMPLANT
COVER MAYO STAND STRL (DRAPES) ×3 IMPLANT
DECANTER SPIKE VIAL GLASS SM (MISCELLANEOUS) IMPLANT
DRAPE LAPAROTOMY 100X72 PEDS (DRAPES) ×3 IMPLANT
DRAPE UTILITY XL STRL (DRAPES) ×3 IMPLANT
ELECT REM PT RETURN 9FT ADLT (ELECTROSURGICAL) ×3
ELECTRODE REM PT RTRN 9FT ADLT (ELECTROSURGICAL) ×1 IMPLANT
GLOVE BIO SURGEON STRL SZ 6 (GLOVE) ×3 IMPLANT
GLOVE BIO SURGEON STRL SZ7.5 (GLOVE) ×3 IMPLANT
GLOVE BIOGEL PI IND STRL 6.5 (GLOVE) ×1 IMPLANT
GLOVE BIOGEL PI IND STRL 7.0 (GLOVE) ×1 IMPLANT
GLOVE BIOGEL PI IND STRL 8 (GLOVE) ×1 IMPLANT
GLOVE BIOGEL PI INDICATOR 6.5 (GLOVE) ×2
GLOVE BIOGEL PI INDICATOR 7.0 (GLOVE) ×2
GLOVE BIOGEL PI INDICATOR 8 (GLOVE) ×2
GOWN STRL REUS W/ TWL LRG LVL3 (GOWN DISPOSABLE) ×1 IMPLANT
GOWN STRL REUS W/TWL 2XL LVL3 (GOWN DISPOSABLE) ×3 IMPLANT
GOWN STRL REUS W/TWL LRG LVL3 (GOWN DISPOSABLE) ×2
LIQUID BAND (GAUZE/BANDAGES/DRESSINGS) ×3 IMPLANT
NEEDLE HYPO 25X1 1.5 SAFETY (NEEDLE) ×3 IMPLANT
NS IRRIG 1000ML POUR BTL (IV SOLUTION) IMPLANT
PACK BASIN DAY SURGERY FS (CUSTOM PROCEDURE TRAY) ×3 IMPLANT
PENCIL BUTTON HOLSTER BLD 10FT (ELECTRODE) ×3 IMPLANT
SUT MNCRL AB 4-0 PS2 18 (SUTURE) ×3 IMPLANT
SUT VIC AB 3-0 SH 27 (SUTURE) ×2
SUT VIC AB 3-0 SH 27X BRD (SUTURE) ×1 IMPLANT
SYR CONTROL 10ML LL (SYRINGE) ×3 IMPLANT
TOWEL OR 17X24 6PK STRL BLUE (TOWEL DISPOSABLE) ×3 IMPLANT
TOWEL OR NON WOVEN STRL DISP B (DISPOSABLE) ×3 IMPLANT
TUBE CONNECTING 20'X1/4 (TUBING)
TUBE CONNECTING 20X1/4 (TUBING) IMPLANT
YANKAUER SUCT BULB TIP NO VENT (SUCTIONS) IMPLANT

## 2016-02-21 NOTE — Discharge Instructions (Addendum)
Post Anesthesia Home Care Instructions  Activity: Get plenty of rest for the remainder of the day. A responsible adult should stay with you for 24 hours following the procedure.  For the next 24 hours, DO NOT: -Drive a car -Paediatric nurse -Drink alcoholic beverages -Take any medication unless instructed by your physician -Make any legal decisions or sign important papers.  Meals: Start with liquid foods such as gelatin or soup. Progress to regular foods as tolerated. Avoid greasy, spicy, heavy foods. If nausea and/or vomiting occur, drink only clear liquids until the nausea and/or vomiting subsides. Call your physician if vomiting continues.  Special Instructions/Symptoms: Your throat may feel dry or sore from the anesthesia or the breathing tube placed in your throat during surgery. If this causes discomfort, gargle with warm salt water. The discomfort should disappear within 24 hours.  If you had a scopolamine patch placed behind your ear for the management of post- operative nausea and/or vomiting:  1. The medication in the patch is effective for 72 hours, after which it should be removed.  Wrap patch in a tissue and discard in the trash. Wash hands thoroughly with soap and water. 2. You may remove the patch earlier than 72 hours if you experience unpleasant side effects which may include dry mouth, dizziness or visual disturbances. 3. Avoid touching the patch. Wash your hands with soap and water after contact with the patch.     [place] Office Phone Number [phone]   POST OP INSTRUCTIONS  Always review your discharge instruction sheet given to you by the facility where your surgery was performed.  IF YOU HAVE DISABILITY OR FAMILY LEAVE FORMS, YOU MUST BRING THEM TO THE OFFICE FOR PROCESSING.  DO NOT GIVE THEM TO YOUR DOCTOR.  1. A prescription for pain medication may be given to you upon discharge.  Take your pain medication as prescribed, if needed.   If narcotic pain medicine is not needed, then you may take acetaminophen (Tylenol) or ibuprofen (Advil) as needed. 2. Take your usually prescribed medications unless otherwise directed 3. If you need a refill on your pain medication, please contact your pharmacy.  They will contact our office to request authorization.  Prescriptions will not be filled after 5pm or on week-ends. 4. You should eat very light the first 24 hours after surgery, such as soup, crackers, pudding, etc.  Resume your normal diet the day after surgery 5. It is common to experience some constipation if taking pain medication after surgery.  Increasing fluid intake and taking a stool softener will usually help or prevent this problem from occurring.  A mild laxative (Milk of Magnesia or Miralax) should be taken according to package directions if there are no bowel movements after 48 hours. 6. You may shower in 48 hours.  The surgical glue will flake off in 2-3 weeks.   7. ACTIVITIES:  No strenuous activity or heavy lifting for 1 week.   a. You may drive when you no longer are taking prescription pain medication, you can comfortably wear a seatbelt, and you can safely maneuver your car and apply brakes. b. RETURN TO WORK:  _______1 week_______________ Jorge Ross should see your doctor in the office for a follow-up appointment approximately three-four weeks after your surgery.    WHEN TO CALL YOUR DOCTOR: 1. Fever over 101.0 2. Nausea and/or vomiting. 3. Extreme swelling or bruising. 4. Continued bleeding from incision. 5. Increased pain, redness, or drainage from the incision.  The clinic staff is available  to answer your questions during regular business hours.  Please dont hesitate to call and ask to speak to one of the nurses for clinical concerns.  If you have a medical emergency, go to the nearest emergency room or call 911.  A surgeon from [facility] is always on call at the hospital.  For further questions,  please visit [email or web address]

## 2016-02-21 NOTE — Anesthesia Preprocedure Evaluation (Signed)
Anesthesia Evaluation  Patient identified by MRN, date of birth, ID band Patient awake and Patient unresponsive    Reviewed: Allergy & Precautions, NPO status , Patient's Chart, lab work & pertinent test results  Airway Mallampati: II  TM Distance: >3 FB Neck ROM: Full    Dental no notable dental hx.    Pulmonary neg pulmonary ROS,    Pulmonary exam normal breath sounds clear to auscultation       Cardiovascular hypertension, Pt. on medications Normal cardiovascular exam Rhythm:Regular Rate:Normal     Neuro/Psych negative neurological ROS  negative psych ROS   GI/Hepatic negative GI ROS, Neg liver ROS,   Endo/Other  negative endocrine ROS  Renal/GU Renal disease  negative genitourinary   Musculoskeletal  (+) Arthritis ,   Abdominal   Peds negative pediatric ROS (+)  Hematology negative hematology ROS (+)   Anesthesia Other Findings   Reproductive/Obstetrics negative OB ROS                             Anesthesia Physical Anesthesia Plan  ASA: II  Anesthesia Plan: MAC   Post-op Pain Management:    Induction: Intravenous  Airway Management Planned: Natural Airway  Additional Equipment:   Intra-op Plan:   Post-operative Plan:   Informed Consent: I have reviewed the patients History and Physical, chart, labs and discussed the procedure including the risks, benefits and alternatives for the proposed anesthesia with the patient or authorized representative who has indicated his/her understanding and acceptance.   Dental advisory given  Plan Discussed with: CRNA  Anesthesia Plan Comments:         Anesthesia Quick Evaluation

## 2016-02-21 NOTE — Op Note (Signed)
  PRE-OPERATIVE DIAGNOSIS:  un-needed Port-A-Cath for colon cancer  POST-OPERATIVE DIAGNOSIS:  Same   PROCEDURE:  Procedure(s):  REMOVAL PORT-A-CATH  SURGEON:  Surgeon(s):  Stark Klein, MD  ANESTHESIA:   MAC + local  EBL:   Minimal  SPECIMEN:  None  Complications : none known  Procedure:   Pt was  identified in the holding area and taken to the operating room where she was placed supine on the operating room table.  MAC anesthesia was induced.  The left upper chest was prepped and draped.  The prior incision was anesthetized with local anesthetic.  The incision was opened with a #15 blade.  The subcutaneous tissue was divided with the cautery.  The port was identified and the capsule opened.  The four 2-0 prolene sutures were removed.  The port was then removed and pressure held on the tract.  The catheter appeared intact without evidence of breakage, length was 24.5 cm.  The wound was inspected for hemostasis, which was achieved with cautery.  The wound was closed with 3-0 vicryl deep dermal interrupted sutures and 4-0 Monocryl running subcuticular suture.  The wound was cleaned, dried, and dressed with dermabond.  The patient was awakened from anesthesia and taken to the PACU in stable condition.  Needle, sponge, and instrument counts are correct.

## 2016-02-21 NOTE — Transfer of Care (Signed)
Immediate Anesthesia Transfer of Care Note  Patient: Jorge Ross  Procedure(s) Performed: Procedure(s): REMOVAL PORT-A-CATH (N/A)  Patient Location: PACU  Anesthesia Type:MAC  Level of Consciousness: awake, alert , oriented and patient cooperative  Airway & Oxygen Therapy: Patient Spontanous Breathing and Patient connected to face mask oxygen  Post-op Assessment: Report given to RN, Post -op Vital signs reviewed and stable and Patient moving all extremities  Post vital signs: Reviewed and stable  Last Vitals:  Vitals:   02/21/16 0853  BP: (!) 150/64  Pulse: 63  Resp: 20  Temp: 36.4 C    Last Pain:  Vitals:   02/21/16 0853  TempSrc: Oral         Complications: No apparent anesthesia complications

## 2016-02-21 NOTE — H&P (Signed)
Jorge Ross. Iannuzzi 01/29/2016 10:12 AM Location: [facility] Patient #: [id number] DOB: [birth year] Married / Language: English / Race: Black or African American Male   History of Present Illness Stark Klein MD; 01/29/2016 10:38 AM) Patient words: f/u partial colectomy.  The patient is a 75 year old male who presents with colorectal cancer. 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 Initial presentation was for fecal occult blood. Treatment has included colon resection (Robotic colectomy 04/01/2014). He also had a robotic partial left nephrectomy for a renal cell cancer by Dr. Tresa Moore on the same day. ]  Pt had a port a cath and received FOLFOX. He had neuropathy, which has improved significantly in his hands, but still is a problem in his feet. He denies blood in stool, change in bowel habits, change in stool caliber, and anemia. He has had some right calf cramps. He denies abdominal pain and bloating. He has not yet had a repeat colonoscopy since dx.    Allergies Patsey Berthold, CMA; 01/29/2016 10:13 AM) Codeine/Codeine Derivatives constipation  Medication History Patsey Berthold, CMA; 01/29/2016 10:13 AM) Avalide (150-12.5MG  Tablet, Oral) Active. Vitamin E Complex (Oral) Specific dose unknown - Active. Multiple Vitamins Essential (Oral) Active. Tylenol (325MG  Capsule, Oral as needed) Active. Aspirin (81MG  Tablet, Oral daily) Active. Gabapentin (300MG  Capsule, Oral) Active. Medications Reconciled    Review of Systems Stark Klein MD; 01/29/2016 10:38 AM) All other systems negative  Vitals Patsey Berthold CMA; 01/29/2016 10:14 AM) 01/29/2016 10:13 AM Weight: 186.4 lb Height: 72in Body Surface Area: 2.07 m Body Mass Index: 25.28 kg/m  Temp.: 98.44F  Pulse: 53 (Regular)  BP: 122/80 (Sitting, Left Arm, Standard)       Physical Exam Stark Klein  MD; 01/29/2016 10:38 AM) General Mental Status-Alert. General Appearance-Consistent with stated age. Hydration-Well hydrated. Voice-Normal.  Head and Neck Head-normocephalic, atraumatic with no lesions or palpable masses.  Eye Sclera/Conjunctiva - Bilateral-No scleral icterus.  Chest and Lung Exam Chest and lung exam reveals -quiet, even and easy respiratory effort with no use of accessory muscles. Inspection Chest Wall - Normal. Back - normal.  Breast - Did not examine.  Cardiovascular Cardiovascular examination reveals -normal pedal pulses bilaterally. Note: regular rate and rhythm  Abdomen Inspection-Inspection Normal. Palpation/Percussion Palpation and Percussion of the abdomen reveal - Soft, Non Tender, No Rebound tenderness, No Rigidity (guarding) and No hepatosplenomegaly. Note: no hernia at incisions.   Peripheral Vascular Upper Extremity Inspection - Bilateral - Normal - No Clubbing, No Cyanosis, No Edema, Pulses Intact. Lower Extremity Palpation - Edema - Bilateral - No edema.  Neurologic Neurologic evaluation reveals -alert and oriented x 3 with no impairment of recent or remote memory. Mental Status-Normal.  Musculoskeletal Global Assessment -Note: no gross deformities.  Normal Exam - Left-Upper Extremity Strength Normal and Lower Extremity Strength Normal. Normal Exam - Right-Upper Extremity Strength Normal and Lower Extremity Strength Normal.  Lymphatic Head & Neck  General Head & Neck Lymphatics: Bilateral - Description - Normal. Axillary  General Axillary Region: Bilateral - Description - Normal. Tenderness - Non Tender.    Assessment & Plan Stark Klein MD; 01/29/2016 10:39 AM) HISTORY OF COLON CANCER, STAGE III [id number]) Impression: Will plan port removal.  Have sent message to Dr. Havery Moros for colonoscopy.  No clinical evidence of disease. Current Plans Pt Education - [employer] Free Text Education/Instructions:  discussed with patient and provided information. You are being scheduled for surgery -  Our schedulers will call you.  You should hear from our office's scheduling department within 5 working days about the location, date, and time of surgery. We try to make accommodations for patient's preferences in scheduling surgery, but sometimes the OR schedule or the surgeon's schedule prevents [place] from making those accommodations.  If you have not heard from our office [phone]) in 5 working days, call the office and ask for your surgeon's nurse.  If you have other questions about your diagnosis, plan, or surgery, call the office and ask for your surgeon's nurse.    Signed by Stark Klein, MD (01/29/2016 10:39 AM)

## 2016-02-21 NOTE — Anesthesia Procedure Notes (Signed)
Procedure Name: MAC Date/Time: 02/21/2016 10:30 AM Performed by: Baxter Flattery Pre-anesthesia Checklist: Patient identified, Emergency Drugs available, Patient being monitored and Suction available Patient Re-evaluated:Patient Re-evaluated prior to inductionOxygen Delivery Method: Simple face mask Preoxygenation: Pre-oxygenation with 100% oxygen Intubation Type: IV induction Ventilation: Mask ventilation without difficulty Dental Injury: Teeth and Oropharynx as per pre-operative assessment

## 2016-02-21 NOTE — Anesthesia Postprocedure Evaluation (Signed)
Anesthesia Post Note  Patient: Jorge Ross  Procedure(s) Performed: Procedure(s) (LRB): REMOVAL PORT-A-CATH (N/A)  Patient location during evaluation: PACU Anesthesia Type: MAC Level of consciousness: awake and alert Pain management: pain level controlled Vital Signs Assessment: post-procedure vital signs reviewed and stable Respiratory status: spontaneous breathing, nonlabored ventilation, respiratory function stable and patient connected to nasal cannula oxygen Cardiovascular status: stable and blood pressure returned to baseline Anesthetic complications: no    Last Vitals:  Vitals:   02/21/16 1115 02/21/16 1151  BP: 122/76 (!) 142/73  Pulse: (!) 51 60  Resp: 14 16  Temp:  36.6 C    Last Pain:  Vitals:   02/21/16 1151  TempSrc:   PainSc: 0-No pain                 Bijan Ridgley J

## 2016-02-21 NOTE — Interval H&P Note (Signed)
History and Physical Interval Note:  02/21/2016 8:57 AM  Jorge Ross  has presented today for surgery, with the diagnosis of History of colon cancer  The various methods of treatment have been discussed with the patient and family. After consideration of risks, benefits and other options for treatment, the patient has consented to  Procedure(s): REMOVAL PORT-A-CATH (N/A) as a surgical intervention .  The patient's history has been reviewed, patient examined, no change in status, stable for surgery.  I have reviewed the patient's chart and labs.  Questions were answered to the patient's satisfaction.     Tonjua Rossetti

## 2016-02-26 ENCOUNTER — Encounter (HOSPITAL_BASED_OUTPATIENT_CLINIC_OR_DEPARTMENT_OTHER): Payer: Self-pay | Admitting: General Surgery

## 2016-03-08 ENCOUNTER — Encounter: Payer: Self-pay | Admitting: Gastroenterology

## 2016-03-08 ENCOUNTER — Ambulatory Visit (AMBULATORY_SURGERY_CENTER): Payer: Self-pay | Admitting: *Deleted

## 2016-03-08 VITALS — Ht 72.0 in | Wt 191.2 lb

## 2016-03-08 DIAGNOSIS — Z85038 Personal history of other malignant neoplasm of large intestine: Secondary | ICD-10-CM

## 2016-03-08 MED ORDER — NA SULFATE-K SULFATE-MG SULF 17.5-3.13-1.6 GM/177ML PO SOLN: 1.0000 | Freq: Once | ORAL | 0 refills | Status: AC

## 2016-03-08 NOTE — Progress Notes (Signed)
No allergies to eggs or soy. No problems with anesthesia.  Pt not given Emmi instructions for colonoscopy; no computer access  No oxygen use  No diet drug use  

## 2016-03-22 ENCOUNTER — Encounter: Payer: Self-pay | Admitting: Gastroenterology

## 2016-03-22 ENCOUNTER — Ambulatory Visit (AMBULATORY_SURGERY_CENTER): Payer: Medicare Other | Admitting: Gastroenterology

## 2016-03-22 VITALS — BP 138/73 | HR 64 | Temp 97.8°F | Resp 17 | Ht 72.0 in | Wt 191.0 lb

## 2016-03-22 DIAGNOSIS — D123 Benign neoplasm of transverse colon: Secondary | ICD-10-CM | POA: Diagnosis not present

## 2016-03-22 DIAGNOSIS — Z85038 Personal history of other malignant neoplasm of large intestine: Secondary | ICD-10-CM | POA: Diagnosis present

## 2016-03-22 MED ORDER — SODIUM CHLORIDE 0.9 % IV SOLN: 500.0000 mL | INTRAVENOUS | Status: DC

## 2016-03-22 NOTE — Op Note (Signed)
[place] Patient Name: Jorge Ross Procedure Date: 03/22/2016 9:19 AM MRN: [id number] Endoscopist: [place]. Loletha Carrow , MD Age: 75 Referring MD:  Date of Birth: 1941/02/28 Gender: Male Account #: [id number] Procedure:                Colonoscopy Indications:              High risk colon cancer surveillance: Personal                            history of colon cancer (T1N1Mx cecal cancer                            resected in 03/2014) Medicines:                Monitored Anesthesia Care Procedure:                Pre-Anesthesia Assessment:                           - Prior to the procedure, a History and Physical                            was performed, and patient medications and                            allergies were reviewed. The patient's tolerance of                            previous anesthesia was also reviewed. The risks                            and benefits of the procedure and the sedation                            options and risks were discussed with the patient.                            All questions were answered, and informed consent                            was obtained. Prior Anticoagulants: The patient has                            taken no previous anticoagulant or antiplatelet                            agents. ASA Grade Assessment: II - A patient with                            mild systemic disease. After reviewing the risks                            and benefits, the patient was deemed in  satisfactory condition to undergo the procedure.                           After obtaining informed consent, the colonoscope                            was passed under direct vision. Throughout the                            procedure, the patient's blood pressure, pulse, and                            oxygen saturations were monitored continuously. The                            Model CF-HQ190L [id number]) scope was introduced                             through the anus and advanced to the the                            neo-terminal ileum. The colonoscopy was performed                            without difficulty. The patient tolerated the                            procedure well. The quality of the bowel                            preparation was good. The rectum and the                            ileocolonic anatomosis were photographed. Scope In: 9:33:16 AM Scope Out: [id number] AM Scope Withdrawal Time: 0 hours 10 minutes 51 seconds  Total Procedure Duration: 0 hours 12 minutes 59 seconds  Findings:                 The perianal and digital rectal examinations were                            normal.                           There was evidence of a prior end-to-side                            ileo-colonic anastomosis in the proximal transverse                            colon. This was patent and was characterized by                            healthy appearing mucosa.  A 6 mm polyp was found in the distal transverse                            colon. The polyp was sessile. The polyp was removed                            with a cold snare. Resection and retrieval were                            complete.                           Internal hemorrhoids were found during                            retroflexion. The hemorrhoids were Grade I                            (internal hemorrhoids that do not prolapse).                           The exam was otherwise without abnormality. Complications:            No immediate complications. Estimated Blood Loss:     Estimated blood loss: none. Impression:               - Patent end-to-side ileo-colonic anastomosis,                            characterized by healthy appearing mucosa.                           - One 6 mm polyp in the distal transverse colon,                            removed with a cold snare. Resected and retrieved.                            - Internal hemorrhoids.                           - The examination was otherwise normal. Recommendation:           - Patient has a contact number available for                            emergencies. The signs and symptoms of potential                            delayed complications were discussed with the                            patient. Return to normal activities tomorrow.                            Written discharge instructions were provided  to the                            patient.                           - Resume previous diet.                           - Continue present medications.                           - Await pathology results.                           - Repeat colonoscopy in 3 years for surveillance. Deontrae Drinkard L. Loletha Carrow, MD 03/22/2016 9:54:36 AM This report has been signed electronically.

## 2016-03-22 NOTE — Progress Notes (Signed)
Called to room to assist during endoscopic procedure.  Patient ID and intended procedure confirmed with present staff. Received instructions for my participation in the procedure from the performing physician.  

## 2016-03-22 NOTE — Patient Instructions (Signed)
YOU HAD AN ENDOSCOPIC PROCEDURE TODAY AT [facility]:   Refer to the procedure report that was given to you for any specific questions about what was found during the examination.  If the procedure report does not answer your questions, please call your gastroenterologist to clarify.  If you requested that your care partner not be given the details of your procedure findings, then the procedure report has been included in a sealed envelope for you to review at your convenience later.  YOU SHOULD EXPECT: Some feelings of bloating in the abdomen. Passage of more gas than usual.  Walking can help get rid of the air that was put into your GI tract during the procedure and reduce the bloating. If you had a lower endoscopy (such as a colonoscopy or flexible sigmoidoscopy) you may notice spotting of blood in your stool or on the toilet paper. If you underwent a bowel prep for your procedure, you may not have a normal bowel movement for a few days.  Please Note:  You might notice some irritation and congestion in your nose or some drainage.  This is from the oxygen used during your procedure.  There is no need for concern and it should clear up in a day or so.  SYMPTOMS TO REPORT IMMEDIATELY:   Following lower endoscopy (colonoscopy or flexible sigmoidoscopy):  Excessive amounts of blood in the stool  Significant tenderness or worsening of abdominal pains  Swelling of the abdomen that is new, acute  Fever of 100F or higher   For urgent or emergent issues, a gastroenterologist can be reached at any hour by calling [phone].   DIET:  We do recommend a small meal at first, but then you may proceed to your regular diet.  Drink plenty of fluids but you should avoid alcoholic beverages for 24 hours.  ACTIVITY:  You should plan to take it easy for the rest of today and you should NOT DRIVE or use heavy machinery until tomorrow (because of the sedation medicines used during the test).     FOLLOW UP: Our staff will call the number listed on your records the next business day following your procedure to check on you and address any questions or concerns that you may have regarding the information given to you following your procedure. If we do not reach you, we will leave a message.  However, if you are feeling well and you are not experiencing any problems, there is no need to return our call.  We will assume that you have returned to your regular daily activities without incident.  If any biopsies were taken you will be contacted by phone or by letter within the next 1-3 weeks.  Please call us at [phone] if you have not heard about the biopsies in 3 weeks.    SIGNATURES/CONFIDENTIALITY: You and/or your care partner have signed paperwork which will be entered into your electronic medical record.  These signatures attest to the fact that that the information above on your After Visit Summary has been reviewed and is understood.  Full responsibility of the confidentiality of this discharge information lies with you and/or your care-partner.  Polyp, hemorrhoids-handout given  Repeat colonoscopy in 3 years.

## 2016-03-22 NOTE — Progress Notes (Signed)
Report to PACU, RN, vss, BBS= Clear.  

## 2016-03-25 ENCOUNTER — Telehealth: Payer: Self-pay

## 2016-03-25 NOTE — Telephone Encounter (Signed)
Left message on machine.

## 2016-03-27 ENCOUNTER — Encounter: Payer: Self-pay | Admitting: Gastroenterology

## 2016-07-04 ENCOUNTER — Telehealth: Payer: Self-pay | Admitting: Oncology

## 2016-07-04 ENCOUNTER — Other Ambulatory Visit (HOSPITAL_BASED_OUTPATIENT_CLINIC_OR_DEPARTMENT_OTHER): Payer: Medicare Other

## 2016-07-04 ENCOUNTER — Ambulatory Visit (HOSPITAL_BASED_OUTPATIENT_CLINIC_OR_DEPARTMENT_OTHER): Payer: Medicare Other | Admitting: Oncology

## 2016-07-04 VITALS — BP 133/57 | HR 50 | Temp 97.6°F | Resp 16 | Ht 72.0 in | Wt 191.4 lb

## 2016-07-04 DIAGNOSIS — Z85038 Personal history of other malignant neoplasm of large intestine: Secondary | ICD-10-CM | POA: Diagnosis not present

## 2016-07-04 DIAGNOSIS — Z85528 Personal history of other malignant neoplasm of kidney: Secondary | ICD-10-CM

## 2016-07-04 DIAGNOSIS — C189 Malignant neoplasm of colon, unspecified: Secondary | ICD-10-CM

## 2016-07-04 LAB — CBC WITH DIFFERENTIAL/PLATELET
BASO%: 0.2 % (ref 0.0–2.0)
BASOS ABS: 0 10*3/uL (ref 0.0–0.1)
EOS%: 0.8 % (ref 0.0–7.0)
Eosinophils Absolute: 0 10*3/uL (ref 0.0–0.5)
HEMATOCRIT: 39.7 % (ref 38.4–49.9)
HGB: 13.9 g/dL (ref 13.0–17.1)
LYMPH#: 1.8 10*3/uL (ref 0.9–3.3)
LYMPH%: 38.3 % (ref 14.0–49.0)
MCH: 32.6 pg (ref 27.2–33.4)
MCHC: 35 g/dL (ref 32.0–36.0)
MCV: 93.2 fL (ref 79.3–98.0)
MONO#: 0.6 10*3/uL (ref 0.1–0.9)
MONO%: 11.7 % (ref 0.0–14.0)
NEUT#: 2.3 10*3/uL (ref 1.5–6.5)
NEUT%: 49 % (ref 39.0–75.0)
Platelets: 147 10*3/uL (ref 140–400)
RBC: 4.26 10*6/uL (ref 4.20–5.82)
RDW: 13.2 % (ref 11.0–14.6)
WBC: 4.8 10*3/uL (ref 4.0–10.3)

## 2016-07-04 LAB — COMPREHENSIVE METABOLIC PANEL
ALT: 21 U/L (ref 0–55)
AST: 24 U/L (ref 5–34)
Albumin: 4.1 g/dL (ref 3.5–5.0)
Alkaline Phosphatase: 73 U/L (ref 40–150)
Anion Gap: 9 mEq/L (ref 3–11)
BUN: 21.8 mg/dL (ref 7.0–26.0)
CHLORIDE: 104 meq/L (ref 98–109)
CO2: 26 meq/L (ref 22–29)
CREATININE: 1 mg/dL (ref 0.7–1.3)
Calcium: 9.6 mg/dL (ref 8.4–10.4)
EGFR: 85 mL/min/{1.73_m2} — ABNORMAL LOW (ref >90)
GLUCOSE: 91 mg/dL (ref 70–140)
POTASSIUM: 3.9 meq/L (ref 3.5–5.1)
SODIUM: 140 meq/L (ref 136–145)
Total Bilirubin: 0.96 mg/dL (ref 0.20–1.20)
Total Protein: 7.6 g/dL (ref 6.4–8.3)

## 2016-07-04 LAB — CEA (IN HOUSE-CHCC): CEA (CHCC-In House): 2.38 ng/mL (ref 0.00–5.00)

## 2016-07-04 NOTE — Progress Notes (Signed)
Hematology and Oncology Follow Up Visit  Jorge Ross [id number] [birth year] 75 y.o. 07/04/2016 1:52 PM [name], MDClark, Don Broach, MD   Principle Diagnosis: 75 year old with colon cancer diagnosed in September 2015. He presented with a cecal mass and found to have a T1 N1 disease. He also has T1 N0 renal cell carcinoma diagnosed at around the same time.   Prior Therapy: He is status post right colectomy done laparoscopically on 04/01/2014. He had a T1 N1 disease was 0 out of 12 lymph nodes involved. He also status post partial nephrectomy on 04/01/2014 for a papillary tumor. FOLFOX adjuvant chemotherapy cycle 1 to given on 05/31/2014. Status post 11 cycles completed on 11/28/2014.  Current therapy: Observation and surveillance.  Interim History:  Mr. Kinsler presents today for a follow-up visit with his wife. Since the last visit, he reports doing well without any recent changes. 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 He does have lower extremity neuropathy as well but have been manageable.   He has not reported any nausea or vomiting. Bowels are moving regularly. He denied any hematochezia or melena. He did have a colonoscopy in August 2017. His performance status and activity level remain excellent.   He does not report any headaches, blurry vision, syncope. He does not report any chest pain, shortness of breath, cough. He does not report any lower extremity edema or palpitation. He does not report any nausea, vomiting, constipation, diarrhea or hematochezia. He does not report any frequency, urgency or hesitancy. He does not report any skeletal complaints. Remainder of his review of systems unremarkable.  Medications: I have reviewed the patient's current medications.  Current Outpatient Prescriptions  Medication Sig Dispense Refill  . aspirin 81 MG tablet Take 81 mg by mouth daily.      . irbesartan-hydrochlorothiazide (AVALIDE) 150-12.5 MG per tablet  Take 1 tablet by mouth every morning.     . Multiple Vitamin (MULTIVITAMIN) capsule Take 1 capsule by mouth daily.      . naproxen sodium (ANAPROX) 220 MG tablet Take 220 mg by mouth 2 (two) times daily with a meal.    . vitamin E 400 UNIT capsule Take 400 Units by mouth daily.      Current Facility-Administered Medications  Medication Dose Route Frequency Provider Last Rate Last Dose  . 0.9 %  sodium chloride infusion  500 mL Intravenous Continuous Nelida Meuse III, MD         Allergies:  Allergies  Allergen Reactions  . Codeine Other (See Comments)    constipation    Past Medical History, Surgical history, Social history, and Family History were reviewed and updated.   Physical Exam: Blood pressure (!) 143/57, pulse (!) 50, temperature 97.6 F (36.4 C), temperature source Oral, resp. rate 16, height 6' (1.829 m), weight 191 lb 6.4 oz (86.8 kg), SpO2 96 %. ECOG: 0 General appearance: Alert, awake gentleman without distress. Head: Normocephalic, without obvious abnormality no oral thrush noted. Neck: no adenopathy Lymph nodes: Cervical, supraclavicular, and axillary nodes normal. Heart:regular rate and rhythm, S1, S2 normal, no murmur, click, rub or gallop Lung:chest clear, no wheezing, rales, normal symmetric air entry.  Abdomin: soft, non-tender, without masses or organomegaly no shifting dullness or ascites. EXT:no erythema, induration, or nodules Skin: No rashes or lesions.  Lab Results: Lab Results  Component Value Date   WBC 4.8 07/04/2016   HGB 13.9 07/04/2016   HCT 39.7 07/04/2016   MCV 93.2 07/04/2016   PLT  147 07/04/2016     Chemistry      Component Value Date/Time   NA 139 02/20/2016 1150   NA 140 01/02/2016 1113   K 4.3 02/20/2016 1150   K 4.1 01/02/2016 1113   CL 105 02/20/2016 1150   CO2 28 02/20/2016 1150   CO2 28 01/02/2016 1113   BUN 17 02/20/2016 1150   BUN 20.0 01/02/2016 1113   CREATININE 0.96 02/20/2016 1150   CREATININE 1.0 01/02/2016  1113      Component Value Date/Time   CALCIUM 9.3 02/20/2016 1150   CALCIUM 9.4 01/02/2016 1113   ALKPHOS 61 01/02/2016 1113   AST 21 01/02/2016 1113   ALT 19 01/02/2016 1113   BILITOT 1.03 01/02/2016 1113       Impression and Plan:   75 year old gentleman with the following issues:  1. 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 He is status post laparoscopic right colectomy done on 04/01/2014. He has T1 N1 disease with 1 out of 12 lymph nodes involved.   He is  status post 11 cycles of adjuvant FOLFOX chemotherapy that was well-tolerated without any major complications completed in 11/2014.   CT scan on 01/02/2016 did not show any evidence of relapse disease.   His laboratory data and physical examination today did not reveal any recurrent disease. The plan is to continue with active surveillance every 6 months and CT scan annually. After 5 years of completing surveillance, no further imaging studies will be needed.  2. IV access: Port-A-Cath has been removed without complications.  3. Renal cell carcinoma status post resection for a T1 N0 disease. The histology showed papillary type. Repeat imaging studies will look for any cancer recurrence.  4. Colonoscopy surveillance: He completed last colonoscopy in August 2017 which showed a polyp. Repeat colonoscopy will be needed the next few years.  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 He is on Neurontin which of helps his symptoms.  6. Follow-up will be in 6 months and repeat imaging studies at that time.  [id number], MD 12/7/20171:52 PM

## 2016-07-04 NOTE — Telephone Encounter (Signed)
Appointments scheduled per 12/7 LOS. Patient given AVS report and calendars with future scheduled appointments.  °

## 2016-07-05 LAB — CEA (PARALLEL TESTING): CEA: 1.1 ng/mL

## 2016-07-13 ENCOUNTER — Other Ambulatory Visit: Payer: Self-pay | Admitting: Nurse Practitioner

## 2017-01-02 ENCOUNTER — Other Ambulatory Visit (HOSPITAL_BASED_OUTPATIENT_CLINIC_OR_DEPARTMENT_OTHER): Payer: Medicare Other

## 2017-01-02 ENCOUNTER — Ambulatory Visit (HOSPITAL_COMMUNITY)
Admission: RE | Admit: 2017-01-02 | Discharge: 2017-01-02 | Disposition: A | Discharge status: Home / Self Care | Payer: Medicare Other | Source: Ambulatory Visit | Attending: Oncology | Admitting: Oncology

## 2017-01-02 DIAGNOSIS — Z85038 Personal history of other malignant neoplasm of large intestine: Secondary | ICD-10-CM

## 2017-01-02 DIAGNOSIS — K225 Diverticulum of esophagus, acquired: Secondary | ICD-10-CM | POA: Insufficient documentation

## 2017-01-02 DIAGNOSIS — C189 Malignant neoplasm of colon, unspecified: Secondary | ICD-10-CM

## 2017-01-02 DIAGNOSIS — Z85528 Personal history of other malignant neoplasm of kidney: Secondary | ICD-10-CM

## 2017-01-02 LAB — COMPREHENSIVE METABOLIC PANEL
ALBUMIN: 4.1 g/dL (ref 3.5–5.0)
ALK PHOS: 65 U/L (ref 40–150)
ALT: 17 U/L (ref 0–55)
AST: 19 U/L (ref 5–34)
Anion Gap: 8 mEq/L (ref 3–11)
BILIRUBIN TOTAL: 0.75 mg/dL (ref 0.20–1.20)
BUN: 26.4 mg/dL — AB (ref 7.0–26.0)
CALCIUM: 9.7 mg/dL (ref 8.4–10.4)
CO2: 28 mEq/L (ref 22–29)
CREATININE: 1.1 mg/dL (ref 0.7–1.3)
Chloride: 104 mEq/L (ref 98–109)
EGFR: 74 mL/min/{1.73_m2} — ABNORMAL LOW (ref >90)
Glucose: 93 mg/dl (ref 70–140)
POTASSIUM: 4.2 meq/L (ref 3.5–5.1)
Sodium: 140 mEq/L (ref 136–145)
Total Protein: 7.3 g/dL (ref 6.4–8.3)

## 2017-01-02 LAB — CBC WITH DIFFERENTIAL/PLATELET
BASO%: 0.7 % (ref 0.0–2.0)
Basophils Absolute: 0 10*3/uL (ref 0.0–0.1)
EOS%: 1.1 % (ref 0.0–7.0)
Eosinophils Absolute: 0.1 10*3/uL (ref 0.0–0.5)
HCT: 41.1 % (ref 38.4–49.9)
HGB: 14 g/dL (ref 13.0–17.1)
LYMPH%: 37.2 % (ref 14.0–49.0)
MCH: 33.2 pg (ref 27.2–33.4)
MCHC: 34 g/dL (ref 32.0–36.0)
MCV: 97.7 fL (ref 79.3–98.0)
MONO#: 0.4 10*3/uL (ref 0.1–0.9)
MONO%: 9.1 % (ref 0.0–14.0)
NEUT#: 2.4 10*3/uL (ref 1.5–6.5)
NEUT%: 51.9 % (ref 39.0–75.0)
Platelets: 180 10*3/uL (ref 140–400)
RBC: 4.21 10*6/uL (ref 4.20–5.82)
RDW: 13.2 % (ref 11.0–14.6)
WBC: 4.7 10*3/uL (ref 4.0–10.3)
lymph#: 1.7 10*3/uL (ref 0.9–3.3)

## 2017-01-02 LAB — CEA (IN HOUSE-CHCC): CEA (CHCC-IN HOUSE): 2.01 ng/mL (ref 0.00–5.00)

## 2017-01-02 MED ORDER — IOPAMIDOL (ISOVUE-300) INJECTION 61%
INTRAVENOUS | Status: AC
  Filled 2017-01-02: qty 30

## 2017-01-02 MED ORDER — IOPAMIDOL (ISOVUE-300) INJECTION 61%
INTRAVENOUS | Status: AC
  Filled 2017-01-02: qty 100

## 2017-01-02 MED ORDER — IOPAMIDOL (ISOVUE-300) INJECTION 61%
30.0000 mL | Freq: Once | INTRAVENOUS | Status: AC | PRN
  Administered 2017-01-02: 30 mL via ORAL

## 2017-01-02 MED ORDER — IOPAMIDOL (ISOVUE-300) INJECTION 61%
100.0000 mL | Freq: Once | INTRAVENOUS | Status: AC | PRN
  Administered 2017-01-02: 100 mL via INTRAVENOUS

## 2017-01-03 ENCOUNTER — Telehealth: Payer: Self-pay | Admitting: *Deleted

## 2017-01-03 LAB — CEA (PARALLEL TESTING): CEA: 1.2 ng/mL

## 2017-01-03 NOTE — Telephone Encounter (Signed)
"  Calling in reference to the test results from yesterday."  Received voicemail.  Message left the CEA, CMET and cbc-diff are stable and WNL,

## 2017-01-06 ENCOUNTER — Telehealth: Payer: Self-pay | Admitting: *Deleted

## 2017-01-06 NOTE — Telephone Encounter (Signed)
He has an MD follow on 6/14 to discuss the results. Please let him the scan is normal.

## 2017-01-06 NOTE — Telephone Encounter (Signed)
Patient called and left a voice mail message stating,"could you please give me the CT scan results from last week? My return number is [phone]. Thank you."

## 2017-01-07 NOTE — Telephone Encounter (Signed)
Patient drove to [facility] to get results.

## 2017-01-09 ENCOUNTER — Ambulatory Visit (HOSPITAL_BASED_OUTPATIENT_CLINIC_OR_DEPARTMENT_OTHER): Payer: Medicare Other | Admitting: Oncology

## 2017-01-09 ENCOUNTER — Telehealth: Payer: Self-pay | Admitting: Oncology

## 2017-01-09 VITALS — BP 139/68 | HR 70 | Temp 97.7°F | Resp 17 | Ht 72.0 in | Wt 188.2 lb

## 2017-01-09 DIAGNOSIS — C189 Malignant neoplasm of colon, unspecified: Secondary | ICD-10-CM

## 2017-01-09 DIAGNOSIS — Z85528 Personal history of other malignant neoplasm of kidney: Secondary | ICD-10-CM | POA: Diagnosis not present

## 2017-01-09 DIAGNOSIS — C180 Malignant neoplasm of cecum: Secondary | ICD-10-CM

## 2017-01-09 DIAGNOSIS — G620 Drug-induced polyneuropathy: Secondary | ICD-10-CM

## 2017-01-09 DIAGNOSIS — C779 Secondary and unspecified malignant neoplasm of lymph node, unspecified: Secondary | ICD-10-CM

## 2017-01-09 NOTE — Progress Notes (Signed)
Hematology and Oncology Follow Up Visit  Jorge Ross [id number] 05-Nov-1940 76 y.o. 01/09/2017 1:18 PM Jorge Ross, MDClark, Jorge Broach, MD   Principle Diagnosis: 76 year old with colon cancer diagnosed in September 2015. He presented with a cecal mass and found to have a T1 N1 disease. He also has T1 N0 renal cell carcinoma diagnosed at around the same time.   Prior Therapy: He is status post right colectomy done laparoscopically on 04/01/2014. He had a T1 N1 disease was 0 out of 12 lymph nodes involved. He also status post partial nephrectomy on 04/01/2014 for a papillary tumor. FOLFOX adjuvant chemotherapy cycle 1 to given on 05/31/2014. Status post 11 cycles completed on 11/28/2014.  Current therapy: Observation and surveillance.  Interim History:  Jorge Ross presents today for a follow-up visit with his wife. Since the last visit, he reports reasonably well without any recent complaints. He feels tired the last few days as he is attending to the health of his wife was hospitalized briefly. 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 He does have lower extremity neuropathy which has not dramatically changed.   He has not reported any nausea or vomiting. Bowels are moving regularly. He denied any hematochezia or melena. He denied any decline in his performance status or activity level.   He does not report any headaches, blurry vision, syncope. He does not report any chest pain, shortness of breath, cough. He does not report any lower extremity edema or palpitation. He does not report any nausea, vomiting, constipation, diarrhea or hematochezia. He does not report any frequency, urgency or hesitancy. He does not report any skeletal complaints. Remainder of his review of systems unremarkable.  Medications: I have reviewed the patient's current medications.  Current Outpatient Prescriptions  Medication Sig Dispense Refill  . aspirin 81 MG tablet  Take 81 mg by mouth daily.      . irbesartan-hydrochlorothiazide (AVALIDE) 150-12.5 MG per tablet Take 1 tablet by mouth every morning.     . Multiple Vitamin (MULTIVITAMIN) capsule Take 1 capsule by mouth daily.      . naproxen sodium (ANAPROX) 220 MG tablet Take 220 mg by mouth 2 (two) times daily with a meal.    . vitamin E 400 UNIT capsule Take 400 Units by mouth daily.      Current Facility-Administered Medications  Medication Dose Route Frequency Provider Last Rate Last Dose  . 0.9 %  sodium chloride infusion  500 mL Intravenous Continuous Doran Stabler, MD         Allergies:  Allergies  Allergen Reactions  . Codeine Other (See Comments)    constipation    Past Medical History, Surgical history, Social history, and Family History were reviewed and updated.   Physical Exam: Blood pressure 139/68, pulse 70, temperature 97.7 F (36.5 C), temperature source Oral, resp. rate 17, height 6' (1.829 m), weight 188 lb 3.2 oz (85.4 kg), SpO2 98 %. ECOG: 0 General appearance: Well-appearing gentleman without distress. Head: Normocephalic, without obvious abnormality no oral ulcers or thrush. Neck: no adenopathy Lymph nodes: Cervical, supraclavicular, and axillary nodes normal. Heart:regular rate and rhythm, S1, S2 normal, no murmur, click, rub or gallop Lung:chest clear, no wheezing, rales, normal symmetric air entry.  Abdomin: soft, non-tender, without masses or organomegaly no rebound or guarding. EXT:no erythema, induration, or nodules Skin: No rashes or lesions.  Lab Results: Lab Results  Component Value Date   WBC 4.7 01/02/2017   HGB  14.0 01/02/2017   HCT 41.1 01/02/2017   MCV 97.7 01/02/2017   PLT 180 01/02/2017     Chemistry      Component Value Date/Time   NA 140 01/02/2017 0954   K 4.2 01/02/2017 0954   CL 105 02/20/2016 1150   CO2 28 01/02/2017 0954   BUN 26.4 (H) 01/02/2017 0954   CREATININE 1.1 01/02/2017 0954      Component Value Date/Time    CALCIUM 9.7 01/02/2017 0954   ALKPHOS 65 01/02/2017 0954   AST 19 01/02/2017 0954   ALT 17 01/02/2017 0954   BILITOT 0.75 01/02/2017 0954     EXAM: CT CHEST, ABDOMEN, AND PELVIS WITH CONTRAST  TECHNIQUE: Multidetector CT imaging of the chest, abdomen and pelvis was performed following the standard protocol during bolus administration of intravenous contrast.  CONTRAST:  176mL ISOVUE-300 IOPAMIDOL (ISOVUE-300) INJECTION 61%  COMPARISON:  01/02/2016  FINDINGS: CT CHEST FINDINGS  Cardiovascular: No acute findings. 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 Azygos fissure also noted  Mediastinum/Lymph Nodes: No masses or pathologically enlarged lymph nodes identified.  Lungs/Pleura: No pulmonary infiltrate or mass identified. No effusion present.  Musculoskeletal:  No suspicious bone lesions identified.  CT ABDOMEN AND PELVIS FINDINGS  Hepatobiliary: No masses identified. Gallbladder is unremarkable.  Pancreas:  No mass or inflammatory changes.  Spleen:  Within normal limits in size and appearance.  Adrenals/Urinary tract: No masses or hydronephrosis. Stable small bilateral renal cysts and chronic perinephric stranding. Unremarkable unopacified urinary bladder.  Stomach/Bowel: No evidence of obstruction, inflammatory process, or abnormal fluid collections. Stable postop changes from previous right colectomy.  Vascular/Lymphatic: No pathologically enlarged lymph nodes identified. No abdominal aortic aneurysm.  Reproductive:  No mass or other significant abnormality identified.  Other:  None.  Musculoskeletal:  No suspicious bone lesions identified.  IMPRESSION: Stable exam. No evidence of recurrent or metastatic carcinoma. No other acute findings identified.  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   Impression and Plan:   76 year old gentleman with the  following issues:  1. 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 He is status post laparoscopic right colectomy done on 04/01/2014. He has T1 N1 disease with 1 out of 12 lymph nodes involved.   He is  status post 11 cycles of adjuvant FOLFOX chemotherapy that was well-tolerated without any major complications completed in 11/2014.   CT scan on 01/02/2017 was personally reviewed and showed no evidence of metastatic disease. His laboratory data including tumor markers are all within normal range.  The plan is to continue with active surveillance and repeat imaging studies in 12 months. His last scans will be done in 2020 and no further oncology follow-up will be needed after that.  2. IV access: Port-A-Cath has been removed without complications.  3. Renal cell carcinoma status post resection for a T1 N0 disease. The histology showed papillary type. Repeat imaging studies will look for any cancer recurrence.  4. Colonoscopy surveillance: He completed last colonoscopy in August 2017 which showed a polyp. Repeat colonoscopy will be 2 years.  5. Peripheral neuropathy: Very limited at this time and not interfering with function.  6. Follow-up will be in 12 months for repeat imaging studies.  [facility], MD 6/14/20181:18 PM

## 2017-01-09 NOTE — Telephone Encounter (Signed)
Appointments scheduled per 01/09/17 los. Patient ws given 2 bottles of Oral Contrast with a copy of the instructions, per CT orders. Patient was given a copy of the AVS report and appointment schedule, per 01/09/17 los.

## 2017-02-05 ENCOUNTER — Ambulatory Visit (HOSPITAL_COMMUNITY)
Admission: RE | Admit: 2017-02-05 | Discharge: 2017-02-05 | Disposition: A | Discharge status: Home / Self Care | Payer: Medicare Other | Source: Ambulatory Visit | Attending: Internal Medicine | Admitting: Internal Medicine

## 2017-02-05 ENCOUNTER — Ambulatory Visit (HOSPITAL_BASED_OUTPATIENT_CLINIC_OR_DEPARTMENT_OTHER)
Admission: RE | Admit: 2017-02-05 | Discharge: 2017-02-05 | Disposition: A | Discharge status: Home / Self Care | Payer: Medicare Other | Source: Ambulatory Visit

## 2017-02-05 ENCOUNTER — Other Ambulatory Visit: Payer: Self-pay | Admitting: Internal Medicine

## 2017-02-05 ENCOUNTER — Emergency Department (HOSPITAL_COMMUNITY)
Admission: EM | Admit: 2017-02-05 | Discharge: 2017-02-05 | Disposition: A | Discharge status: Home / Self Care | Payer: Medicare Other | Attending: Emergency Medicine | Admitting: Emergency Medicine

## 2017-02-05 ENCOUNTER — Encounter (HOSPITAL_COMMUNITY): Payer: Self-pay | Admitting: Emergency Medicine

## 2017-02-05 DIAGNOSIS — Z7982 Long term (current) use of aspirin: Secondary | ICD-10-CM | POA: Diagnosis not present

## 2017-02-05 DIAGNOSIS — Y9339 Activity, other involving climbing, rappelling and jumping off: Secondary | ICD-10-CM

## 2017-02-05 DIAGNOSIS — N189 Chronic kidney disease, unspecified: Secondary | ICD-10-CM | POA: Insufficient documentation

## 2017-02-05 DIAGNOSIS — I129 Hypertensive chronic kidney disease with stage 1 through stage 4 chronic kidney disease, or unspecified chronic kidney disease: Secondary | ICD-10-CM | POA: Diagnosis not present

## 2017-02-05 DIAGNOSIS — G8929 Other chronic pain: Secondary | ICD-10-CM

## 2017-02-05 DIAGNOSIS — Y9389 Activity, other specified: Secondary | ICD-10-CM | POA: Insufficient documentation

## 2017-02-05 DIAGNOSIS — Y929 Unspecified place or not applicable: Secondary | ICD-10-CM | POA: Diagnosis not present

## 2017-02-05 DIAGNOSIS — M25571 Pain in right ankle and joints of right foot: Secondary | ICD-10-CM

## 2017-02-05 DIAGNOSIS — Z85528 Personal history of other malignant neoplasm of kidney: Secondary | ICD-10-CM | POA: Diagnosis not present

## 2017-02-05 DIAGNOSIS — S8991XA Unspecified injury of right lower leg, initial encounter: Secondary | ICD-10-CM

## 2017-02-05 DIAGNOSIS — Z85038 Personal history of other malignant neoplasm of large intestine: Secondary | ICD-10-CM | POA: Insufficient documentation

## 2017-02-05 DIAGNOSIS — T1490XA Injury, unspecified, initial encounter: Secondary | ICD-10-CM

## 2017-02-05 DIAGNOSIS — M7989 Other specified soft tissue disorders: Principal | ICD-10-CM

## 2017-02-05 DIAGNOSIS — M79604 Pain in right leg: Secondary | ICD-10-CM

## 2017-02-05 DIAGNOSIS — W11XXXS Fall on and from ladder, sequela: Secondary | ICD-10-CM | POA: Insufficient documentation

## 2017-02-05 DIAGNOSIS — W132XXA Fall from, out of or through roof, initial encounter: Secondary | ICD-10-CM | POA: Insufficient documentation

## 2017-02-05 DIAGNOSIS — M79605 Pain in left leg: Secondary | ICD-10-CM

## 2017-02-05 DIAGNOSIS — M19071 Primary osteoarthritis, right ankle and foot: Secondary | ICD-10-CM | POA: Insufficient documentation

## 2017-02-05 DIAGNOSIS — Y998 Other external cause status: Secondary | ICD-10-CM | POA: Insufficient documentation

## 2017-02-05 DIAGNOSIS — S82434A Nondisplaced oblique fracture of shaft of right fibula, initial encounter for closed fracture: Secondary | ICD-10-CM

## 2017-02-05 DIAGNOSIS — S82831S Other fracture of upper and lower end of right fibula, sequela: Secondary | ICD-10-CM | POA: Diagnosis not present

## 2017-02-05 DIAGNOSIS — X58XXXA Exposure to other specified factors, initial encounter: Secondary | ICD-10-CM

## 2017-02-05 DIAGNOSIS — M79671 Pain in right foot: Secondary | ICD-10-CM

## 2017-02-05 DIAGNOSIS — Z79899 Other long term (current) drug therapy: Secondary | ICD-10-CM | POA: Diagnosis not present

## 2017-02-05 MED ORDER — IBUPROFEN 200 MG PO TABS
600.0000 mg | ORAL_TABLET | Freq: Once | ORAL | Status: AC
  Administered 2017-02-05: 21:00:00 600 mg via ORAL
  Filled 2017-02-05: qty 1

## 2017-02-05 NOTE — Discharge Instructions (Signed)
Please read instructions below. Apply ice to your ankle for 20 minutes at a time. Elevate your ankle whenever possible. Avoid weight bearing on your ankle until cleared by an orthopedic specialist. You can take tylenol every 6 hours as needed for pain. Schedule an appointment with your orthopedic specialist or Dr. Percell Miller within 1 week for follow-up on your injury. Return to the ER for new or concerning symptoms.

## 2017-02-05 NOTE — Progress Notes (Signed)
**  Preliminary report by tech**  Right lower extremity venous duplex complete. There is no obvious evidence of deep or superficial vein thrombosis involving the right lower extremity. All clearly visualized vessels appear patent and compressible. There is no evidence of a Baker's cyst on the right. Results were given to Dr. Carlis Abbott.  02/05/17 3:30 PM Jorge Ross RVT

## 2017-02-05 NOTE — ED Triage Notes (Signed)
Pt sent by Dr. Carlis Abbott for nondisplaced distal RIGHT fibular fracture.  Pt states he was on ladder last Wednesday and it started to fall and he jumped back.  Reports pain and swelling to R ankle.  Ambulatory to triage. PT's father passed away and he has been out of town.  Sent to hospital today for outpatient x-rays.  Pt went home and was called to come back by Dr. Carlis Abbott.

## 2017-02-05 NOTE — Progress Notes (Signed)
Orthopedic Tech Progress Note Patient Details:  Jorge Ross 1941/03/26 [id number]  Ortho Devices Type of Ortho Device: Crutches, Stirrup splint, Post (short leg) splint Ortho Device/Splint Location: rle Ortho Device/Splint Interventions: Ordered, Application, Adjustment   Karolee Stamps 02/05/2017, 10:06 PM

## 2017-02-05 NOTE — ED Provider Notes (Signed)
[facility] DEPT Provider Note   CSN: [id number] Arrival date & time: 02/05/17  1826  By signing my name below, I, [name], attest that this documentation has been prepared under the direction and in the presence of non-physician practitioner, Russo, [place], PA-C. Electronically Signed: [name], ED Scribe. 02/05/17. 9:18 PM.  History   Chief Complaint Chief Complaint  Patient presents with  . Ankle Pain   The history is provided by the patient. No language interpreter was used.   HPI Comments: RISHAAN Ross is a 76 y.o. male with a PMHx of CKD, HTN, renal CA, who presents to the Emergency Department complaining of sudden onset, moderate right ankle pain onset 1 week ago. Pt was on a ladder when it fell, causing him to fall. He states he landed on his feet, injuring his R ankle. No LOC or head injury. Pt was seen by his PCP today, where he got an XRF and DVT study. XR revealed a nondisplaced fracture to distal fibula. Per chart review, venous U/S of RLE without evidence of DVT or superficial thrombosis. Pt states pain is exacerbated by ambulation and movement. He states he has been walking around on it since the injury. Pt reports associated swelling to the ankle. Pt has tried Aleve and icing for pain with moderate relief of his pain. No other complaints at this time. He is not on anticoagulation.  Past Medical History:  Diagnosis Date  . Arthritis    HIPS  . Chronic kidney disease   . Colon cancer ([place]) 2015  . Hypertension   . 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 Unspecified disorder of intestine     Patient Active Problem List   Diagnosis Date Noted  . Port catheter in place 01/02/2016  . Colon cancer ([place]) 05/11/2014  . Left kidney mass s/p partial nephrectomy 04/02/2014  . 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 Occult blood in stools 02/10/2014  . Hypertension 12/04/2010    Past Surgical History:  Procedure Laterality Date  .  CERVICAL SPINE SURGERY     3 titamum screws  . COLECTOMY  03-2014  . COLON SURGERY  04/01/14  . COLONOSCOPY  2007   normal   . CYSTOSCOPY     03-15-16  . HERNIA REPAIR Bilateral    x 3 total   . PORT-A-CATH REMOVAL N/A 02/21/2016   Procedure: REMOVAL PORT-A-CATH;  Surgeon: Stark Klein, MD;  Location: [place] [facility];  Service: General;  Laterality: N/A;  . PORTACATH PLACEMENT N/A 05/24/2014   Procedure: INSERTION PORT-A-CATH;  Surgeon: Stark Klein, MD;  Location: [facility];  Service: General;  Laterality: N/A;  . ROBOT ASSISTED LAPAROSCOPIC NEPHRECTOMY Left 04/01/2014   Procedure: ROBOTIC ASSISTED LAPAROSCOPIC LEFT PARTIAL NEPHRECTOMY;  Surgeon: Alexis Frock, MD;  Location: [facility];  Service: Urology;  Laterality: Left;  . ROTATOR CUFF REPAIR Left   . thumb ligament surgery Right        Home Medications    Prior to Admission medications   Medication Sig Start Date End Date Taking? Authorizing Provider  aspirin 81 MG tablet Take 81 mg by mouth daily.      [provider]  irbesartan-hydrochlorothiazide (AVALIDE) 150-12.5 MG per tablet Take 1 tablet by mouth every morning.  01/25/14   [provider]  Multiple Vitamin (MULTIVITAMIN) capsule Take 1 capsule by mouth daily.      [provider]  naproxen sodium (ANAPROX) 220 MG tablet Take 220 mg by mouth 2 (  two) times daily with a meal.    [provider]  vitamin E 400 UNIT capsule Take 400 Units by mouth daily.     [provider]    Family History Family History  Problem Relation Age of Onset  . Prostate cancer Paternal Uncle   . Lung cancer Paternal Uncle        smoker  . Colon cancer Neg Hx   . Rectal cancer Neg Hx   . Esophageal cancer Neg Hx   . Stomach cancer Neg Hx     Social History Social History  Substance Use Topics  . Smoking status: Never Smoker  . Smokeless tobacco: Never Used  . Alcohol use Yes     Comment: rare     Allergies   Codeine   Review of  Systems Review of Systems  Musculoskeletal: Positive for arthralgias, joint swelling and myalgias.  Skin: Negative for color change and wound.  Neurological: Negative for syncope and headaches.     Physical Exam Updated Vital Signs BP (!) 152/85   Pulse 66   Temp 97.9 F (36.6 C) (Oral)   Resp 16   SpO2 100%   Physical Exam  Constitutional: He appears well-developed and well-nourished. No distress.  HENT:  Head: Normocephalic and atraumatic.  Eyes: Conjunctivae are normal.  Cardiovascular: Normal rate and intact distal pulses.   Pulmonary/Chest: Effort normal.  Musculoskeletal:  Right lower leg with significant edema from calf to distal foot. TTP over the lateral malleolus. Active dorsiflexion and plantar flexion of ankle, though with pain. No gross deformities, no ecchymosis.  Neurological: He is alert.  Normal sensation to right foot.   Skin: Skin is warm. No ecchymosis noted.  Psychiatric: He has a normal mood and affect. His behavior is normal.  Nursing note and vitals reviewed.    ED Treatments / Results  DIAGNOSTIC STUDIES: Oxygen Saturation is 96% on RA, normal by my interpretation.   COORDINATION OF CARE: 9:07 PM-Discussed next steps with pt including splinting the area and following up with ortho. Pt verbalized understanding and is agreeable with the plan.   Labs (all labs ordered are listed, but only abnormal results are displayed) Labs Reviewed - No data to display  EKG  EKG Interpretation None       Radiology Dg Ankle Complete Right  Result Date: 02/05/2017 CLINICAL DATA:  Lateral LEFT ankle pain, jumped off a roof last Wednesday after the ladder fell, pain and swelling are progressive EXAM: RIGHT ANKLE - COMPLETE 3+ VIEW COMPARISON:  None FINDINGS: Soft tissue swelling diffusely. Osseous mineralization normal. Ankle joint space preserved. Oblique nondisplaced fracture of the distal RIGHT fibula. Tiny spur at tip of medial malleolus. No additional  fracture, dislocation, or bone destruction. IMPRESSION: Oblique nondisplaced distal RIGHT fibular fracture. Electronically Signed   By: Lavonia Dana M.D.   On: 02/05/2017 16:57   Dg Foot Complete Right  Result Date: 02/05/2017 CLINICAL DATA:  Lateral RIGHT ankle pain, jumped off a roof last Wednesday after the ladder fell, pain and swelling are progressive EXAM: RIGHT FOOT COMPLETE - 3+ VIEW COMPARISON:  None. FINDINGS: Osseous mineralization normal. Joint spaces preserved. Dorsal soft tissue swelling at midfoot. Degenerative changes at first TMT joint. No acute fracture, dislocation, or bone destruction. IMPRESSION: No acute osseous abnormalities. Mild degenerative changes first TMT joint. Please see report of RIGHT ankle radiographs. Electronically Signed   By: Lavonia Dana M.D.   On: 02/05/2017 16:54    Procedures Procedures (including critical care time)  Medications Ordered in ED Medications  ibuprofen (ADVIL,MOTRIN) tablet 600 mg (600 mg Oral Given 02/05/17 2121)     Initial Impression / Assessment and Plan / ED Course  I have reviewed the triage vital signs and the nursing notes.  Pertinent labs & imaging results that were available during my care of the patient were reviewed by me and considered in my medical decision making (see chart for details).     Patient X-Ray with closed nondisplaced right distal fibula oblique fracture. NV intact. Pain managed in ED. Short leg splint with stirrup placed. Patient given crutches. Pt advised to follow up with orthopedics for further evaluation and treatment. Conservative therapy recommended and discussed. Patient will be dc home & is agreeable with above plan.  Patient discussed with and seen by Dr. Rogene Houston.  Discussed results, findings, treatment and follow up. Patient advised of return precautions. Patient verbalized understanding and agreed with plan.   Final Clinical Impressions(s) / ED Diagnoses   Final diagnoses:  Closed traumatic  nondisplaced fracture of distal end of right fibula, sequela    New Prescriptions New Prescriptions   No medications on file   I personally performed the services described in this documentation, which was scribed in my presence. The recorded information has been reviewed and is accurate.     Russo, [place] N, PA-C 02/05/17 2217    Fredia Sorrow, MD 02/07/17 [id number]

## 2017-02-05 NOTE — ED Provider Notes (Signed)
Medical screening examination/treatment/procedure(s) were conducted as a shared visit with non-physician practitioner(s) and myself.  I personally evaluated the patient during the encounter.   EKG Interpretation None       Results for orders placed or performed in visit on 01/02/17  CBC with Differential/Platelet  Result Value Ref Range   WBC 4.7 4.0 - 10.3 10e3/uL   NEUT# 2.4 1.5 - 6.5 10e3/uL   HGB 14.0 13.0 - 17.1 g/dL   HCT 41.1 38.4 - 49.9 %   Platelets 180 140 - 400 10e3/uL   MCV 97.7 79.3 - 98.0 fL   MCH 33.2 27.2 - 33.4 pg   MCHC 34.0 32.0 - 36.0 g/dL   RBC 4.21 4.20 - 5.82 10e6/uL   RDW 13.2 11.0 - 14.6 %   lymph# 1.7 0.9 - 3.3 10e3/uL   MONO# 0.4 0.1 - 0.9 10e3/uL   Eosinophils Absolute 0.1 0.0 - 0.5 10e3/uL   Basophils Absolute 0.0 0.0 - 0.1 10e3/uL   NEUT% 51.9 39.0 - 75.0 %   LYMPH% 37.2 14.0 - 49.0 %   MONO% 9.1 0.0 - 14.0 %   EOS% 1.1 0.0 - 7.0 %   BASO% 0.7 0.0 - 2.0 %  Comprehensive metabolic panel  Result Value Ref Range   Sodium 140 136 - 145 mEq/L   Potassium 4.2 3.5 - 5.1 mEq/L   Chloride 104 98 - 109 mEq/L   CO2 28 22 - 29 mEq/L   Glucose 93 70 - 140 mg/dl   BUN 26.4 (H) 7.0 - 26.0 mg/dL   Creatinine 1.1 0.7 - 1.3 mg/dL   Total Bilirubin 0.75 0.20 - 1.20 mg/dL   Alkaline Phosphatase 65 40 - 150 U/L   AST 19 5 - 34 U/L   ALT 17 0 - 55 U/L   Total Protein 7.3 6.4 - 8.3 g/dL   Albumin 4.1 3.5 - 5.0 g/dL   Calcium 9.7 8.4 - 10.4 mg/dL   Anion Gap 8 3 - 11 mEq/L   EGFR 74 (L) >90 ml/min/1.73 m2  CEA (IN HOUSE-[facility])  Result Value Ref Range   CEA ([facility]-In House) 2.01 0.00 - 5.00 ng/mL  CEA (Parallel Testing)  Result Value Ref Range   CEA 1.2 ng/mL   Dg Ankle Complete Right  Result Date: 02/05/2017 CLINICAL DATA:  Lateral LEFT ankle pain, jumped off a roof last Wednesday after the ladder fell, pain and swelling are progressive EXAM: RIGHT ANKLE - COMPLETE 3+ VIEW COMPARISON:  None FINDINGS: Soft tissue swelling diffusely. Osseous mineralization  normal. Ankle joint space preserved. Oblique nondisplaced fracture of the distal RIGHT fibula. Tiny spur at tip of medial malleolus. No additional fracture, dislocation, or bone destruction. IMPRESSION: Oblique nondisplaced distal RIGHT fibular fracture. Electronically Signed   By: Lavonia Dana M.D.   On: 02/05/2017 16:57   Dg Foot Complete Right  Result Date: 02/05/2017 CLINICAL DATA:  Lateral RIGHT ankle pain, jumped off a roof last Wednesday after the ladder fell, pain and swelling are progressive EXAM: RIGHT FOOT COMPLETE - 3+ VIEW COMPARISON:  None. FINDINGS: Osseous mineralization normal. Joint spaces preserved. Dorsal soft tissue swelling at midfoot. Degenerative changes at first TMT joint. No acute fracture, dislocation, or bone destruction. IMPRESSION: No acute osseous abnormalities. Mild degenerative changes first TMT joint. Please see report of RIGHT ankle radiographs. Electronically Signed   By: Lavonia Dana M.D.   On: 02/05/2017 16:54     The patient fell off a ladder about a week ago. Had some mild right ankle pain  and leg swelling since that time. Patient with [facility] primary. Today they were concerned about possible fracture injury x-rays were done which did show a nondisplaced [place] distal right fibula fracture. Patient will be treated with a stirrup type splint here crutches and follow-up with orthopedics. Patient has been seen by Dr. French Ana in the past. Patient told that he needs elevated as much as possible.  No other injuries from the fall. Patient has swelling to both legs but the right leg is swollen more. Refill his less than 2 seconds good sensation and movement of the toes. No proximal fibular tenderness. No other injuries.   Fredia Sorrow, MD 02/05/17 2125

## 2017-06-22 ENCOUNTER — Encounter (HOSPITAL_COMMUNITY): Payer: Self-pay | Admitting: Emergency Medicine

## 2017-06-22 ENCOUNTER — Observation Stay (HOSPITAL_COMMUNITY)
Admission: EM | Admit: 2017-06-22 | Discharge: 2017-06-24 | Disposition: A | Discharge status: Home / Self Care | Payer: Medicare Other | Attending: Cardiovascular Disease | Admitting: Cardiovascular Disease

## 2017-06-22 ENCOUNTER — Emergency Department (HOSPITAL_COMMUNITY): Payer: Medicare Other

## 2017-06-22 DIAGNOSIS — R55 Syncope and collapse: Secondary | ICD-10-CM | POA: Insufficient documentation

## 2017-06-22 DIAGNOSIS — N179 Acute kidney failure, unspecified: Secondary | ICD-10-CM | POA: Insufficient documentation

## 2017-06-22 DIAGNOSIS — I495 Sick sinus syndrome: Principal | ICD-10-CM | POA: Insufficient documentation

## 2017-06-22 DIAGNOSIS — Z905 Acquired absence of kidney: Secondary | ICD-10-CM | POA: Diagnosis not present

## 2017-06-22 DIAGNOSIS — E872 Acidosis, unspecified: Secondary | ICD-10-CM

## 2017-06-22 DIAGNOSIS — I447 Left bundle-branch block, unspecified: Secondary | ICD-10-CM | POA: Insufficient documentation

## 2017-06-22 DIAGNOSIS — M160 Bilateral primary osteoarthritis of hip: Secondary | ICD-10-CM | POA: Insufficient documentation

## 2017-06-22 DIAGNOSIS — N189 Chronic kidney disease, unspecified: Secondary | ICD-10-CM | POA: Diagnosis not present

## 2017-06-22 DIAGNOSIS — Z85038 Personal history of other malignant neoplasm of large intestine: Secondary | ICD-10-CM

## 2017-06-22 DIAGNOSIS — R001 Bradycardia, unspecified: Secondary | ICD-10-CM | POA: Diagnosis present

## 2017-06-22 DIAGNOSIS — Z959 Presence of cardiac and vascular implant and graft, unspecified: Secondary | ICD-10-CM

## 2017-06-22 DIAGNOSIS — I959 Hypotension, unspecified: Secondary | ICD-10-CM | POA: Insufficient documentation

## 2017-06-22 DIAGNOSIS — I129 Hypertensive chronic kidney disease with stage 1 through stage 4 chronic kidney disease, or unspecified chronic kidney disease: Secondary | ICD-10-CM | POA: Insufficient documentation

## 2017-06-22 DIAGNOSIS — R0602 Shortness of breath: Secondary | ICD-10-CM | POA: Insufficient documentation

## 2017-06-22 DIAGNOSIS — Z7982 Long term (current) use of aspirin: Secondary | ICD-10-CM | POA: Diagnosis not present

## 2017-06-22 LAB — COMPREHENSIVE METABOLIC PANEL
ALT: 116 U/L — ABNORMAL HIGH (ref 17–63)
ANION GAP: 10 (ref 5–15)
AST: 131 U/L — ABNORMAL HIGH (ref 15–41)
Albumin: 3.8 g/dL (ref 3.5–5.0)
Alkaline Phosphatase: 71 U/L (ref 38–126)
BUN: 29 mg/dL — ABNORMAL HIGH (ref 6–20)
CHLORIDE: 102 mmol/L (ref 101–111)
CO2: 23 mmol/L (ref 22–32)
Calcium: 8.4 mg/dL — ABNORMAL LOW (ref 8.9–10.3)
Creatinine, Ser: 1.33 mg/dL — ABNORMAL HIGH (ref 0.61–1.24)
GFR calc Af Amer: 59 mL/min — ABNORMAL LOW (ref >60)
GFR, EST NON AFRICAN AMERICAN: 51 mL/min — AB (ref >60)
Glucose, Bld: 173 mg/dL — ABNORMAL HIGH (ref 65–99)
POTASSIUM: 3.6 mmol/L (ref 3.5–5.1)
Sodium: 135 mmol/L (ref 135–145)
Total Bilirubin: 1 mg/dL (ref 0.3–1.2)
Total Protein: 6.6 g/dL (ref 6.5–8.1)

## 2017-06-22 LAB — I-STAT CHEM 8, ED
BUN: 38 mg/dL — ABNORMAL HIGH (ref 6–20)
CREATININE: 1.3 mg/dL — AB (ref 0.61–1.24)
Calcium, Ion: 1.12 mmol/L — ABNORMAL LOW (ref 1.15–1.40)
Chloride: 104 mmol/L (ref 101–111)
GLUCOSE: 177 mg/dL — AB (ref 65–99)
HEMATOCRIT: 40 % (ref 39.0–52.0)
Hemoglobin: 13.6 g/dL (ref 13.0–17.0)
POTASSIUM: 3.9 mmol/L (ref 3.5–5.1)
Sodium: 142 mmol/L (ref 135–145)
TCO2: 27 mmol/L (ref 22–32)

## 2017-06-22 LAB — I-STAT CG4 LACTIC ACID, ED: Lactic Acid, Venous: 4.08 mmol/L (ref 0.5–1.9)

## 2017-06-22 LAB — BRAIN NATRIURETIC PEPTIDE: B NATRIURETIC PEPTIDE 5: 24.8 pg/mL (ref 0.0–100.0)

## 2017-06-22 LAB — CBC WITH DIFFERENTIAL/PLATELET
BASOS ABS: 0 10*3/uL (ref 0.0–0.1)
Basophils Relative: 0 %
EOS PCT: 1 %
Eosinophils Absolute: 0.1 10*3/uL (ref 0.0–0.7)
HCT: 39.5 % (ref 39.0–52.0)
Hemoglobin: 13.5 g/dL (ref 13.0–17.0)
Lymphocytes Relative: 33 %
Lymphs Abs: 3.1 10*3/uL (ref 0.7–4.0)
MCH: 32.5 pg (ref 26.0–34.0)
MCHC: 34.2 g/dL (ref 30.0–36.0)
MCV: 95.2 fL (ref 78.0–100.0)
MONO ABS: 0.6 10*3/uL (ref 0.1–1.0)
Monocytes Relative: 6 %
Neutro Abs: 5.7 10*3/uL (ref 1.7–7.7)
Neutrophils Relative %: 60 %
PLATELETS: 183 10*3/uL (ref 150–400)
RBC: 4.15 MIL/uL — ABNORMAL LOW (ref 4.22–5.81)
RDW: 12.8 % (ref 11.5–15.5)
WBC: 9.5 10*3/uL (ref 4.0–10.5)

## 2017-06-22 LAB — I-STAT VENOUS BLOOD GAS, ED
Acid-Base Excess: 4 mmol/L — ABNORMAL HIGH (ref 0.0–2.0)
BICARBONATE: 30.6 mmol/L — AB (ref 20.0–28.0)
O2 Saturation: 88 %
PH VEN: 7.365 (ref 7.250–7.430)
PO2 VEN: 57 mmHg — AB (ref 32.0–45.0)
TCO2: 32 mmol/L (ref 22–32)
pCO2, Ven: 53.6 mmHg (ref 44.0–60.0)

## 2017-06-22 LAB — I-STAT TROPONIN, ED: TROPONIN I, POC: 0 ng/mL (ref 0.00–0.08)

## 2017-06-22 LAB — T4, FREE: FREE T4: 0.92 ng/dL (ref 0.61–1.12)

## 2017-06-22 LAB — CBG MONITORING, ED: GLUCOSE-CAPILLARY: 145 mg/dL — AB (ref 65–99)

## 2017-06-22 LAB — LIPASE, BLOOD: Lipase: 30 U/L (ref 11–51)

## 2017-06-22 LAB — TSH: TSH: 0.535 u[IU]/mL (ref 0.350–4.500)

## 2017-06-22 LAB — MAGNESIUM: MAGNESIUM: 2.1 mg/dL (ref 1.7–2.4)

## 2017-06-22 MED ORDER — SODIUM CHLORIDE 0.9 % IV BOLUS (SEPSIS)
500.0000 mL | Freq: Once | INTRAVENOUS | Status: AC
  Administered 2017-06-23: 500 mL via INTRAVENOUS

## 2017-06-22 MED ORDER — SODIUM CHLORIDE 0.9 % IV BOLUS (SEPSIS)
500.0000 mL | Freq: Once | INTRAVENOUS | Status: AC
  Administered 2017-06-22: 500 mL via INTRAVENOUS

## 2017-06-22 NOTE — ED Triage Notes (Signed)
Per EMS:  Presented after a syncopal episode, EMS arrived, HR in 20's, began to pace patient.  Pt given 2.5 [place] and 500 fluid en route.  Became more alert with EMS with pacing.

## 2017-06-22 NOTE — Progress Notes (Signed)
   06/22/17 2206  Clinical Encounter Type  Visited With Patient not available;Family;Patient and family together  Visit Type ED;Critical Care;Initial;Social support  Referral From Nurse  Spiritual Encounters  Spiritual Needs Emotional  Advance Directives (For Healthcare)  Does Patient Have a Medical Advance Directive? (unable to assess at this time)  [facility] asked to meet family in ED waiting area for patient; [facility] escorted family to room and liaison meeting with MD in patient room at bedside; [facility] available as needed. 10:39 PM Gwynn Burly

## 2017-06-22 NOTE — ED Notes (Signed)
Cardiology at bedside.

## 2017-06-23 ENCOUNTER — Inpatient Hospital Stay (HOSPITAL_COMMUNITY): Payer: Medicare Other

## 2017-06-23 ENCOUNTER — Ambulatory Visit (HOSPITAL_COMMUNITY)
Admission: EM | Disposition: A | Discharge status: Home / Self Care | Payer: Self-pay | Source: Home / Self Care | Attending: Emergency Medicine

## 2017-06-23 ENCOUNTER — Encounter (HOSPITAL_COMMUNITY): Payer: Self-pay | Admitting: Internal Medicine

## 2017-06-23 DIAGNOSIS — I442 Atrioventricular block, complete: Secondary | ICD-10-CM

## 2017-06-23 DIAGNOSIS — R55 Syncope and collapse: Secondary | ICD-10-CM

## 2017-06-23 DIAGNOSIS — I455 Other specified heart block: Secondary | ICD-10-CM

## 2017-06-23 DIAGNOSIS — I447 Left bundle-branch block, unspecified: Secondary | ICD-10-CM

## 2017-06-23 DIAGNOSIS — R001 Bradycardia, unspecified: Secondary | ICD-10-CM

## 2017-06-23 HISTORY — PX: PACEMAKER IMPLANT: EP1218

## 2017-06-23 LAB — I-STAT CG4 LACTIC ACID, ED: Lactic Acid, Venous: 2.9 mmol/L (ref 0.5–1.9)

## 2017-06-23 LAB — TROPONIN I: TROPONIN I: 0.05 ng/mL — AB (ref <0.03)

## 2017-06-23 SURGERY — PACEMAKER IMPLANT
Anesthesia: LOCAL

## 2017-06-23 MED ORDER — CEFAZOLIN SODIUM-DEXTROSE 2-4 GM/100ML-% IV SOLN
INTRAVENOUS | Status: AC
  Filled 2017-06-23: qty 100

## 2017-06-23 MED ORDER — LIDOCAINE HCL (PF) 1 % IJ SOLN
INTRAMUSCULAR | Status: AC
  Filled 2017-06-23: qty 60

## 2017-06-23 MED ORDER — VITAMIN E 180 MG (400 UNIT) PO CAPS
400.0000 [IU] | ORAL_CAPSULE | Freq: Every day | ORAL | Status: DC
  Administered 2017-06-24: 400 [IU] via ORAL
  Filled 2017-06-23: qty 1

## 2017-06-23 MED ORDER — SODIUM CHLORIDE 0.9 % IV SOLN: INTRAVENOUS | Status: AC

## 2017-06-23 MED ORDER — GLYCOPYRROLATE 0.2 MG/ML IJ SOLN: 0.1000 mg | Freq: Once | INTRAMUSCULAR | Status: DC

## 2017-06-23 MED ORDER — IOPAMIDOL (ISOVUE-370) INJECTION 76%
INTRAVENOUS | Status: AC
  Filled 2017-06-23: qty 50

## 2017-06-23 MED ORDER — ACETAMINOPHEN 325 MG PO TABS
325.0000 mg | ORAL_TABLET | ORAL | Status: DC | PRN
  Administered 2017-06-23 – 2017-06-24 (×3): 650 mg via ORAL
  Filled 2017-06-23 (×3): qty 2

## 2017-06-23 MED ORDER — SODIUM CHLORIDE 0.9 % IR SOLN
Status: AC
  Filled 2017-06-23: qty 2

## 2017-06-23 MED ORDER — HEPARIN (PORCINE) IN NACL 2-0.9 UNIT/ML-% IJ SOLN
INTRAMUSCULAR | Status: AC | PRN
  Administered 2017-06-23: 500 mL

## 2017-06-23 MED ORDER — ONDANSETRON HCL 4 MG/2ML IJ SOLN: 4.0000 mg | Freq: Four times a day (QID) | INTRAMUSCULAR | Status: DC | PRN

## 2017-06-23 MED ORDER — HEPARIN (PORCINE) IN NACL 2-0.9 UNIT/ML-% IJ SOLN
INTRAMUSCULAR | Status: AC
  Filled 2017-06-23: qty 500

## 2017-06-23 MED ORDER — CEFAZOLIN SODIUM-DEXTROSE 1-4 GM/50ML-% IV SOLN
1.0000 g | Freq: Four times a day (QID) | INTRAVENOUS | Status: AC
  Administered 2017-06-23 – 2017-06-24 (×3): 1 g via INTRAVENOUS
  Filled 2017-06-23 (×3): qty 50

## 2017-06-23 MED ORDER — SODIUM CHLORIDE 0.9 % IV SOLN
INTRAVENOUS | Status: DC
  Administered 2017-06-23: 03:00:00 via INTRAVENOUS

## 2017-06-23 MED ORDER — LIDOCAINE HCL (PF) 1 % IJ SOLN
INTRAMUSCULAR | Status: DC | PRN
  Administered 2017-06-23: 45 mL

## 2017-06-23 MED ORDER — SODIUM CHLORIDE 0.9 % IV SOLN: 500.0000 mL | INTRAVENOUS | Status: DC

## 2017-06-23 MED ORDER — CEFAZOLIN SODIUM-DEXTROSE 2-4 GM/100ML-% IV SOLN
2.0000 g | INTRAVENOUS | Status: AC
  Administered 2017-06-23: 2 g via INTRAVENOUS

## 2017-06-23 MED ORDER — ADULT MULTIVITAMIN W/MINERALS CH
1.0000 | ORAL_TABLET | Freq: Every day | ORAL | Status: DC
  Administered 2017-06-23 – 2017-06-24 (×2): 1 via ORAL
  Filled 2017-06-23 (×2): qty 1

## 2017-06-23 MED ORDER — ATROPINE SULFATE 0.4 MG/ML IJ SOLN
0.4000 mg | INTRAMUSCULAR | Status: DC | PRN
  Filled 2017-06-23: qty 0.4

## 2017-06-23 MED ORDER — ASPIRIN 81 MG PO CHEW
81.0000 mg | CHEWABLE_TABLET | Freq: Every day | ORAL | Status: DC
  Administered 2017-06-23 – 2017-06-24 (×2): 81 mg via ORAL
  Filled 2017-06-23 (×2): qty 1

## 2017-06-23 MED ORDER — SODIUM CHLORIDE 0.9 % IR SOLN
80.0000 mg | Status: AC
  Administered 2017-06-23: 80 mg

## 2017-06-23 SURGICAL SUPPLY — 8 items
CABLE SURGICAL S-101-97-12 (CABLE) ×2 IMPLANT
HEMOSTAT SURGICEL 2X4 FIBR (HEMOSTASIS) ×2 IMPLANT
LEAD CAPSURE NOVUS 5076-52CM (Lead) ×2 IMPLANT
LEAD CAPSURE NOVUS 5076-58CM (Lead) ×2 IMPLANT
PACEMAKER ASSURITY DR-RF (Pacemaker) ×2 IMPLANT
PAD DEFIB LIFELINK (PAD) ×2 IMPLANT
SHEATH CLASSIC 7F (SHEATH) ×4 IMPLANT
TRAY PACEMAKER INSERTION (PACKS) ×2 IMPLANT

## 2017-06-23 NOTE — ED Provider Notes (Signed)
[facility] EMERGENCY DEPARTMENT Provider Note   CSN: [id number] Arrival date & time: 06/22/17  2157     History   Chief Complaint Chief Complaint  Patient presents with  . Loss of Consciousness    HPI Jorge Ross is a 76 y.o. male.  HPI  76 year old male with a history of colon cancer/cecal adenoma s/p partial colectomy, left renal mass status post partial nephrectomy 2015, hypertension, presents with concern for syncopal episode and bradycardia.  Per family and patient, he is in normal state of health, when he was walking down the hallway at home, and suddenly developed syncope.  Patient appeared unresponsive, was staring, and breathing hard per family.  When EMS was called it was for a potential arrest, however on their arrival they found patient with a heart rate of 24.  They began pacing him given his altered mental status and bradycardia, and noted that his mental status improved, however he was having pain with pacing in the 2.5 mg of Versed.  On arrival to the emergency department, patient is following commands, however continues to be sleepy following Versed, and history is taken from family, although patient did deny pain, and acknowledge he had prior dyspnea on ROS.  On reevaluation, patient denies any pain, denies chest pain, denies shortness of breath, no other concerns, no recent infectious symptoms or medication changes.   Past Medical History:  Diagnosis Date  . Arthritis    HIPS  . Chronic kidney disease   . Colon cancer ([place]) 2015  . Hypertension   . 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 Unspecified disorder of intestine     Patient Active Problem List   Diagnosis Date Noted  . Symptomatic bradycardia 06/23/2017  . Port catheter in place 01/02/2016  . Colon cancer ([place]) 05/11/2014  . Left kidney mass s/p partial nephrectomy 04/02/2014  . 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 Occult blood in stools 02/10/2014  .  Hypertension 12/04/2010    Past Surgical History:  Procedure Laterality Date  . CERVICAL SPINE SURGERY     3 titamum screws  . COLECTOMY  03-2014  . COLON SURGERY  04/01/14  . COLONOSCOPY  2007   normal   . CYSTOSCOPY     03-15-16  . HERNIA REPAIR Bilateral    x 3 total   . PORT-A-CATH REMOVAL N/A 02/21/2016   Procedure: REMOVAL PORT-A-CATH;  Surgeon: Stark Klein, MD;  Location: [place];  Service: General;  Laterality: N/A;  . PORTACATH PLACEMENT N/A 05/24/2014   Procedure: INSERTION PORT-A-CATH;  Surgeon: Stark Klein, MD;  Location: [place];  Service: General;  Laterality: N/A;  . ROBOT ASSISTED LAPAROSCOPIC NEPHRECTOMY Left 04/01/2014   Procedure: ROBOTIC ASSISTED LAPAROSCOPIC LEFT PARTIAL NEPHRECTOMY;  Surgeon: Alexis Frock, MD;  Location: [facility];  Service: Urology;  Laterality: Left;  . ROTATOR CUFF REPAIR Left   . thumb ligament surgery Right        Home Medications    Prior to Admission medications   Medication Sig Start Date End Date Taking? Authorizing Provider  aspirin 81 MG tablet Take 81 mg by mouth daily.      [provider]  irbesartan-hydrochlorothiazide (AVALIDE) 150-12.5 MG per tablet Take 1 tablet by mouth every morning.  01/25/14   [provider]  Multiple Vitamin (MULTIVITAMIN) capsule Take 1 capsule by mouth daily.      [provider]  naproxen sodium (ANAPROX) 220 MG tablet Take 220 mg  by mouth 2 (two) times daily with a meal.    [provider]  vitamin E 400 UNIT capsule Take 400 Units by mouth daily.     [provider]    Family History Family History  Problem Relation Age of Onset  . Prostate cancer Paternal Uncle   . Lung cancer Paternal Uncle        smoker  . Colon cancer Neg Hx   . Rectal cancer Neg Hx   . Esophageal cancer Neg Hx   . Stomach cancer Neg Hx     Social History Social History   Tobacco Use  . Smoking status: Never Smoker  . Smokeless tobacco: Never Used    Substance Use Topics  . Alcohol use: Yes    Comment: rare  . Drug use: No     Allergies   Codeine   Review of Systems Review of Systems  Constitutional: Negative for fever.  HENT: Negative for congestion.   Eyes: Negative for visual disturbance.  Respiratory: Positive for shortness of breath. Negative for cough.   Cardiovascular: Negative for chest pain.  Gastrointestinal: Negative for abdominal pain, blood in stool, diarrhea, nausea and vomiting.  Genitourinary: Negative for dysuria.  Musculoskeletal: Negative for back pain and neck pain.  Neurological: Positive for syncope. Negative for headaches.     Physical Exam Updated Vital Signs BP 137/70   Pulse 79   Temp (!) 96.5 F (35.8 C) (Rectal)   Resp 13   SpO2 97%   Physical Exam  Constitutional: He appears well-developed and well-nourished. No distress.  HENT:  Head: Normocephalic and atraumatic.  Eyes: Conjunctivae and EOM are normal.  Neck: Normal range of motion.  Cardiovascular: Normal rate, regular rhythm, normal heart sounds and intact distal pulses. Exam reveals no gallop and no friction rub.  No murmur heard. Pulmonary/Chest: Effort normal and breath sounds normal. No respiratory distress. He has no wheezes. He has no rales.  Abdominal: Soft. He exhibits no distension. There is no tenderness. There is no guarding.  Musculoskeletal: He exhibits no edema.  Neurological: He is alert.  On arrival after versed GCS E3 V4 M6 13 GCS 15 on reeval  Skin: Skin is warm and dry. He is not diaphoretic.  Nursing note and vitals reviewed.    ED Treatments / Results  Labs (all labs ordered are listed, but only abnormal results are displayed) Labs Reviewed  CBC WITH DIFFERENTIAL/PLATELET - Abnormal; Notable for the following components:      Result Value   RBC 4.15 (*)    All other components within normal limits  COMPREHENSIVE METABOLIC PANEL - Abnormal; Notable for the following components:   Glucose, Bld 173  (*)    BUN 29 (*)    Creatinine, Ser 1.33 (*)    Calcium 8.4 (*)    AST 131 (*)    ALT 116 (*)    GFR calc non Af Amer 51 (*)    GFR calc Af Amer 59 (*)    All other components within normal limits  CBG MONITORING, ED - Abnormal; Notable for the following components:   Glucose-Capillary 145 (*)    All other components within normal limits  I-STAT CHEM 8, ED - Abnormal; Notable for the following components:   BUN 38 (*)    Creatinine, Ser 1.30 (*)    Glucose, Bld 177 (*)    Calcium, Ion 1.12 (*)    All other components within normal limits  I-STAT VENOUS BLOOD GAS, ED -  Abnormal; Notable for the following components:   pO2, Ven 57.0 (*)    Bicarbonate 30.6 (*)    Acid-Base Excess 4.0 (*)    All other components within normal limits  I-STAT CG4 LACTIC ACID, ED - Abnormal; Notable for the following components:   Lactic Acid, Venous 4.08 (*)    All other components within normal limits  BRAIN NATRIURETIC PEPTIDE  LIPASE, BLOOD  TSH  T4, FREE  MAGNESIUM  I-STAT TROPONIN, ED  I-STAT CG4 LACTIC ACID, ED    EKG  EKG Interpretation  Date/Time:  Sunday June 22 2017 22:03:37 EST Ventricular Rate:  74 PR Interval:    QRS Duration: 172 QT Interval:  417 QTC Calculation: 463 R Axis:   54 Text Interpretation:  Sinus rhythm Left bundle branch block Since prior ECG, patient does not have sinus pause at this time Confirmed by Gareth Morgan [phone]) on 06/22/2017 11:02:08 PM       Radiology Ct Head Wo Contrast  Result Date: 06/22/2017 CLINICAL DATA:  Head trauma. Syncopal episode. History of renal and colon cancer. Prior cervical spine surgery. EXAM: CT HEAD WITHOUT CONTRAST CT CERVICAL SPINE WITHOUT CONTRAST TECHNIQUE: Multidetector CT imaging of the head and cervical spine was performed following the standard protocol without intravenous contrast. Multiplanar CT image reconstructions of the cervical spine were also generated. COMPARISON:  Cervical spine MRI 08/19/2008  FINDINGS: CT HEAD FINDINGS Brain: No mass lesion, intraparenchymal hemorrhage or extra-axial collection. No evidence of acute cortical infarct. Brain parenchyma and CSF-containing spaces are normal for age. Vascular: No hyperdense vessel or unexpected calcification. Skull: Normal visualized skull base, calvarium and extracranial soft tissues. Sinuses/Orbits: No sinus fluid levels or advanced mucosal thickening. No mastoid effusion. Normal orbits. CT CERVICAL SPINE FINDINGS Alignment: There is ACDF hardware at C4-C7. There is osseous fusion across the C4-C5 and C5-C6 disc spaces. There is no osseous fusion at the C6-7 disc space. No static subluxation. Skull base and vertebrae: No acute fracture. Soft tissues and spinal canal: No prevertebral fluid or swelling. No visible canal hematoma. Disc levels: There is severe right C3-4 neural foraminal stenosis secondary to right facet hypertrophy and uncovertebral spurring. No bony spinal canal stenosis or other advanced foraminal stenosis. Upper chest: No pneumothorax, pulmonary nodule or pleural effusion. Other: Normal visualized paraspinal cervical soft tissues. IMPRESSION: 1. Normal brain. 2. No acute fracture or static subluxation of the cervical spine. 3. C4-C7 ACDF with osseous fusion from C4-C6. No focal hardware abnormality. 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 Electronically Signed   By: Ulyses Jarred M.D.   On: 06/22/2017 23:20   Ct Cervical Spine Wo Contrast  Result Date: 06/22/2017 CLINICAL DATA:  Head trauma. Syncopal episode. History of renal and colon cancer. Prior cervical spine surgery. EXAM: CT HEAD WITHOUT CONTRAST CT CERVICAL SPINE WITHOUT CONTRAST TECHNIQUE: Multidetector CT imaging of the head and cervical spine was performed following the standard protocol without intravenous contrast. Multiplanar CT image reconstructions of the cervical spine were also generated. COMPARISON:  Cervical spine MRI 08/19/2008 FINDINGS: CT HEAD FINDINGS  Brain: No mass lesion, intraparenchymal hemorrhage or extra-axial collection. No evidence of acute cortical infarct. Brain parenchyma and CSF-containing spaces are normal for age. Vascular: No hyperdense vessel or unexpected calcification. Skull: Normal visualized skull base, calvarium and extracranial soft tissues. Sinuses/Orbits: No sinus fluid levels or advanced mucosal thickening. No mastoid effusion. Normal orbits. CT CERVICAL SPINE FINDINGS Alignment: There is ACDF hardware at C4-C7. There is osseous fusion across the C4-C5 and C5-C6 disc spaces. There is  no osseous fusion at the C6-7 disc space. No static subluxation. Skull base and vertebrae: No acute fracture. Soft tissues and spinal canal: No prevertebral fluid or swelling. No visible canal hematoma. Disc levels: There is severe right C3-4 neural foraminal stenosis secondary to right facet hypertrophy and uncovertebral spurring. No bony spinal canal stenosis or other advanced foraminal stenosis. Upper chest: No pneumothorax, pulmonary nodule or pleural effusion. Other: Normal visualized paraspinal cervical soft tissues. IMPRESSION: 1. Normal brain. 2. No acute fracture or static subluxation of the cervical spine. 3. C4-C7 ACDF with osseous fusion from C4-C6. No focal hardware abnormality. 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 Electronically Signed   By: Ulyses Jarred M.D.   On: 06/22/2017 23:20   Dg Chest Portable 1 View  Result Date: 06/22/2017 CLINICAL DATA:  Syncope and bradycardia EXAM: PORTABLE CHEST 1 VIEW COMPARISON:  01/02/2017, 12/05/2010 FINDINGS: Surgical changes in the cervical spine. Mildly low lung volumes. No consolidation or effusion. Heart size upper normal. No pneumothorax. IMPRESSION: Low lung volumes with borderline cardiomegaly. Negative for edema or infiltrate. Electronically Signed   By: Donavan Foil M.D.   On: 06/22/2017 22:59    Procedures .Critical Care Performed by: Gareth Morgan, MD Authorized by:  Gareth Morgan, MD   Comments:     CRITICAL CARE: bradycardia Performed by: Tennis Must   Total critical care time: 30 minutes  Critical care time was exclusive of separately billable procedures and treating other patients.  Critical care was necessary to treat or prevent imminent or life-threatening deterioration.  Critical care was time spent personally by me on the following activities: development of treatment plan with patient and/or surrogate as well as nursing, discussions with consultants, evaluation of patient's response to treatment, examination of patient, obtaining history from patient or surrogate, ordering and performing treatments and interventions, ordering and review of laboratory studies, ordering and review of radiographic studies, pulse oximetry and re-evaluation of patient's condition.    (including critical care time)  Medications Ordered in ED Medications  sodium chloride 0.9 % bolus 500 mL (0 mLs Intravenous Stopped 06/22/17 2342)  sodium chloride 0.9 % bolus 500 mL (500 mLs Intravenous New Bag/Given 06/23/17 0016)     Initial Impression / Assessment and Plan / ED Course  I have reviewed the triage vital signs and the nursing notes.  Pertinent labs & imaging results that were available during my care of the patient were reviewed by me and considered in my medical decision making (see chart for details).      76 year old male with a history of colon cancer/cecal adenoma s/p partial colectomy, left renal mass status post partial nephrectomy 2015, hypertension, presents with concern for syncopal episode and bradycardia.  Patient being paced by EMS for HR of 24 with junctional rhythm en route, and on arrival to the ED he has had improvement in his heart rate to sinus rhythm with LBBB in the 70s and no longer required pacing.  Pads placed and zoll at bedside.  Noted to have past LBBB and past EKG showing sinus pause.  No Sgarbossa criteria. Troponin  negative.  Patient's mental status on arrival somnolent after receiving versed for pacing with EMS, however he is protecting is airway, following commands, answering yes or no questions.  His mental status improved during ED stay.  CT head and CSpine were ordered given syncope and fall without acute abnormalities.  XR chest without acute findings.  Denies pain when mental status improved, doubt other traumatic injuries at this time.  Family and pt deny infectious symptoms.  Mild elevation in transaminases without abdominal pain, doubt cholecystitis, cholangitis. Normal O2 saturation, no dyspnea or CP at this time and doubt PE as etiology of symptoms, suspect primary sick sinus/bradycardia as etiology of episode.   Labs significant for lactic acidosis and AKI, likely secondary to hypoperfusion during episode of bradycardia. No sign of sepsis.  Given 1L of NS.  TSH WNL.  Consulted Cardiology regarding episode of junctional bradycardia resulting in syncope and poor perfusion/lactic acidosis.  Patient to be admitted for further care. Family updated of plan of care.     Final Clinical Impressions(s) / ED Diagnoses   Final diagnoses:  Junctional bradycardia  Syncope, cardiogenic  Lactic acidosis  Acute kidney injury [facility])    ED Discharge Orders    None       Gareth Morgan, MD 06/23/17 0028

## 2017-06-23 NOTE — Discharge Instructions (Signed)
° ° °  Supplemental Discharge Instructions for  Pacemaker/Defibrillator Patients  Activity No heavy lifting or vigorous activity with your left/right arm for 6 to 8 weeks.  Do not raise your left/right arm above your head for one week.  Gradually raise your affected arm as drawn below.           __       06/27/17              06/28/17                      06/29/17                  06/30/17  NO DRIVING for 1 week      WOUND CARE - Keep the wound area clean and dry.  No bandage is needed on the site.  DO  NOT apply any creams, oils, or ointments to the wound area. - If you notice any drainage or discharge from the wound, any swelling or bruising at the site, or you develop a fever > 101? F after you are discharged home, call the office at once.  Special Instructions - You are still able to use cellular telephones; use the ear opposite the side where you have your pacemaker/defibrillator.  Avoid carrying your cellular phone near your device. - When traveling through airports, show security personnel your identification card to avoid being screened in the metal detectors.  Ask the security personnel to use the hand wand. - Avoid arc welding equipment, MRI testing (magnetic resonance imaging), TENS units (transcutaneous nerve stimulators).  Call the office for questions about other devices. - Avoid electrical appliances that are in poor condition or are not properly grounded. - Microwave ovens are safe to be near or to operate.

## 2017-06-23 NOTE — ED Notes (Signed)
Writer notified EDP Glick of abnormal I stat lactic result 

## 2017-06-23 NOTE — Discharge Summary (Signed)
ELECTROPHYSIOLOGY PROCEDURE DISCHARGE SUMMARY    Patient ID: Jorge Ross,  MRN: [id number], DOB/AGE: 29-Jan-1941 76 y.o.  Admit date: 06/22/2017 Discharge date: 06/24/2017  Primary Care Physician: [name], MD Electrophysiologist: Caryl Comes - new this admission  Primary Discharge Diagnosis:  Symptomatic bradycardia status post pacemaker implantation this admission  Secondary Discharge Diagnosis:  1.  LBBB 2.  HTN 3.  Renal cell carcinoma 4.  Colon cancer  Allergies  Allergen Reactions  . Codeine Other (See Comments)    constipation     Procedures This Admission:  1.  Implantation of a STJ dual chamber PPM on 06/23/17 by Dr Caryl Comes.  See op note for full details. There were no immediate post procedure complications. 2.  CXR on 06/24/17 demonstrated no pneumothorax status post device implantation.   Brief HPI: Jorge Ross is a 76 y.o. male presented to the ER via EMS with syncope and documented junctional rhythm, complete heart block. There were no reversible causes for bradycardia found. Risks, benefits, and alternatives to PPM implantation were reviewed with the patient who wished to proceed. Echo was obtained with normal LVEF.   Hospital Course:  The patient was admitted and underwent implantation of a STJ dual chamber PPM with details as outlined above.  He  was monitored on telemetry overnight which demonstrated atrial pacing with intrinsic ventricular conduction.  Left chest was without hematoma or ecchymosis.  The device was interrogated and found to be functioning normally.  CXR was obtained and demonstrated no pneumothorax status post device implantation.  Wound care, arm mobility, and restrictions were reviewed with the patient.  The patient was examined and considered stable for discharge to home.    Physical Exam: Vitals:   06/23/17 2050 06/23/17 2300 06/23/17 2321 06/24/17 0345  BP:  117/60 (!) 106/58 112/64  Pulse:  60 60 60  Resp:   14 15    Temp: 98.5 F (36.9 C)   98.1 F (36.7 C)  TempSrc: Axillary   Oral  SpO2:  100%  100%  Weight:    193 lb 11.2 oz (87.9 kg)  Height:        GEN- The patient is well appearing, alert and oriented x 3 today.   HEENT: normocephalic, atraumatic; sclera clear, conjunctiva pink; hearing intact; oropharynx clear; neck supple  Lungs- Clear to ausculation bilaterally, normal work of breathing.  No wheezes, rales, rhonchi Heart- Regular rate and rhythm, no murmurs, rubs or gallops  GI- soft, non-tender, non-distended, bowel sounds present  Extremities- no clubbing, cyanosis, or edema  MS- no significant deformity or atrophy Skin- warm and dry, no rash or lesion, left chest without hematoma/ecchymosis Psych- euthymic mood, full affect Neuro- strength and sensation are intact   Labs:   Lab Results  Component Value Date   WBC 9.5 06/22/2017   HGB 13.6 06/22/2017   HCT 40.0 06/22/2017   MCV 95.2 06/22/2017   PLT 183 06/22/2017    Recent Labs  Lab 06/22/17 2210 06/22/17 2233  NA 135 142  K 3.6 3.9  CL 102 104  CO2 23  --   BUN 29* 38*  CREATININE 1.33* 1.30*  CALCIUM 8.4*  --   PROT 6.6  --   BILITOT 1.0  --   ALKPHOS 71  --   ALT 116*  --   AST 131*  --   GLUCOSE 173* 177*    Discharge Medications:  Allergies as of 06/24/2017      Reactions   Codeine  Other (See Comments)   constipation      Medication List    TAKE these medications   aspirin 81 MG tablet Take 81 mg by mouth daily.   irbesartan-hydrochlorothiazide 150-12.5 MG tablet Commonly known as:  AVALIDE Take 1 tablet by mouth every morning.   multivitamin capsule Take 1 capsule by mouth daily.   naproxen sodium 220 MG tablet Commonly known as:  ALEVE Take 220 mg by mouth 2 (two) times daily with a meal.   vitamin E 400 UNIT capsule Take 400 Units by mouth daily.       Disposition:  Discharge Instructions    Diet - low sodium heart healthy   Complete by:  As directed    Increase activity  slowly   Complete by:  As directed      Follow-up Information    [facility] Follow up on 07/03/2017.   Specialty:  Cardiology Why:  at [facility] information: [address] [place]       Deboraha Sprang, MD Follow up on 09/24/2017.   Specialty:  Cardiology Why:  at 11:15AM Contact information: [address] [place] [address] [phone]           Duration of Discharge Encounter: Greater than 30 minutes including physician time.  Signed, Chanetta Marshall, NP 06/24/2017 7:33 AM

## 2017-06-23 NOTE — H&P (Signed)
Patient ID: YOSGART PAVEY MRN: [id number], DOB/AGE: Apr 11, 1941   Admit date: 06/22/2017   Primary Physician: [name], MD  Pt. Profile: 76 yo male w/ history of cecal and renal carcinoma, now s/p colectomy/nephrectomy/chemo (in remission) who presented with symptomatic bradycardia.   Problem List  Past Medical History:  Diagnosis Date  . Arthritis    HIPS  . Chronic kidney disease   . Colon cancer ([place]) 2015  . Hypertension   . 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 Unspecified disorder of intestine     Past Surgical History:  Procedure Laterality Date  . CERVICAL SPINE SURGERY     3 titamum screws  . COLECTOMY  03-2014  . COLON SURGERY  04/01/14  . COLONOSCOPY  2007   normal   . CYSTOSCOPY     03-15-16  . HERNIA REPAIR Bilateral    x 3 total   . PORT-A-CATH REMOVAL N/A 02/21/2016   Procedure: REMOVAL PORT-A-CATH;  Surgeon: Stark Klein, MD;  Location: [facility];  Service: General;  Laterality: N/A;  . PORTACATH PLACEMENT N/A 05/24/2014   Procedure: INSERTION PORT-A-CATH;  Surgeon: Stark Klein, MD;  Location: [facility];  Service: General;  Laterality: N/A;  . ROBOT ASSISTED LAPAROSCOPIC NEPHRECTOMY Left 04/01/2014   Procedure: ROBOTIC ASSISTED LAPAROSCOPIC LEFT PARTIAL NEPHRECTOMY;  Surgeon: Alexis Frock, MD;  Location: [facility];  Service: Urology;  Laterality: Left;  . ROTATOR CUFF REPAIR Left   . thumb ligament surgery Right      Allergies  Allergies  Allergen Reactions  . Codeine Other (See Comments)    constipation    HPI Mr. Bruschi reports over the last several days he has had infrequent dizzy spells. Denies any prior syncope, near syncope, LOC, palpitations or feeling of flushed.  Today around 9pm he had just placed some empty suitcases in the attach when he was walking down the hall. He suddenly passed out and had LOC. His wife found him angonly breathing and called EMS. They arrived within minutes and found his HR to be in the 20s. They  transcutaneously paced them. Not sure why they didn't try atropine first.   Here in the ED, he is sinus with a LBBB in the 70-80 and asymptomatic.   He denies any recent illness, no new medications, no symptoms of chest pain and has never had MI/heart surgeries. Never had holter or event monitor. No history of afib.   Father had a PM in his 61s for "slow heart rate."  Home Medications  Prior to Admission medications   Medication Sig Start Date End Date Taking? Authorizing Provider  aspirin 81 MG tablet Take 81 mg by mouth daily.      [provider]  irbesartan-hydrochlorothiazide (AVALIDE) 150-12.5 MG per tablet Take 1 tablet by mouth every morning.  01/25/14   [provider]  Multiple Vitamin (MULTIVITAMIN) capsule Take 1 capsule by mouth daily.      [provider]  naproxen sodium (ANAPROX) 220 MG tablet Take 220 mg by mouth 2 (two) times daily with a meal.    [provider]  vitamin E 400 UNIT capsule Take 400 Units by mouth daily.     [provider]    Family History  Family History  Problem Relation Age of Onset  . Prostate cancer Paternal Uncle   . Lung cancer Paternal Uncle        smoker  . Colon cancer Neg Hx   . Rectal  cancer Neg Hx   . Esophageal cancer Neg Hx   . Stomach cancer Neg Hx     Social History  Social History   Socioeconomic History  . Marital status: Married    Spouse name: Not on file  . Number of children: 1  . Years of education: Not on file  . Highest education level: Not on file  Social Needs  . Financial resource strain: Not on file  . Food insecurity - worry: Not on file  . Food insecurity - inability: Not on file  . Transportation needs - medical: Not on file  . Transportation needs - non-medical: Not on file  Occupational History  . 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 Smoking status: Never Smoker  . Smokeless tobacco: Never Used  Substance and  Sexual Activity  . Alcohol use: Yes    Comment: rare  . Drug use: No  . Sexual activity: Not on file  Other Topics Concern  . Not on file  Social History Narrative  . Not on file     Review of Systems General:  No chills, fever, night sweats or weight changes.  Cardiovascular:  No chest pain, dyspnea on exertion, edema, orthopnea, palpitations, paroxysmal nocturnal dyspnea. Dermatological: No rash, lesions/masses Respiratory: No cough, dyspnea Urologic: No hematuria, dysuria Abdominal:   No nausea, vomiting, diarrhea, bright red blood per rectum, melena, or hematemesis Neurologic:  No visual changes, wkns, changes in mental status. All other systems reviewed and are otherwise negative except as noted above.  Physical Exam  Blood pressure 137/70, pulse 79, temperature (!) 96.5 F (35.8 C), temperature source Rectal, resp. rate 13, SpO2 97 %.  General: Pleasant, NAD Psych: Normal affect. Neuro: Alert and oriented X 3. Moves all extremities spontaneously. HEENT: Normal  Neck: Supple without bruits or JVD. Lungs:  Resp regular and unlabored, CTA. Heart: RRR no s3, s4, or murmurs. Abdomen: Soft, non-tender, non-distended, BS + x 4.  Extremities: No clubbing, cyanosis or edema. DP/PT/Radials 2+ and equal bilaterally.  Labs  Troponin [facility] Test) Recent Labs    06/22/17 2230  TROPIPOC 0.00   No results for input(s): CKTOTAL, CKMB, TROPONINI in the last 72 hours. Lab Results  Component Value Date   WBC 9.5 06/22/2017   HGB 13.6 06/22/2017   HCT 40.0 06/22/2017   MCV 95.2 06/22/2017   PLT 183 06/22/2017    Recent Labs  Lab 06/22/17 2210 06/22/17 2233  NA 135 142  K 3.6 3.9  CL 102 104  CO2 23  --   BUN 29* 38*  CREATININE 1.33* 1.30*  CALCIUM 8.4*  --   PROT 6.6  --   BILITOT 1.0  --   ALKPHOS 71  --   ALT 116*  --   AST 131*  --   GLUCOSE 173* 177*   No results found for: CHOL, HDL, LDLCALC, TRIG No results found for: DDIMER    Radiology/Studies  Ct Head Wo Contrast  Result Date: 06/22/2017 CLINICAL DATA:  Head trauma. Syncopal episode. History of renal and colon cancer. Prior cervical spine surgery. EXAM: CT HEAD WITHOUT CONTRAST CT CERVICAL SPINE WITHOUT CONTRAST TECHNIQUE: Multidetector CT imaging of the head and cervical spine was performed following the standard protocol without intravenous contrast. Multiplanar CT image reconstructions of the cervical spine were also generated. COMPARISON:  Cervical spine MRI 08/19/2008 FINDINGS: CT HEAD FINDINGS Brain: No mass lesion, intraparenchymal hemorrhage or extra-axial collection. No evidence of acute cortical  infarct. Brain parenchyma and CSF-containing spaces are normal for age. Vascular: No hyperdense vessel or unexpected calcification. Skull: Normal visualized skull base, calvarium and extracranial soft tissues. Sinuses/Orbits: No sinus fluid levels or advanced mucosal thickening. No mastoid effusion. Normal orbits. CT CERVICAL SPINE FINDINGS Alignment: There is ACDF hardware at C4-C7. There is osseous fusion across the C4-C5 and C5-C6 disc spaces. There is no osseous fusion at the C6-7 disc space. No static subluxation. Skull base and vertebrae: No acute fracture. Soft tissues and spinal canal: No prevertebral fluid or swelling. No visible canal hematoma. Disc levels: There is severe right C3-4 neural foraminal stenosis secondary to right facet hypertrophy and uncovertebral spurring. No bony spinal canal stenosis or other advanced foraminal stenosis. Upper chest: No pneumothorax, pulmonary nodule or pleural effusion. Other: Normal visualized paraspinal cervical soft tissues. IMPRESSION: 1. Normal brain. 2. No acute fracture or static subluxation of the cervical spine. 3. C4-C7 ACDF with osseous fusion from C4-C6. No focal hardware abnormality. 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 Electronically Signed   By: Ulyses Jarred M.D.   On: 06/22/2017 23:20   Ct Cervical Spine  Wo Contrast  Result Date: 06/22/2017 CLINICAL DATA:  Head trauma. Syncopal episode. History of renal and colon cancer. Prior cervical spine surgery. EXAM: CT HEAD WITHOUT CONTRAST CT CERVICAL SPINE WITHOUT CONTRAST TECHNIQUE: Multidetector CT imaging of the head and cervical spine was performed following the standard protocol without intravenous contrast. Multiplanar CT image reconstructions of the cervical spine were also generated. COMPARISON:  Cervical spine MRI 08/19/2008 FINDINGS: CT HEAD FINDINGS Brain: No mass lesion, intraparenchymal hemorrhage or extra-axial collection. No evidence of acute cortical infarct. Brain parenchyma and CSF-containing spaces are normal for age. Vascular: No hyperdense vessel or unexpected calcification. Skull: Normal visualized skull base, calvarium and extracranial soft tissues. Sinuses/Orbits: No sinus fluid levels or advanced mucosal thickening. No mastoid effusion. Normal orbits. CT CERVICAL SPINE FINDINGS Alignment: There is ACDF hardware at C4-C7. There is osseous fusion across the C4-C5 and C5-C6 disc spaces. There is no osseous fusion at the C6-7 disc space. No static subluxation. Skull base and vertebrae: No acute fracture. Soft tissues and spinal canal: No prevertebral fluid or swelling. No visible canal hematoma. Disc levels: There is severe right C3-4 neural foraminal stenosis secondary to right facet hypertrophy and uncovertebral spurring. No bony spinal canal stenosis or other advanced foraminal stenosis. Upper chest: No pneumothorax, pulmonary nodule or pleural effusion. Other: Normal visualized paraspinal cervical soft tissues. IMPRESSION: 1. Normal brain. 2. No acute fracture or static subluxation of the cervical spine. 3. C4-C7 ACDF with osseous fusion from C4-C6. No focal hardware abnormality. 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 Electronically Signed   By: Ulyses Jarred M.D.   On: 06/22/2017 23:20   Dg Chest Portable 1 View  Result Date:  06/22/2017 CLINICAL DATA:  Syncope and bradycardia EXAM: PORTABLE CHEST 1 VIEW COMPARISON:  01/02/2017, 12/05/2010 FINDINGS: Surgical changes in the cervical spine. Mildly low lung volumes. No consolidation or effusion. Heart size upper normal. No pneumothorax. IMPRESSION: Low lung volumes with borderline cardiomegaly. Negative for edema or infiltrate. Electronically Signed   By: Donavan Foil M.D.   On: 06/22/2017 22:59    ECG LBBB since 2015.  EMS strips from field:    ASSESSMENT AND PLAN 76 yo very active male w/ history of RCC and cecal cancer in remission who presents with symptomatic bradycardia with LOC.  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 No recent illness  or new medications. No history of arrhythmias. LBBB since 2015, but no ischemic work-up previously.   He had a prior chemo port in his left chest (hopefully did not cause venous stenosis). He is right handed and never broke his shoulder/clavicle.   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 If normal WM with normal EF, would benefit from dual-chamber ICD    # AKI: from hypotension - trend  # Lactic acidosis: from hypotension in setting of bradycardia - trend and will give gentle IVF  FULL CODE  Signed, Charlies Silvers, MD Cardiology Fellow

## 2017-06-23 NOTE — Consult Note (Signed)
ELECTROPHYSIOLOGY CONSULT NOTE    Patient ID: Jorge Ross MRN: [id number], DOB/AGE: 74-Feb-1942 74 y.o.  Admit date: 06/22/2017 Date of Consult: 06/23/2017  Primary Physician: [name], MD Electrophysiologist: Caryl Comes (new this admission)  Patient Profile: Jorge Ross is a 76 y.o. male with a history of renal cell carcinoma and hypertension who is being seen today for the evaluation of junctional bradycardia at the request of Dr Elson Areas.  HPI:  Jorge Ross is a 76 y.o. male with no prior cardiac history.  He has a history of colon and renal cell carcinoma now in remission. He had returned yesterday from [place] for the Thanksgiving holiday and was putting suitcases away. He then became dizzy and passed out. His wife heard him hit the floor and was unable to lift him. She called 911. On their arrival, heart rate was in the 20's and he was transcutaneously paced for a short period of time. He was transported to [facility] On telemetry here, he has been in [place] with periodic sinus pauses.  He has had intermittent dizzy spells for the last few days that last seconds and resolve on their own.  He is very active at home doing yard work without functional limitations.   Echo is pending.   He denies chest pain, palpitations, dyspnea, PND, orthopnea, nausea, vomiting, edema, weight gain, or early satiety.  No antecedent hx of presyncope  ECG 2017 >> sinus pauses   Port a cath previously in place now removed  2017  Past Medical History:  Diagnosis Date  . Arthritis    HIPS  . Chronic kidney disease   . Colon cancer ([place]) 2015  . Hypertension   . 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 Unspecified disorder of intestine      Surgical History:  Past Surgical History:  Procedure Laterality Date  . CERVICAL SPINE SURGERY     3 titamum screws  . COLECTOMY  03-2014  . COLON SURGERY  04/01/14  . COLONOSCOPY  2007   normal   . CYSTOSCOPY     03-15-16  . HERNIA REPAIR Bilateral    x 3 total   . PORT-A-CATH REMOVAL N/A 02/21/2016   Procedure: REMOVAL PORT-A-CATH;  Surgeon: Stark Othella Slappey, MD;  Location: [facility];  Service: General;  Laterality: N/A;  . PORTACATH PLACEMENT N/A 05/24/2014   Procedure: INSERTION PORT-A-CATH;  Surgeon: Stark Aylyn Wenzler, MD;  Location: [facility];  Service: General;  Laterality: N/A;  . ROBOT ASSISTED LAPAROSCOPIC NEPHRECTOMY Left 04/01/2014   Procedure: ROBOTIC ASSISTED LAPAROSCOPIC LEFT PARTIAL NEPHRECTOMY;  Surgeon: Alexis Frock, MD;  [facility];  Service: Urology;  Laterality: Left;  . ROTATOR CUFF REPAIR Left   . thumb ligament surgery Right      Current Outpatient Medications:  .  aspirin 81 MG tablet, Take 81 mg by mouth daily.  , Disp: , Rfl:  .  irbesartan-hydrochlorothiazide (AVALIDE) 150-12.5 MG per tablet, Take 1 tablet by mouth every morning. , Disp: , Rfl:  .  Multiple Vitamin (MULTIVITAMIN) capsule, Take 1 capsule by mouth daily.  , Disp: , Rfl:  .  naproxen sodium (ANAPROX) 220 MG tablet, Take 220 mg by mouth 2 (two) times daily with a meal., Disp: , Rfl:  .  vitamin E 400 UNIT capsule, Take 400 Units by mouth daily. , Disp: , Rfl:    Inpatient Medications: . aspirin  81 mg Oral Daily  . multivitamin with minerals  1 tablet Oral Daily  .  vitamin E  400 Units Oral Daily    Allergies:  Allergies  Allergen Reactions  . Codeine Other (See Comments)    constipation    Social History   Socioeconomic History  . Marital status: Married    Spouse name: Not on file  . Number of children: 1  . Years of education: Not on file  . Highest education level: Not on file  Social Needs  . Financial resource strain: Not on file  . Food insecurity - worry: Not on file  . Food insecurity - inability: Not on file  . Transportation needs - medical: Not on file  . Transportation needs - non-medical: Not on file  Occupational History  . 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 Smoking  status: Never Smoker  . Smokeless tobacco: Never Used  Substance and Sexual Activity  . Alcohol use: Yes    Comment: rare  . Drug use: No  . Sexual activity: Not on file  Other Topics Concern  . Not on file  Social History Narrative  . Not on file     Family History  Problem Relation Age of Onset  . Prostate cancer Paternal Uncle   . Lung cancer Paternal Uncle        smoker  . Colon cancer Neg Hx   . Rectal cancer Neg Hx   . Esophageal cancer Neg Hx   . Stomach cancer Neg Hx      Review of Systems: All other systems reviewed and are otherwise negative except as noted above.  Physical Exam: Vitals:   06/23/17 0400 06/23/17 0453 06/23/17 0500 06/23/17 0600  BP: 122/63  (!) 122/58 (!) 106/56  Pulse: 77 75 75 64  Resp: 14 14 15 12   Temp:      TempSrc:      SpO2: 95% 95% 95% 96%    GEN- The patient is well appearing, alert and oriented x 3 today.   HEENT: normocephalic, atraumatic; sclera clear, conjunctiva pink; hearing intact; oropharynx clear; neck supple Lungs- Clear to ausculation bilaterally, normal work of breathing.  No wheezes, rales, rhonchi Heart- Regular rate and rhythm,   rubs or gallops  2/6 M GI- soft, non-tender, non-distended, bowel sounds present Extremities- no clubbing, cyanosis, or edema  MS- no significant deformity or atrophy Skin- warm and dry, no rash or lesion Psych- euthymic mood, full affect Neuro- strength and sensation are intact  Labs:  Lab Results  Component Value Date   WBC 9.5 06/22/2017   HGB 13.6 06/22/2017   HCT 40.0 06/22/2017   MCV 95.2 06/22/2017   PLT 183 06/22/2017   Recent Labs  Lab 06/22/17 2210 06/22/17 2233  NA 135 142  K 3.6 3.9  CL 102 104  CO2 23  --   BUN 29* 38*  CREATININE 1.33* 1.30*  CALCIUM 8.4*  --   PROT 6.6  --   BILITOT 1.0  --   ALKPHOS 71  --   ALT 116*  --   AST 131*  --   GLUCOSE 173* 177*      Radiology/Studies: Ct Head Wo Contrast  Result Date: 06/22/2017 CLINICAL DATA:  Head  trauma. Syncopal episode. History of renal and colon cancer. Prior cervical spine surgery. EXAM: CT HEAD WITHOUT CONTRAST CT CERVICAL SPINE WITHOUT CONTRAST TECHNIQUE: Multidetector CT imaging of the head and cervical spine was performed following the standard protocol without intravenous contrast. Multiplanar CT image reconstructions of the cervical spine were also  generated. COMPARISON:  Cervical spine MRI 08/19/2008 FINDINGS: CT HEAD FINDINGS Brain: No mass lesion, intraparenchymal hemorrhage or extra-axial collection. No evidence of acute cortical infarct. Brain parenchyma and CSF-containing spaces are normal for age. Vascular: No hyperdense vessel or unexpected calcification. Skull: Normal visualized skull base, calvarium and extracranial soft tissues. Sinuses/Orbits: No sinus fluid levels or advanced mucosal thickening. No mastoid effusion. Normal orbits. CT CERVICAL SPINE FINDINGS Alignment: There is ACDF hardware at C4-C7. There is osseous fusion across the C4-C5 and C5-C6 disc spaces. There is no osseous fusion at the C6-7 disc space. No static subluxation. Skull base and vertebrae: No acute fracture. Soft tissues and spinal canal: No prevertebral fluid or swelling. No visible canal hematoma. Disc levels: There is severe right C3-4 neural foraminal stenosis secondary to right facet hypertrophy and uncovertebral spurring. No bony spinal canal stenosis or other advanced foraminal stenosis. Upper chest: No pneumothorax, pulmonary nodule or pleural effusion. Other: Normal visualized paraspinal cervical soft tissues. IMPRESSION: 1. Normal brain. 2. No acute fracture or static subluxation of the cervical spine. 3. C4-C7 ACDF with osseous fusion from C4-C6. No focal hardware abnormality. 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 Electronically Signed   By: Ulyses Jarred M.D.   On: 06/22/2017 23:20   Ct Cervical Spine Wo Contrast  Result Date: 06/22/2017 CLINICAL DATA:  Head trauma. Syncopal episode.  History of renal and colon cancer. Prior cervical spine surgery. EXAM: CT HEAD WITHOUT CONTRAST CT CERVICAL SPINE WITHOUT CONTRAST TECHNIQUE: Multidetector CT imaging of the head and cervical spine was performed following the standard protocol without intravenous contrast. Multiplanar CT image reconstructions of the cervical spine were also generated. COMPARISON:  Cervical spine MRI 08/19/2008 FINDINGS: CT HEAD FINDINGS Brain: No mass lesion, intraparenchymal hemorrhage or extra-axial collection. No evidence of acute cortical infarct. Brain parenchyma and CSF-containing spaces are normal for age. Vascular: No hyperdense vessel or unexpected calcification. Skull: Normal visualized skull base, calvarium and extracranial soft tissues. Sinuses/Orbits: No sinus fluid levels or advanced mucosal thickening. No mastoid effusion. Normal orbits. CT CERVICAL SPINE FINDINGS Alignment: There is ACDF hardware at C4-C7. There is osseous fusion across the C4-C5 and C5-C6 disc spaces. There is no osseous fusion at the C6-7 disc space. No static subluxation. Skull base and vertebrae: No acute fracture. Soft tissues and spinal canal: No prevertebral fluid or swelling. No visible canal hematoma. Disc levels: There is severe right C3-4 neural foraminal stenosis secondary to right facet hypertrophy and uncovertebral spurring. No bony spinal canal stenosis or other advanced foraminal stenosis. Upper chest: No pneumothorax, pulmonary nodule or pleural effusion. Other: Normal visualized paraspinal cervical soft tissues. IMPRESSION: 1. Normal brain. 2. No acute fracture or static subluxation of the cervical spine. 3. C4-C7 ACDF with osseous fusion from C4-C6. No focal hardware abnormality. 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 Electronically Signed   By: Ulyses Jarred M.D.   On: 06/22/2017 23:20   Dg Chest Portable 1 View  Result Date: 06/22/2017 CLINICAL DATA:  Syncope and bradycardia EXAM: PORTABLE CHEST 1 VIEW COMPARISON:   01/02/2017, 12/05/2010 FINDINGS: Surgical changes in the cervical spine. Mildly low lung volumes. No consolidation or effusion. Heart size upper normal. No pneumothorax. IMPRESSION: Low lung volumes with borderline cardiomegaly. Negative for edema or infiltrate. Electronically Signed   By: Donavan Foil M.D.   On: 06/22/2017 22:59    FYB:OFBPZ rhythm, LBBB (personally reviewed)  2017  Sinus with LBBB and recurrent sinus pauses  TELEMETRY: sinus rhythm, intermittent sinus pauses (personally reviewed)  Assessment/Plan: 1.  Symptomatic  bradycardia, junctional bradycardia, intermittent complete heart block The patient presents with syncope and symptomatic bradycardia requiring transcutaneous pacing He has no prior cardiac history and is on no AVN blocking agents at home Echo pending this morning Depending on results of echo, plan to proceed with either dual chamber PPM or CRTP.  Risks, benefits reviewed with patient who wishes to proceed.  He does have a port in left chest from prior chemo.   2.  HTN Stable No change required today  Dr Caryl Comes to see later today   Signed, Chanetta Marshall 06/23/2017 7:59 AM  Pt interviewed and examined as above with modifications made  Assessment and Plan  Syncope  LBBB  Sinus node Dysfunction  HTN  Echo>> normal EF   Pt with symptomatic bradycardia and syncope.  Will need pacing. No obvious culprits.  Will await echo>> will need to consider DDx of sinus node dysfunction and LBBB, incl amyloid and as afircan american  Exclude TTR     The benefits and risks were reviewed including but not limited to death,  perforation, infection, lead dislodgement and device malfunction.  The patient understands agrees and is willing to proceed.

## 2017-06-23 NOTE — Progress Notes (Signed)
  Echocardiogram 2D Echocardiogram has been performed.  Jorge Ross 06/23/2017, 8:50 AM

## 2017-06-23 NOTE — ED Notes (Signed)
Jorge Ross @ [phone]

## 2017-06-24 ENCOUNTER — Inpatient Hospital Stay (HOSPITAL_COMMUNITY): Payer: Medicare Other

## 2017-06-24 DIAGNOSIS — I447 Left bundle-branch block, unspecified: Secondary | ICD-10-CM | POA: Diagnosis not present

## 2017-06-24 DIAGNOSIS — I129 Hypertensive chronic kidney disease with stage 1 through stage 4 chronic kidney disease, or unspecified chronic kidney disease: Secondary | ICD-10-CM | POA: Diagnosis not present

## 2017-06-24 DIAGNOSIS — N189 Chronic kidney disease, unspecified: Secondary | ICD-10-CM | POA: Diagnosis not present

## 2017-06-24 DIAGNOSIS — I495 Sick sinus syndrome: Secondary | ICD-10-CM | POA: Diagnosis not present

## 2017-06-24 MED ORDER — IRBESARTAN 150 MG PO TABS
150.0000 mg | ORAL_TABLET | Freq: Every day | ORAL | Status: DC
  Administered 2017-06-24: 150 mg via ORAL
  Filled 2017-06-24: qty 1

## 2017-06-24 MED FILL — Sodium Chloride Irrigation Soln 0.9%: Qty: 500 | Status: AC

## 2017-06-24 MED FILL — Gentamicin Sulfate Inj 40 MG/ML: INTRAMUSCULAR | Qty: 2 | Status: AC

## 2017-06-24 NOTE — Progress Notes (Signed)
Physical therapy, department director, Amber NP, and case manager have come to discuss discharge at length.  Will cont to monitor pt.

## 2017-06-24 NOTE — Evaluation (Signed)
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 PMH includes CKD, colon CA, HTN, cervical spine surgery, and L rotator cuff repair.    Clinical Impression   Pt is typically independent. He presents with decreased activity tolerance and mild balance deficits. Educated pt and family at length on pacemaker precautions, sling use and compensatory strategies for ADL. Pt returned or verbalized understanding. Pt to discharge home today, will defer further OT to [facility].    Follow Up Recommendations  Home health OT    Equipment Recommendations  Kasandra Knudsen)    Recommendations for Other Services       Precautions / Restrictions Precautions Precautions: ICD/Pacemaker Precaution Comments: LUE precautions secondary to pacemaker placement. Reviewed pacemaker precautions with pt and family.  Required Braces or Orthoses: Sling Restrictions Weight Bearing Restrictions: Yes LUE Weight Bearing: Non weight bearing      Mobility Bed Mobility Overal bed mobility: Needs Assistance Bed Mobility: Supine to Sit     Supine to sit: Supervision     General bed mobility comments: Supervision for safety.   Transfers Overall transfer level: Needs assistance Equipment used: None Transfers: Sit to/from Stand Sit to Stand: Supervision         General transfer comment: Min guard for safety. Maintained precautions throughout.     Balance Overall balance assessment: Needs assistance Sitting-balance support: No upper extremity supported;Feet supported Sitting balance-Leahy Scale: Good     Standing balance support: No upper extremity supported;During functional activity Standing balance-Leahy Scale: Fair                             ADL either performed or assessed with clinical  judgement   ADL Overall ADL's : Needs assistance/impaired Eating/Feeding: Independent;Sitting   Grooming: Wash/dry hands;Standing;Supervision/safety   Upper Body Bathing: Minimal assistance;Sitting   Lower Body Bathing: Supervison/ safety;Sit to/from stand   Upper Body Dressing : Minimal assistance;Sitting   Lower Body Dressing: Minimal assistance;Sit to/from stand Lower Body Dressing Details (indicate cue type and reason): to tie shoes Toilet Transfer: Supervision/safety;Ambulation;Regular Toilet;Grab bars Toilet Transfer Details (indicate cue type and reason): pt has a sink beside toilet at home on R side Toileting- Clothing Manipulation and Hygiene: Supervision/safety;Sit to/from stand       Functional mobility during ADLs: Supervision/safety General ADL Comments: Educated pt in sling donning and doffing, wearing schedule including sleeping, NWB status of L UE and to get up to R side of bed, compensatory strategies for ADL     Vision Baseline Vision/History: Wears glasses Wears Glasses: At all times       Perception     Praxis      Pertinent Vitals/Pain Pain Assessment: No/denies pain Faces Pain Scale: Hurts a little bit Pain Location: incision site  Pain Descriptors / Indicators: Aching;Operative site guarding Pain Intervention(s): Limited activity within patient's tolerance;Monitored during session;Repositioned     Hand Dominance Right   Extremity/Trunk Assessment Upper Extremity Assessment Upper Extremity Assessment: LUE deficits/detail LUE Deficits / Details: LUE in sling secondary to pacemaker precautions.  LUE Coordination: decreased gross motor   Lower Extremity Assessment Lower Extremity Assessment: Generalized weakness   Cervical / Trunk Assessment Cervical / Trunk Assessment: Kyphotic   Communication Communication Communication: No difficulties   Cognition Arousal/Alertness: Awake/alert Behavior During Therapy: WFL for tasks  assessed/performed  Overall Cognitive Status: Within Functional Limits for tasks assessed                                 General Comments: some difficulty generalizing education   General Comments  Pt's family very nervous about pt going home, as they report pt's blood pressure is elevated. Checked BP and SBP in mid 140s to low 150s(after gait training) and decreased to upper 130s upon returned to supine. Educated that it may take time for the medicines to take affect to decrease BP. Notified RN. Educated about role of PT and need for supervision at home initially and pt agreeable. Pt's family requesting to speak to MD and notified RN.     Exercises     Shoulder Instructions      Home Living Family/patient expects to be discharged to:: Private residence Living Arrangements: Spouse/significant other Available Help at Discharge: Family;Available 24 hours/day Type of Home: House Home Access: Stairs to enter [employer] of Steps: 3 Entrance Stairs-Rails: Right Home Layout: Two level Alternate Level Stairs-Number of Steps: 2 Alternate Level Stairs-Rails: Right Bathroom Shower/Tub: Occupational psychologist: Standard     Home Equipment: Environmental consultant - 2 wheels;Cane - single point;Shower seat(cane is his wife's)          Prior Functioning/Environment Level of Independence: Independent                 OT Problem List: Impaired balance (sitting and/or standing);Decreased knowledge of precautions      OT Treatment/Interventions:      OT Goals(Current goals can be found in the care plan section) Acute Rehab OT Goals Patient Stated Goal: to feel better   OT Frequency:     Barriers to D/C:            Co-evaluation              AM-PAC PT "6 Clicks" Daily Activity     Outcome Measure Help from another person eating meals?: None Help from another person taking care of personal grooming?: A Little Help from another person toileting, which  includes using toliet, bedpan, or urinal?: A Little Help from another person bathing (including washing, rinsing, drying)?: A Little Help from another person to put on and taking off regular upper body clothing?: A Little Help from another person to put on and taking off regular lower body clothing?: A Little 6 Click Score: 19   End of Session Equipment Utilized During Treatment: Gait belt  Activity Tolerance: Patient limited by fatigue Patient left: in bed;with call bell/phone within reach;with family/visitor present  OT Visit Diagnosis: Unsteadiness on feet (R26.81)                Time: 4166-0630 OT Time Calculation (min): 30 min Charges:  OT General Charges $OT Visit: 1 Visit OT Evaluation $OT Eval Moderate Complexity: 1 Mod OT Treatments $Self Care/Home Management : 8-22 mins G-Codes: OT G-codes **NOT FOR INPATIENT CLASS** Functional Assessment Tool Used: Clinical judgement Functional Limitation: Self care Self Care Current Status (Z6010): At least 20 percent but less than 40 percent impaired, limited or restricted Self Care Discharge Status [phone]): At least 20 percent but less than 40 percent impaired, limited or restricted   Malka So 06/24/2017, 4:42 PM  06/24/2017 Nestor Lewandowsky, OTR/L Pager: [phone]

## 2017-06-24 NOTE — Progress Notes (Signed)
Pt has cane to take home.

## 2017-06-24 NOTE — Care Management CC44 (Signed)
Condition Code 44 Documentation Completed  Patient Details  Name: Jorge Ross MRN: [id number] Date of Birth: 04-06-1941   Condition Code 44 given:  Yes Patient signature on Condition Code 44 notice:  Yes Documentation of 2 MD's agreement:  Yes Code 44 added to claim:  Yes    Carles Collet, RN 06/24/2017, 8:35 AM

## 2017-06-24 NOTE — Care Management Note (Signed)
Case Management Note  Patient Details  Name: Jorge Ross MRN: [id number] Date of Birth: 1940-12-19  Subjective/Objective: Pt presented for Bradycardia- s/p  Pacemaker. 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 Pt has DME Kasandra Knudsen, Civil engineer, contracting and RW. Patient and family would feel better if [facility] PT could visit once at home to do safety evaluation and treatment. Per pt he has used [facility] in the past.                   Action/Plan: Referral made to [facility] and OT not available to be staffed in [place] did make referral to [facility] phone call back. Pt now wants to utilize [place]. Referral made to [facility] and [facility] to begin within 24-48 hours of d/c.   Expected Discharge Date:  06/24/17               Expected Discharge Plan:  [place]  In-House Referral:  NA  Discharge planning Services  CM Consult  Post Acute Care Choice:  Home Health Choice offered to:  Patient  DME Arranged:  N/A DME Agency:  NA  HH Arranged:  PT, OT, Nurse's Aide [place] Agency:   [place]  Status of Service:  Completed, signed off  If discussed at [place] of Stay Meetings, dates discussed:    Additional Comments:  Bethena Roys, RN 06/24/2017, 4:09 PM

## 2017-06-24 NOTE — Progress Notes (Signed)
Pt's son has concerns about pt being d/c. Amber NP, aware and came back to talk to pt and son.

## 2017-06-24 NOTE — Evaluation (Addendum)
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 PMH includes CKD, colon CA, HTN, cervical spine surgery, and L rotator cuff repair.   Clinical Impression  Pt is s/p surgery above with deficits below. PTA, pt was independent with functional mobility. Upon eval, pt presenting with decreased balance, weakness, and generalized fatigue following gait training. Pt's family very concerned about pt's BP and educated it may take time for medicine to take effect. Pt's BP ranged from upper 130s/low70s to mid 150s/low 70s (following gait training) during session. Pt requiring min guard assist for mobility this session. Educated about use of cane at home to increase steadiness. Educated about use of side step technique for stair management and practiced stair navigation with use of RUE. Pt requiring min guard assist. Pt reports wife is at home, however, he sometimes needs to assist her. Educated about precautions to maintain at home and need for supervision with mobility initially. Pt's family continued to be very anxious about d/c and wanting to speak with MD. Notified RN. Recommending HHPT at d/c to increase independence and safety with mobility. Will continue to follow acutely to maximize functional mobility independence and safety.     Follow Up Recommendations Supervision for mobility/OOB;Home health PT    Equipment Recommendations  None recommended by PT    Recommendations for Other Services OT consult     Precautions / Restrictions Precautions Precautions: ICD/Pacemaker Precaution Comments: LUE precautions secondary to pacemaker placement. Reviewed pacemaker precautions with pt and family.  Required Braces or Orthoses: Sling(LUE ) Restrictions Weight Bearing Restrictions: Yes LUE Weight  Bearing: Non weight bearing      Mobility  Bed Mobility Overal bed mobility: Needs Assistance Bed Mobility: Supine to Sit     Supine to sit: Supervision     General bed mobility comments: Supervision for safety.   Transfers Overall transfer level: Needs assistance Equipment used: None Transfers: Sit to/from Stand Sit to Stand: Min guard         General transfer comment: Min guard for safety. Maintained precautions throughout.   Ambulation/Gait Ambulation/Gait assistance: Min guard Ambulation Distance (Feet): 150 Feet Assistive device: None Gait Pattern/deviations: Step-through pattern;Decreased stride length;Drifts right/left Gait velocity: Decreased  Gait velocity interpretation: Below normal speed for age/gender General Gait Details: Slow, guarded gait. Slightly unsteady requiring min guard assist throughout. Discussed use of cane at home to increase steadiness and pt and family agreeable. Pt fatiguing after longer gait distance, and required seated rest.   Stairs Stairs: Yes Stairs assistance: Min guard Stair Management: One rail Right;Sideways;Step to pattern Number of Stairs: 2 General stair comments: Educated about using sideways technique with use of RUE to increase safety with stair navigation. Required min guard assist for safety. Educated pt and family about use of step to technique to increase safety at home and about assist required for stair navigation.   Wheelchair Mobility    Modified Rankin (Stroke Patients Only)       Balance Overall balance assessment: Needs assistance Sitting-balance support: No upper extremity supported;Feet supported Sitting balance-Leahy Scale: Good     Standing balance support: No upper extremity supported;During functional activity Standing balance-Leahy Scale: Fair                               Pertinent  Vitals/Pain Pain Assessment: Faces Faces Pain Scale: Hurts a little bit Pain Location: incision  site  Pain Descriptors / Indicators: Aching;Operative site guarding Pain Intervention(s): Limited activity within patient's tolerance;Monitored during session;Repositioned    Home Living Family/patient expects to be discharged to:: Private residence Living Arrangements: Spouse/significant other Available Help at Discharge: Family;Available 24 hours/day Type of Home: House Home Access: Stairs to enter Entrance Stairs-Rails: Right Entrance Stairs-Number of Steps: 3 Home Layout: Two level Home Equipment: Walker - 2 wheels;Cane - single point      Prior Function Level of Independence: Independent               Hand Dominance        Extremity/Trunk Assessment   Upper Extremity Assessment Upper Extremity Assessment: LUE deficits/detail LUE Deficits / Details: LUE in sling secondary to pacemaker precautions.     Lower Extremity Assessment Lower Extremity Assessment: Generalized weakness    Cervical / Trunk Assessment Cervical / Trunk Assessment: Kyphotic  Communication   Communication: No difficulties  Cognition Arousal/Alertness: Awake/alert Behavior During Therapy: WFL for tasks assessed/performed Overall Cognitive Status: Within Functional Limits for tasks assessed                                        General Comments General comments (skin integrity, edema, etc.): Pt's family very nervous about pt going home, as they report pt's blood pressure is elevated. Checked BP and SBP in mid 140s to low 150s(after gait training) and decreased to upper 130s upon returned to supine. Educated that it may take time for the medicines to take affect to decrease BP. Notified RN. Educated about role of PT and need for supervision at home initially and pt agreeable. Pt's family requesting to speak to MD and notified RN.     Exercises     Assessment/Plan    PT Assessment Patient needs continued PT services  PT Problem List Decreased strength;Decreased  mobility;Decreased balance;Decreased coordination;Decreased knowledge of use of DME;Decreased knowledge of precautions       PT Treatment Interventions DME instruction;Gait training;Stair training;Functional mobility training;Therapeutic activities;Therapeutic exercise;Balance training;Neuromuscular re-education;Patient/family education    PT Goals (Current goals can be found in the Care Plan section)  Acute Rehab PT Goals Patient Stated Goal: to feel better  PT Goal Formulation: With patient Time For Goal Achievement: 07/01/17 Potential to Achieve Goals: Good    Frequency Min 3X/week   Barriers to discharge        Co-evaluation               AM-PAC PT "6 Clicks" Daily Activity  Outcome Measure Difficulty turning over in bed (including adjusting bedclothes, sheets and blankets)?: A Little Difficulty moving from lying on back to sitting on the side of the bed? : A Little Difficulty sitting down on and standing up from a chair with arms (e.g., wheelchair, bedside commode, etc,.)?: Unable Help needed moving to and from a bed to chair (including a wheelchair)?: A Little Help needed walking in hospital room?: A Little Help needed climbing 3-5 steps with a railing? : A Little 6 Click Score: 16    End of Session Equipment Utilized During Treatment: Gait belt;Other (comment)(LUE sling ) Activity Tolerance: Patient tolerated treatment well Patient left: in bed;with call bell/phone within reach;with bed alarm set Nurse Communication: Mobility status PT Visit Diagnosis: Unsteadiness on feet (R26.81);Other abnormalities of gait and mobility (R26.89)  Time: 1343-1410 PT Time Calculation (min) (ACUTE ONLY): 27 min   Charges:   PT Evaluation $PT Eval Low Complexity: 1 Low PT Treatments $Gait Training: 8-22 mins   PT G Codes:   PT G-Codes **NOT FOR INPATIENT CLASS** Functional Assessment Tool Used: AM-PAC 6 Clicks Basic Mobility;Clinical judgement Functional Limitation:  Mobility: Walking and moving around Mobility: Walking and Moving Around Current Status (I3704): At least 40 percent but less than 60 percent impaired, limited or restricted Mobility: Walking and Moving Around Goal Status [phone]): At least 1 percent but less than 20 percent impaired, limited or restricted    Leighton Ruff, PT, DPT  Acute Rehabilitation Services  Pager: [phone] 06/24/2017, 2:38 PM

## 2017-06-24 NOTE — Plan of Care (Signed)
Pt post cardiac pacemaker placement. No complications noted. Site clean and intact. Pt afebrile and receiving IV ABX. Planned CXR and EKG in the am.

## 2017-06-24 NOTE — Progress Notes (Addendum)
Bp in the 140's. Pt would like his home medication for his bp. NP paged.

## 2017-06-24 NOTE — Progress Notes (Signed)
Discharge instructions reviewed with pt. Pt has no questions at this time. IV d/c. Pacemaker site clean and dry. Pt denies any pain or dizziness.

## 2017-06-24 NOTE — Progress Notes (Signed)
Pt ambulated in the hallway. Bp went into the 160's. Pt stated he felt a little dizzy when he got up. Marketing executive. New order received for PO Avapro. Will cont to monitor pt.

## 2017-06-24 NOTE — Care Management Obs Status (Signed)
[place] NOTIFICATION   Patient Details  Name: Jorge Ross MRN: [id number] Date of Birth: 14-Oct-1940   Medicare Observation Status Notification Given:  Yes    Carles Collet, RN 06/24/2017, 8:35 AM

## 2017-06-27 ENCOUNTER — Telehealth: Payer: Self-pay | Admitting: Internal Medicine

## 2017-06-27 NOTE — Telephone Encounter (Signed)
Pt calling to go over generalized questions regarding his pacemaker/defib discharge instructions from the hospital.  Pt wanted to go over his full AVS on caring for his recently placed device.  Spent over 14 minutes with the pt going over the pts instructions on how to care for his device.   Pt verbalized understanding, and all questions and concerns were answered.  Pt more than gracious for all the assistance provided.

## 2017-06-27 NOTE — Telephone Encounter (Signed)
New Message  Pt call requesting to speak with RN about him exercising. Pt would like to speak with RN to ask some questions. Please call back to discuss

## 2017-07-03 ENCOUNTER — Ambulatory Visit (INDEPENDENT_AMBULATORY_CARE_PROVIDER_SITE_OTHER): Payer: Medicare Other | Admitting: *Deleted

## 2017-07-03 DIAGNOSIS — R001 Bradycardia, unspecified: Secondary | ICD-10-CM | POA: Diagnosis not present

## 2017-07-03 LAB — CUP PACEART INCLINIC DEVICE CHECK
Battery Voltage: 3.07 V
Brady Statistic RA Percent Paced: 80 %
Brady Statistic RV Percent Paced: 0.02 %
Implantable Lead Location: 753859
Implantable Lead Model: 5076
Implantable Pulse Generator Implant Date: 20181126
Lead Channel Impedance Value: 487.5 Ohm
Lead Channel Impedance Value: 625 Ohm
Lead Channel Pacing Threshold Amplitude: 0.75 V
Lead Channel Pacing Threshold Pulse Width: 0.5 ms
Lead Channel Sensing Intrinsic Amplitude: 12 mV
Lead Channel Sensing Intrinsic Amplitude: 4.5 mV
Lead Channel Setting Pacing Amplitude: 3.5 V
Lead Channel Setting Pacing Pulse Width: 0.5 ms
MDC IDC LEAD IMPLANT DT: 20181126
MDC IDC LEAD IMPLANT DT: 20181126
MDC IDC LEAD LOCATION: 753860
MDC IDC MSMT BATTERY REMAINING LONGEVITY: 88 mo
MDC IDC MSMT LEADCHNL RA PACING THRESHOLD PULSEWIDTH: 0.5 ms
MDC IDC MSMT LEADCHNL RV PACING THRESHOLD AMPLITUDE: 1 V
MDC IDC SESS DTM: 20181206115809
MDC IDC SET LEADCHNL RV PACING AMPLITUDE: 3.5 V
MDC IDC SET LEADCHNL RV SENSING SENSITIVITY: 2 mV
Pulse Gen Serial Number: 8971004

## 2017-07-03 NOTE — Progress Notes (Signed)
Wound check appointment. Steri-strips removed. Wound without redness or edema. Incision edges approximated, wound well healed. Normal device function. Thresholds, sensing, and impedances consistent with implant measurements. Device programmed at 3.5V on for extra safety margin until 3 month visit. Histogram distribution appropriate for patient and level of activity. 1 mode switches (<0.1%)-- EGM appears AT, duration 6 seconds, Peak A 173 bpm, Peak V rate 93 bpm. No high ventricular rates noted. Patient educated about wound care, arm mobility, lifting restrictions. ROV with SK 09/24/17.

## 2017-08-25 ENCOUNTER — Telehealth: Payer: Self-pay | Admitting: Internal Medicine

## 2017-08-25 NOTE — Telephone Encounter (Signed)
New message   Patient c/o Palpitations:  High priority if patient c/o lightheadedness, shortness of breath, or chest pain  1) How long have you had palpitations/irregular HR/ Afib? Are you having the symptoms now? 2 weeks  2) Are you currently experiencing lightheadedness, SOB or CP? Lightheaded, after exercising SOB  3) Do you have a history of afib (atrial fibrillation) or irregular heart rhythm? No  4) Have you checked your BP or HR? (document readings if available): No  5) Are you experiencing any other symptoms? No

## 2017-08-25 NOTE — Telephone Encounter (Signed)
Called patient and requested that he send a manual transmission. Verbal instructions given. Will call patient once it's received.

## 2017-08-25 NOTE — Telephone Encounter (Signed)
Remote transmission received. Presenting rhythm: ApVs. <1% AT/AF burden, max dur. 24sec, last 07/09/17. Normal device function. Histogram distribution appropriate for activity level.   Called patient back with the results of his remote transmission. I also spoke to patient about his sx's. Patient states that he notices that he's been getting lightheaded at random times throughout the day. He specifically states that he notices the lightheadedness when he "gets excited". Patient states that he has also noticed some visual changes associated with the lightheadedness. He denies any other sx's such as CP or ShOB. Patient states that he took his BP today and had a systolic reading of 275TZGY, which is high for him. He says that he doesn't normally take his BP.   I encouraged patient to take his BP x 1 week to see if it's elevated and stays elevated. I told patient to call back with the results. Patient verbalized understanding.  Patient also stated that he received a letter from his pharmacy stating that the Avalide that he's taking is recalled. He said that they have an alternative, but he hasn't called the pharmacy to see what it is. I encouraged patient to call the pharmacy about the Rx. Again, patient verbalized understanding.

## 2017-09-17 ENCOUNTER — Encounter: Payer: Self-pay | Admitting: Internal Medicine

## 2017-09-24 ENCOUNTER — Encounter: Payer: Self-pay | Admitting: Internal Medicine

## 2017-09-24 ENCOUNTER — Ambulatory Visit: Payer: Medicare Other | Admitting: Internal Medicine

## 2017-09-24 VITALS — BP 118/70 | HR 60 | Ht 72.0 in | Wt 191.6 lb

## 2017-09-24 DIAGNOSIS — R001 Bradycardia, unspecified: Secondary | ICD-10-CM

## 2017-09-24 DIAGNOSIS — Z950 Presence of cardiac pacemaker: Secondary | ICD-10-CM | POA: Diagnosis not present

## 2017-09-24 NOTE — Progress Notes (Signed)
Patient Care Team: [name], MD as PCP - General (Internal Medicine) Stark Klein, MD as Referring Physician (General Surgery) Wyatt Portela, MD as Consulting Physician (Oncology)   HPI  Jorge Ross is a 77 y.o. male Seen in follow-up for pacemaker implanted 11/18 for sinus node dysfunction and syncope in the context of left bundle branch block.\  No intercurrent syncope.  He has had lightheadedness with bending.  He saw his PCP today who ordered some blood work.  Records and Results Reviewed  Past Medical History:  Diagnosis Date  . Arthritis    HIPS  . Chronic kidney disease   . Colon cancer ([place]) 2015  . Hypertension   . 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 Unspecified disorder of intestine     Past Surgical History:  Procedure Laterality Date  . CERVICAL SPINE SURGERY     3 titamum screws  . COLECTOMY  03-2014  . COLON SURGERY  04/01/14  . COLONOSCOPY  2007   normal   . CYSTOSCOPY     03-15-16  . HERNIA REPAIR Bilateral    x 3 total   . PACEMAKER IMPLANT N/A 06/23/2017   Procedure: PACEMAKER IMPLANT;  Surgeon: Deboraha Sprang, MD;  Location: [facility];  Service: Cardiovascular;  Laterality: N/A;  . PORT-A-CATH REMOVAL N/A 02/21/2016   Procedure: REMOVAL PORT-A-CATH;  Surgeon: Stark Klein, MD;  Location: [facility];  Service: General;  Laterality: N/A;  . PORTACATH PLACEMENT N/A 05/24/2014   Procedure: INSERTION PORT-A-CATH;  Surgeon: Stark Klein, MD;  Location: [facility];  Service: General;  Laterality: N/A;  . ROBOT ASSISTED LAPAROSCOPIC NEPHRECTOMY Left 04/01/2014   Procedure: ROBOTIC ASSISTED LAPAROSCOPIC LEFT PARTIAL NEPHRECTOMY;  Surgeon: Alexis Frock, MD;  Location: [facility];  Service: Urology;  Laterality: Left;  . ROTATOR CUFF REPAIR Left   . thumb ligament surgery Right     Current Outpatient Medications  Medication Sig Dispense Refill  . aspirin 81 MG tablet Take 81 mg by mouth daily.      .  irbesartan-hydrochlorothiazide (AVALIDE) 150-12.5 MG per tablet Take 1 tablet by mouth every morning.     . Multiple Vitamin (MULTIVITAMIN) capsule Take 1 capsule by mouth daily.      . naproxen sodium (ANAPROX) 220 MG tablet Take 220 mg by mouth 2 (two) times daily with a meal.    . vitamin E 400 UNIT capsule Take 400 Units by mouth daily.      No current facility-administered medications for this visit.     Allergies  Allergen Reactions  . Codeine Other (See Comments)    constipation      Review of Systems negative except from HPI and PMH  Physical Exam BP 118/70   Pulse 60   Ht 6' (1.829 m)   Wt 191 lb 9.6 oz (86.9 kg)   SpO2 97%   BMI 25.99 kg/m  Well developed and well nourished in no acute distress HENT normal E scleral and icterus clear Neck Supple JVP flat; carotids brisk and full Regular rate and rhythm, no murmurs gallops or rub Soft with active bowel sounds No clubbing cyanosis  Edema Alert and oriented, grossly normal motor and sensory function Skin Warm and Dry  ECG demonstrates A pacing with intrinsic ventricular conduction with left bundle branch block  Assessment and  Plan  Syncope  Sinus node dysfunction  Left bundle branch block  Hypertension  Dizziness  Pacemaker-[employer] Jude  Patient's blood pressure is quite low.  He has having some orthostasis.  I have asked him to decrease his Avalide by cutting it in half.  CBC will be followed by his PCP.  Device function is normal.  No interval syncope     Current medicines are reviewed at length with the patient today .  The patient does not  have concerns regarding medicines.

## 2017-09-24 NOTE — Patient Instructions (Addendum)
Medication Instructions:  Your physician recommends that you continue on your current medications as directed. Please refer to the Current Medication list given to you today.  Labwork: None ordered.  Testing/Procedures: None ordered.  Follow-Up: Your physician recommends that you schedule a follow-up appointment in: One Year with Dr Caryl Comes  Remote monitoring is used to monitor your ICD from home. This monitoring reduces the number of office visits required to check your device to one time per year. It allows [place] to keep an eye on the functioning of your device to ensure it is working properly. You are scheduled for a device check from home on 12/24/2017. You may send your transmission at any time that day. If you have a wireless device, the transmission will be sent automatically. After your physician reviews your transmission, you will receive a postcard with your next transmission date.   Any Other Special Instructions Will Be Listed Below (If Applicable).     If you need a refill on your cardiac medications before your next appointment, please call your pharmacy.

## 2017-11-13 LAB — CUP PACEART INCLINIC DEVICE CHECK
Battery Remaining Longevity: 122 mo
Battery Voltage: 3.01 V
Brady Statistic RA Percent Paced: 79 %
Brady Statistic RV Percent Paced: 0.33 %
Implantable Lead Implant Date: 20181126
Implantable Lead Location: 753859
Implantable Lead Model: 5076
Implantable Lead Model: 5076
Lead Channel Pacing Threshold Amplitude: 0.5 V
Lead Channel Sensing Intrinsic Amplitude: 12 mV
Lead Channel Sensing Intrinsic Amplitude: 4.3 mV
Lead Channel Setting Pacing Amplitude: 2.5 V
Lead Channel Setting Pacing Pulse Width: 0.5 ms
MDC IDC LEAD IMPLANT DT: 20181126
MDC IDC LEAD LOCATION: 753860
MDC IDC MSMT LEADCHNL RA IMPEDANCE VALUE: 525 Ohm
MDC IDC MSMT LEADCHNL RA PACING THRESHOLD PULSEWIDTH: 0.5 ms
MDC IDC MSMT LEADCHNL RV IMPEDANCE VALUE: 662.5 Ohm
MDC IDC MSMT LEADCHNL RV PACING THRESHOLD AMPLITUDE: 0.75 V
MDC IDC MSMT LEADCHNL RV PACING THRESHOLD PULSEWIDTH: 0.5 ms
MDC IDC PG IMPLANT DT: 20181126
MDC IDC PG SERIAL: 8971004
MDC IDC SESS DTM: 20190227175557
MDC IDC SET LEADCHNL RA PACING AMPLITUDE: 2 V
MDC IDC SET LEADCHNL RV SENSING SENSITIVITY: 2 mV

## 2017-12-18 ENCOUNTER — Telehealth: Payer: Self-pay | Admitting: Internal Medicine

## 2017-12-18 NOTE — Telephone Encounter (Signed)
   Primary Cardiologist: Virl Axe, MD  Chart reviewed as part of pre-operative protocol coverage. Given past medical history and time since last visit, based on ACC/AHA guidelines, Jorge Ross would be at acceptable risk for the planned procedure without further cardiovascular testing.   I will route this recommendation to the requesting party via Epic fax function and remove from pre-op pool.  Please call with questions.  This is a very low risk procedure. Patient does not have prior valve procedure, no need for SBE prophylaxis.   [place], [place] 12/18/2017, 5:45 PM

## 2017-12-18 NOTE — Telephone Encounter (Signed)
New Message      [facility] Pre-operative Risk Assessment    Request for surgical clearance:  1. What type of surgery is being performed? Dental Cleansing   2. When is this surgery scheduled? TBD   3. What type of clearance is required (medical clearance vs. Pharmacy clearance to hold med vs. Both)? Both  4. Are there any medications that need to be held prior to surgery and how long? Provider recommendation as well as if any pre-meds are needed   5. Practice name and name of physician performing surgery?  [facility] (practice name and name of provider)   6. What is your office phone number [phone]    7.   What is your office fax number [phone]   8.   Anesthesia type (None, local, MAC, general) ?  None    Jorge Ross 12/18/2017, 11:59 AM  _________________________________________________________________   (provider comments below)

## 2017-12-24 ENCOUNTER — Ambulatory Visit (INDEPENDENT_AMBULATORY_CARE_PROVIDER_SITE_OTHER): Payer: Medicare Other | Admitting: *Deleted

## 2017-12-24 DIAGNOSIS — I442 Atrioventricular block, complete: Secondary | ICD-10-CM

## 2017-12-24 DIAGNOSIS — R55 Syncope and collapse: Secondary | ICD-10-CM

## 2017-12-24 NOTE — Progress Notes (Signed)
Remote pacemaker transmission.   

## 2018-01-05 ENCOUNTER — Inpatient Hospital Stay: Payer: Medicare Other

## 2018-01-06 ENCOUNTER — Ambulatory Visit (HOSPITAL_COMMUNITY)
Admission: RE | Admit: 2018-01-06 | Discharge: 2018-01-06 | Disposition: A | Discharge status: Home / Self Care | Payer: Medicare Other | Source: Ambulatory Visit | Attending: Oncology | Admitting: Oncology

## 2018-01-06 ENCOUNTER — Encounter (HOSPITAL_COMMUNITY): Payer: Self-pay

## 2018-01-06 ENCOUNTER — Inpatient Hospital Stay: Payer: Medicare Other | Attending: Oncology

## 2018-01-06 DIAGNOSIS — Z85528 Personal history of other malignant neoplasm of kidney: Secondary | ICD-10-CM | POA: Insufficient documentation

## 2018-01-06 DIAGNOSIS — C189 Malignant neoplasm of colon, unspecified: Secondary | ICD-10-CM | POA: Insufficient documentation

## 2018-01-06 DIAGNOSIS — G620 Drug-induced polyneuropathy: Secondary | ICD-10-CM | POA: Diagnosis not present

## 2018-01-06 DIAGNOSIS — Z85038 Personal history of other malignant neoplasm of large intestine: Secondary | ICD-10-CM | POA: Insufficient documentation

## 2018-01-06 LAB — CBC WITH DIFFERENTIAL/PLATELET
BASOS PCT: 1 %
Basophils Absolute: 0 10*3/uL (ref 0.0–0.1)
EOS ABS: 0.1 10*3/uL (ref 0.0–0.5)
Eosinophils Relative: 1 %
HEMATOCRIT: 39.9 % (ref 38.4–49.9)
HEMOGLOBIN: 13.7 g/dL (ref 13.0–17.1)
LYMPHS ABS: 1.7 10*3/uL (ref 0.9–3.3)
Lymphocytes Relative: 42 %
MCH: 32.8 pg (ref 27.2–33.4)
MCHC: 34.3 g/dL (ref 32.0–36.0)
MCV: 95.5 fL (ref 79.3–98.0)
MONOS PCT: 12 %
Monocytes Absolute: 0.5 10*3/uL (ref 0.1–0.9)
NEUTROS ABS: 1.9 10*3/uL (ref 1.5–6.5)
NEUTROS PCT: 44 %
Platelets: 147 10*3/uL (ref 140–400)
RBC: 4.18 MIL/uL — AB (ref 4.20–5.82)
RDW: 12.8 % (ref 11.0–14.6)
WBC: 4.2 10*3/uL (ref 4.0–10.3)

## 2018-01-06 LAB — COMPREHENSIVE METABOLIC PANEL
ALBUMIN: 4.3 g/dL (ref 3.5–5.0)
ALK PHOS: 61 U/L (ref 40–150)
ALT: 15 U/L (ref 0–55)
ANION GAP: 9 (ref 3–11)
AST: 18 U/L (ref 5–34)
BILIRUBIN TOTAL: 0.7 mg/dL (ref 0.2–1.2)
BUN: 19 mg/dL (ref 7–26)
CALCIUM: 9.5 mg/dL (ref 8.4–10.4)
CO2: 27 mmol/L (ref 22–29)
CREATININE: 0.95 mg/dL (ref 0.70–1.30)
Chloride: 103 mmol/L (ref 98–109)
GFR calc Af Amer: >60 mL/min (ref >60)
GFR calc non Af Amer: >60 mL/min (ref >60)
GLUCOSE: 94 mg/dL (ref 70–140)
Potassium: 4.1 mmol/L (ref 3.5–5.1)
SODIUM: 139 mmol/L (ref 136–145)
TOTAL PROTEIN: 7.3 g/dL (ref 6.4–8.3)

## 2018-01-06 MED ORDER — IOPAMIDOL (ISOVUE-300) INJECTION 61%
INTRAVENOUS | Status: AC
  Filled 2018-01-06: qty 100

## 2018-01-06 MED ORDER — IOPAMIDOL (ISOVUE-300) INJECTION 61%
100.0000 mL | Freq: Once | INTRAVENOUS | Status: AC | PRN
  Administered 2018-01-06: 100 mL via INTRAVENOUS

## 2018-01-13 ENCOUNTER — Telehealth: Payer: Self-pay | Admitting: Oncology

## 2018-01-13 ENCOUNTER — Inpatient Hospital Stay (HOSPITAL_BASED_OUTPATIENT_CLINIC_OR_DEPARTMENT_OTHER): Payer: Medicare Other | Admitting: Oncology

## 2018-01-13 VITALS — BP 143/69 | HR 77 | Temp 97.9°F | Resp 18 | Ht 72.0 in | Wt 186.3 lb

## 2018-01-13 DIAGNOSIS — C189 Malignant neoplasm of colon, unspecified: Secondary | ICD-10-CM

## 2018-01-13 DIAGNOSIS — G620 Drug-induced polyneuropathy: Secondary | ICD-10-CM | POA: Diagnosis not present

## 2018-01-13 DIAGNOSIS — Z85528 Personal history of other malignant neoplasm of kidney: Secondary | ICD-10-CM

## 2018-01-13 DIAGNOSIS — Z85038 Personal history of other malignant neoplasm of large intestine: Secondary | ICD-10-CM

## 2018-01-13 LAB — CUP PACEART REMOTE DEVICE CHECK
Brady Statistic AP VP Percent: 1 %
Brady Statistic AP VS Percent: 82 %
Brady Statistic AS VP Percent: 1 %
Brady Statistic AS VS Percent: 18 %
Brady Statistic RV Percent Paced: 1 %
Date Time Interrogation Session: 20190529064838
Implantable Lead Implant Date: 20181126
Implantable Lead Location: 753860
Implantable Lead Model: 5076
Lead Channel Impedance Value: 480 Ohm
Lead Channel Pacing Threshold Amplitude: 0.75 V
Lead Channel Pacing Threshold Pulse Width: 0.5 ms
Lead Channel Sensing Intrinsic Amplitude: 12 mV
Lead Channel Setting Pacing Amplitude: 2 V
Lead Channel Setting Pacing Amplitude: 2.5 V
Lead Channel Setting Pacing Pulse Width: 0.5 ms
Lead Channel Setting Sensing Sensitivity: 2 mV
MDC IDC LEAD IMPLANT DT: 20181126
MDC IDC LEAD LOCATION: 753859
MDC IDC MSMT BATTERY REMAINING LONGEVITY: 112 mo
MDC IDC MSMT BATTERY REMAINING PERCENTAGE: 95.5 %
MDC IDC MSMT BATTERY VOLTAGE: 3.01 V
MDC IDC MSMT LEADCHNL RA PACING THRESHOLD AMPLITUDE: 0.5 V
MDC IDC MSMT LEADCHNL RA PACING THRESHOLD PULSEWIDTH: 0.5 ms
MDC IDC MSMT LEADCHNL RA SENSING INTR AMPL: 4.6 mV
MDC IDC MSMT LEADCHNL RV IMPEDANCE VALUE: 600 Ohm
MDC IDC PG IMPLANT DT: 20181126
MDC IDC STAT BRADY RA PERCENT PACED: 82 %
Pulse Gen Model: 2272
Pulse Gen Serial Number: 8971004

## 2018-01-13 NOTE — Telephone Encounter (Signed)
Appointments scheduled , contrast material with instructions given/ avs/calendar printed per 6/18 los

## 2018-01-13 NOTE — Progress Notes (Signed)
Hematology and Oncology Follow Up Visit  Jorge Ross [id number] 1940/10/13 77 y.o. 01/13/2018 10:27 AM Polite, [name], MDClark, Don Broach, MD   Principle Diagnosis: 77 year old with stage III (T1N1) colon cancer diagnosed in September 2015.   Prior Therapy:  He is status post right colectomy done laparoscopically on 04/01/2014. He had a T1 N1 disease was 0 out of 12 lymph nodes involved.   He also status post partial nephrectomy on 04/01/2014 for a papillary tumor.  FOLFOX adjuvant chemotherapy cycle 1 to given on 05/31/2014. Status post 11 cycles completed on 11/28/2014.  Current therapy: Active surveillance.  Interim History:  Jorge Ross is here for a follow-up visit.  Since last visit, he reports he developed syncope and required pacemaker placement.  He recovered reasonably well from that episode and had regained all activities of daily living.  He denies any changes in his bowel habits including hematochezia or melena.  He remains active and attends to activities of daily living.  He denies any abdominal distention or early satiety.  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    He does not report any headaches, blurry vision, syncope.  He denies any changes in his mental status or confusion.  He denies any fevers or chills or sweats.  He does not report any chest pain, shortness of breath, cough. He does not report any lower extremity edema or palpitation. He does not report any nausea, vomiting, constipation, diarrhea or hematochezia. He does not report any frequency, urgency or hesitancy. He does not report any arthralgias or myalgias.  He denies any lymphadenopathy or petechiae.  He denies any easy bruising or rashes.  Remainder of his review of systems is negative.  Medications: I have reviewed the patient's current medications.  Current Outpatient Medications  Medication Sig Dispense Refill  . aspirin 81 MG tablet Take 81 mg  by mouth daily.      . irbesartan-hydrochlorothiazide (AVALIDE) 150-12.5 MG per tablet Take 1 tablet by mouth every morning.     . Multiple Vitamin (MULTIVITAMIN) capsule Take 1 capsule by mouth daily.      . naproxen sodium (ANAPROX) 220 MG tablet Take 220 mg by mouth 2 (two) times daily with a meal.    . vitamin E 400 UNIT capsule Take 400 Units by mouth daily.      No current facility-administered medications for this visit.      Allergies:  Allergies  Allergen Reactions  . Codeine Other (See Comments)    constipation    Past Medical History, Surgical history, Social history, and Family History were reviewed and updated.   Physical Exam: Blood pressure (!) 143/69, pulse 77, temperature 97.9 F (36.6 C), temperature source Oral, resp. rate 18, height 6' (1.829 m), weight 186 lb 4.8 oz (84.5 kg), SpO2 99 %. ECOG: 0 General appearance: Alert, awake gentleman without distress. Head: Atraumatic without abnormalities Oropharynx: Without any thrush or ulcers. Eyes: No scleral icterus.  Pupils are equal and round reactive to light. Lymph nodes: No lymphadenopathy noted in the cervical, supraclavicular, or axillary nodes  Heart:regular rate and rhythm, without any murmurs.  No leg edema. Lung: There to auscultation without any rhonchi, wheezes or dullness to percussion. Abdomin: Soft, nontender without any rebound or guarding.. Muscular skeletal: No joint deformity or effusion. Skin: No ecchymosis or petechiae.  Lab Results: Lab Results  Component Value Date   WBC 4.2 01/06/2018   HGB 13.7 01/06/2018   HCT 39.9 01/06/2018   MCV 95.5  01/06/2018   PLT 147 01/06/2018     Chemistry      Component Value Date/Time   NA 139 01/06/2018 0951   NA 140 01/02/2017 0954   K 4.1 01/06/2018 0951   K 4.2 01/02/2017 0954   CL 103 01/06/2018 0951   CO2 27 01/06/2018 0951   CO2 28 01/02/2017 0954   BUN 19 01/06/2018 0951   BUN 26.4 (H) 01/02/2017 0954   CREATININE 0.95 01/06/2018 0951    CREATININE 1.1 01/02/2017 0954      Component Value Date/Time   CALCIUM 9.5 01/06/2018 0951   CALCIUM 9.7 01/02/2017 0954   ALKPHOS 61 01/06/2018 0951   ALKPHOS 65 01/02/2017 0954   AST 18 01/06/2018 0951   AST 19 01/02/2017 0954   ALT 15 01/06/2018 0951   ALT 17 01/02/2017 0954   BILITOT 0.7 01/06/2018 0951   BILITOT 0.75 01/02/2017 0954     EXAM: CT CHEST, ABDOMEN, AND PELVIS WITH CONTRAST  TECHNIQUE: Multidetector CT imaging of the chest, abdomen and pelvis was performed following the standard protocol during bolus administration of intravenous contrast.  CONTRAST:  153mL ISOVUE-300 IOPAMIDOL (ISOVUE-300) INJECTION 61%  COMPARISON:  CT scan 01/02/2017  FINDINGS: CT CHEST FINDINGS  Cardiovascular: The heart is normal in size. No pericardial effusion. Pacer wires in good position without complicating features. The aorta is normal in caliber. No dissection or significant atherosclerotic calcifications. Stable calcified ductus diverticulum.  Moderate chest wall collaterals are noted possibly due to pinching of the subclavian vein as it crosses over the first rib.  Mediastinum/Nodes: No mediastinal or hilar mass or adenopathy. Small scattered lymph nodes are stable. The esophagus is grossly normal.  Lungs/Pleura: The lungs are clear of an acute process. No worrisome pulmonary nodules to suggest pulmonary metastatic disease. No pleural effusion or pleural nodules.  Musculoskeletal: No chest wall mass, supraclavicular or axillary lymphadenopathy. No worrisome bone lesions.  CT ABDOMEN PELVIS FINDINGS  Hepatobiliary: No focal hepatic lesions to suggest metastatic disease. The gallbladder is normal. No common bile duct dilatation.  Pancreas: No mass, inflammation or ductal dilatation.  Spleen: Normal size.  No focal lesions.  Adrenals/Urinary Tract: Stable small left adrenal gland nodule, likely benign adenoma. The right adrenal gland is  normal.  The kidneys are unremarkable. Remote surgical changes from a partial left nephrectomy. No worrisome renal lesions. Simple appearing renal cysts. No hydronephrosis. The delayed images do not demonstrate any collecting system abnormalities. The ureters and bladder are unremarkable.  Stomach/Bowel: The stomach, duodenum, small bowel and colon are unremarkable. No acute inflammatory changes, mass lesions or obstructive findings. Status post partial right hemicolectomy. The ileocolonic anastomosis appears normal.  Vascular/Lymphatic: The aorta and branch vessels are patent. The major venous structures are patent. No mesenteric or retroperitoneal mass or adenopathy.  Reproductive: The prostate gland and seminal vesicles are unremarkable.  Other: No pelvic mass or adenopathy. No free pelvic fluid collections. No inguinal mass or adenopathy. Small periumbilical abdominal wall hernia containing fat. Surgical changes noted in the left rectus muscle. No inguinal mass, adenopathy or hernia.  Musculoskeletal: No significant bony findings. There are few scattered stable sclerotic bone lesions involving the pelvis which are likely benign bone islands. Advanced degenerate disc disease noted at L5-S1.  IMPRESSION: 1. Stable CT appearance of the chest, abdomen and pelvis. No findings for recurrent tumor or metastatic disease. 2. Pacer wires in good position without complicating features. 3. Chest wall collaterals may be due to pinching of the left subclavian vein as it courses  over the first rib. 4. No acute abdominal/pelvic findin    Impression and Plan:   76 year old man with:  1.  Stage III adenocarcinoma of the colon diagnosed in 2015.  He received surgical resection followed by adjuvant chemotherapy.  The natural course of this disease was reviewed today and he continues to show no evidence of disease recurrence.  CT scan obtained on 01/06/2018 was personally reviewed  and discussed with the patient today.  He continues to show no evidence of disease relapse and no further therapy is needed.  I recommended continued observation and surveillance and repeat laboratory testing and imaging studies in 1 year.  No further testing will be needed after that.  2. Cardiac arrhythmia: Pacemaker was placed at this time.  He continues to follow with cardiology regarding this issue.  3. Renal cell carcinoma: He had a T1 lesion that was removed close to 5 years out at this time.  We will continue to repeat imaging studies annually at this time.  4. Colonoscopy surveillance: His next colonoscopy scheduled for 1 year.  I urged him to continue to follow regarding this issue.  5. 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  Not interfering with his function and not painful in nature.  I recommended continued observation and surveillance.  6. Follow-up will be in 12 months for a clinical visit and repeat imaging studies in 4 months.  15  minutes was spent with the patient face-to-face today.  More than 50% of time was dedicated to patient counseling, education and coordination of his care.   Zola Button, MD 6/18/201910:27 AM

## 2018-03-25 ENCOUNTER — Ambulatory Visit (INDEPENDENT_AMBULATORY_CARE_PROVIDER_SITE_OTHER): Payer: Medicare Other | Admitting: *Deleted

## 2018-03-25 DIAGNOSIS — R55 Syncope and collapse: Secondary | ICD-10-CM

## 2018-03-25 DIAGNOSIS — I442 Atrioventricular block, complete: Secondary | ICD-10-CM

## 2018-03-25 NOTE — Progress Notes (Signed)
Remote pacemaker transmission.   

## 2018-04-27 LAB — CUP PACEART REMOTE DEVICE CHECK
Battery Remaining Longevity: 109 mo
Battery Remaining Percentage: 95.5 %
Battery Voltage: 3.01 V
Brady Statistic AP VP Percent: 1 %
Brady Statistic AP VS Percent: 83 %
Brady Statistic AS VS Percent: 16 %
Date Time Interrogation Session: 20190828060014
Implantable Lead Implant Date: 20181126
Implantable Lead Location: 753860
Implantable Pulse Generator Implant Date: 20181126
Lead Channel Impedance Value: 480 Ohm
Lead Channel Impedance Value: 550 Ohm
Lead Channel Pacing Threshold Amplitude: 0.5 V
Lead Channel Pacing Threshold Amplitude: 0.75 V
Lead Channel Pacing Threshold Pulse Width: 0.5 ms
Lead Channel Sensing Intrinsic Amplitude: 4.6 mV
Lead Channel Setting Pacing Amplitude: 2.5 V
Lead Channel Setting Pacing Pulse Width: 0.5 ms
MDC IDC LEAD IMPLANT DT: 20181126
MDC IDC LEAD LOCATION: 753859
MDC IDC MSMT LEADCHNL RA PACING THRESHOLD PULSEWIDTH: 0.5 ms
MDC IDC MSMT LEADCHNL RV SENSING INTR AMPL: 12 mV
MDC IDC PG SERIAL: 8971004
MDC IDC SET LEADCHNL RA PACING AMPLITUDE: 2 V
MDC IDC SET LEADCHNL RV SENSING SENSITIVITY: 2 mV
MDC IDC STAT BRADY AS VP PERCENT: 1 %
MDC IDC STAT BRADY RA PERCENT PACED: 83 %
MDC IDC STAT BRADY RV PERCENT PACED: 1 %
Pulse Gen Model: 2272

## 2018-06-24 ENCOUNTER — Ambulatory Visit (INDEPENDENT_AMBULATORY_CARE_PROVIDER_SITE_OTHER): Payer: Medicare Other

## 2018-06-24 DIAGNOSIS — I442 Atrioventricular block, complete: Secondary | ICD-10-CM

## 2018-06-24 DIAGNOSIS — R55 Syncope and collapse: Secondary | ICD-10-CM

## 2018-06-24 NOTE — Progress Notes (Signed)
Remote pacemaker transmission.   

## 2018-08-14 LAB — CUP PACEART REMOTE DEVICE CHECK
Battery Remaining Longevity: 109 mo
Battery Remaining Percentage: 95.5 %
Brady Statistic AP VS Percent: 83 %
Brady Statistic RA Percent Paced: 83 %
Brady Statistic RV Percent Paced: 1 %
Date Time Interrogation Session: 20191127121622
Implantable Lead Implant Date: 20181126
Implantable Lead Implant Date: 20181126
Implantable Lead Location: 753859
Implantable Lead Location: 753860
Implantable Lead Model: 5076
Implantable Pulse Generator Implant Date: 20181126
Lead Channel Pacing Threshold Amplitude: 0.5 V
Lead Channel Pacing Threshold Amplitude: 0.75 V
Lead Channel Pacing Threshold Pulse Width: 0.5 ms
Lead Channel Sensing Intrinsic Amplitude: 4.2 mV
Lead Channel Setting Pacing Amplitude: 2 V
Lead Channel Setting Pacing Pulse Width: 0.5 ms
Lead Channel Setting Sensing Sensitivity: 2 mV
MDC IDC MSMT BATTERY VOLTAGE: 3.01 V
MDC IDC MSMT LEADCHNL RA IMPEDANCE VALUE: 490 Ohm
MDC IDC MSMT LEADCHNL RV IMPEDANCE VALUE: 560 Ohm
MDC IDC MSMT LEADCHNL RV PACING THRESHOLD PULSEWIDTH: 0.5 ms
MDC IDC MSMT LEADCHNL RV SENSING INTR AMPL: 12 mV
MDC IDC SET LEADCHNL RV PACING AMPLITUDE: 2.5 V
MDC IDC STAT BRADY AP VP PERCENT: 1 %
MDC IDC STAT BRADY AS VP PERCENT: 1 %
MDC IDC STAT BRADY AS VS PERCENT: 17 %
Pulse Gen Model: 2272
Pulse Gen Serial Number: 8971004

## 2018-09-22 ENCOUNTER — Encounter: Payer: Medicare Other | Admitting: Internal Medicine

## 2018-09-23 ENCOUNTER — Encounter: Payer: Self-pay | Admitting: Internal Medicine

## 2018-09-23 ENCOUNTER — Ambulatory Visit (INDEPENDENT_AMBULATORY_CARE_PROVIDER_SITE_OTHER): Payer: Medicare Other | Admitting: *Deleted

## 2018-09-23 DIAGNOSIS — R55 Syncope and collapse: Secondary | ICD-10-CM | POA: Diagnosis not present

## 2018-09-23 DIAGNOSIS — I442 Atrioventricular block, complete: Secondary | ICD-10-CM

## 2018-09-24 ENCOUNTER — Telehealth: Payer: Self-pay

## 2018-09-24 NOTE — Telephone Encounter (Signed)
Spoke with patient to remind of missed remote transmission 

## 2018-09-25 LAB — CUP PACEART REMOTE DEVICE CHECK
Battery Remaining Longevity: 114 mo
Battery Voltage: 2.99 V
Brady Statistic AS VP Percent: 1 %
Brady Statistic RA Percent Paced: 82 %
Implantable Lead Implant Date: 20181126
Implantable Lead Location: 753859
Implantable Lead Model: 5076
Implantable Lead Model: 5076
Lead Channel Impedance Value: 480 Ohm
Lead Channel Pacing Threshold Amplitude: 0.5 V
Lead Channel Pacing Threshold Pulse Width: 0.5 ms
Lead Channel Setting Pacing Amplitude: 2 V
MDC IDC LEAD IMPLANT DT: 20181126
MDC IDC LEAD LOCATION: 753860
MDC IDC MSMT BATTERY REMAINING PERCENTAGE: 95.5 %
MDC IDC MSMT LEADCHNL RA SENSING INTR AMPL: 4 mV
MDC IDC MSMT LEADCHNL RV IMPEDANCE VALUE: 490 Ohm
MDC IDC MSMT LEADCHNL RV PACING THRESHOLD AMPLITUDE: 0.75 V
MDC IDC MSMT LEADCHNL RV PACING THRESHOLD PULSEWIDTH: 0.5 ms
MDC IDC MSMT LEADCHNL RV SENSING INTR AMPL: 12 mV
MDC IDC PG IMPLANT DT: 20181126
MDC IDC PG SERIAL: 8971004
MDC IDC SESS DTM: 20200227144603
MDC IDC SET LEADCHNL RV PACING AMPLITUDE: 2.5 V
MDC IDC SET LEADCHNL RV PACING PULSEWIDTH: 0.5 ms
MDC IDC SET LEADCHNL RV SENSING SENSITIVITY: 2 mV
MDC IDC STAT BRADY AP VP PERCENT: 1 %
MDC IDC STAT BRADY AP VS PERCENT: 82 %
MDC IDC STAT BRADY AS VS PERCENT: 17 %
MDC IDC STAT BRADY RV PERCENT PACED: 1 %

## 2018-09-30 NOTE — Progress Notes (Signed)
Remote pacemaker transmission.   

## 2018-10-13 ENCOUNTER — Telehealth: Payer: Self-pay

## 2018-10-13 ENCOUNTER — Telehealth: Payer: Self-pay | Admitting: Internal Medicine

## 2018-10-13 NOTE — Telephone Encounter (Signed)
Called and spoke to pt regarding appointment next week already canceled   Has PM in place for heart block syncope and LBBB  Currently symptoms are The patient denies chest pain, shortness of breath, nocturnal dyspnea, orthopnea or peripheral edema.  There have been no palpitations, lightheadedness or syncope.    Discussed rescheduling of the appt 6-8  weeks from now  Pt is agreeable and advised to call if interval problems

## 2018-10-13 NOTE — Telephone Encounter (Signed)
Spoke with pt to assess his needs and to reschedule his appt due to COVID-19 outbreak. Pt states he would prefer to keep his appt. He has c/o "something twitching in his chest." I advised him to keep his risk low, I will have Dr Caryl Comes call him to further assess. He agrees to this.   He agrees to have his appointment rescheduled as well.

## 2018-10-19 ENCOUNTER — Encounter: Payer: Medicare Other | Admitting: Internal Medicine

## 2018-10-27 ENCOUNTER — Telehealth: Payer: Self-pay

## 2018-10-27 NOTE — Telephone Encounter (Signed)
Called and left patient a message about setting up virtual visit for tomorrow or Thursday with Dr. Caryl Comes.

## 2018-11-18 NOTE — Telephone Encounter (Signed)
Patient did not call back to set up Telehealth visit with Dr. Caryl Comes.

## 2018-11-22 IMAGING — DX DG CHEST 1V PORT
1 series · 1 of 1 positions shown · non-contrast
Comparison: 01/02/2017, 12/05/2010

CLINICAL DATA: Syncope and bradycardia

EXAM:
PORTABLE CHEST 1 VIEW

[chest ap]
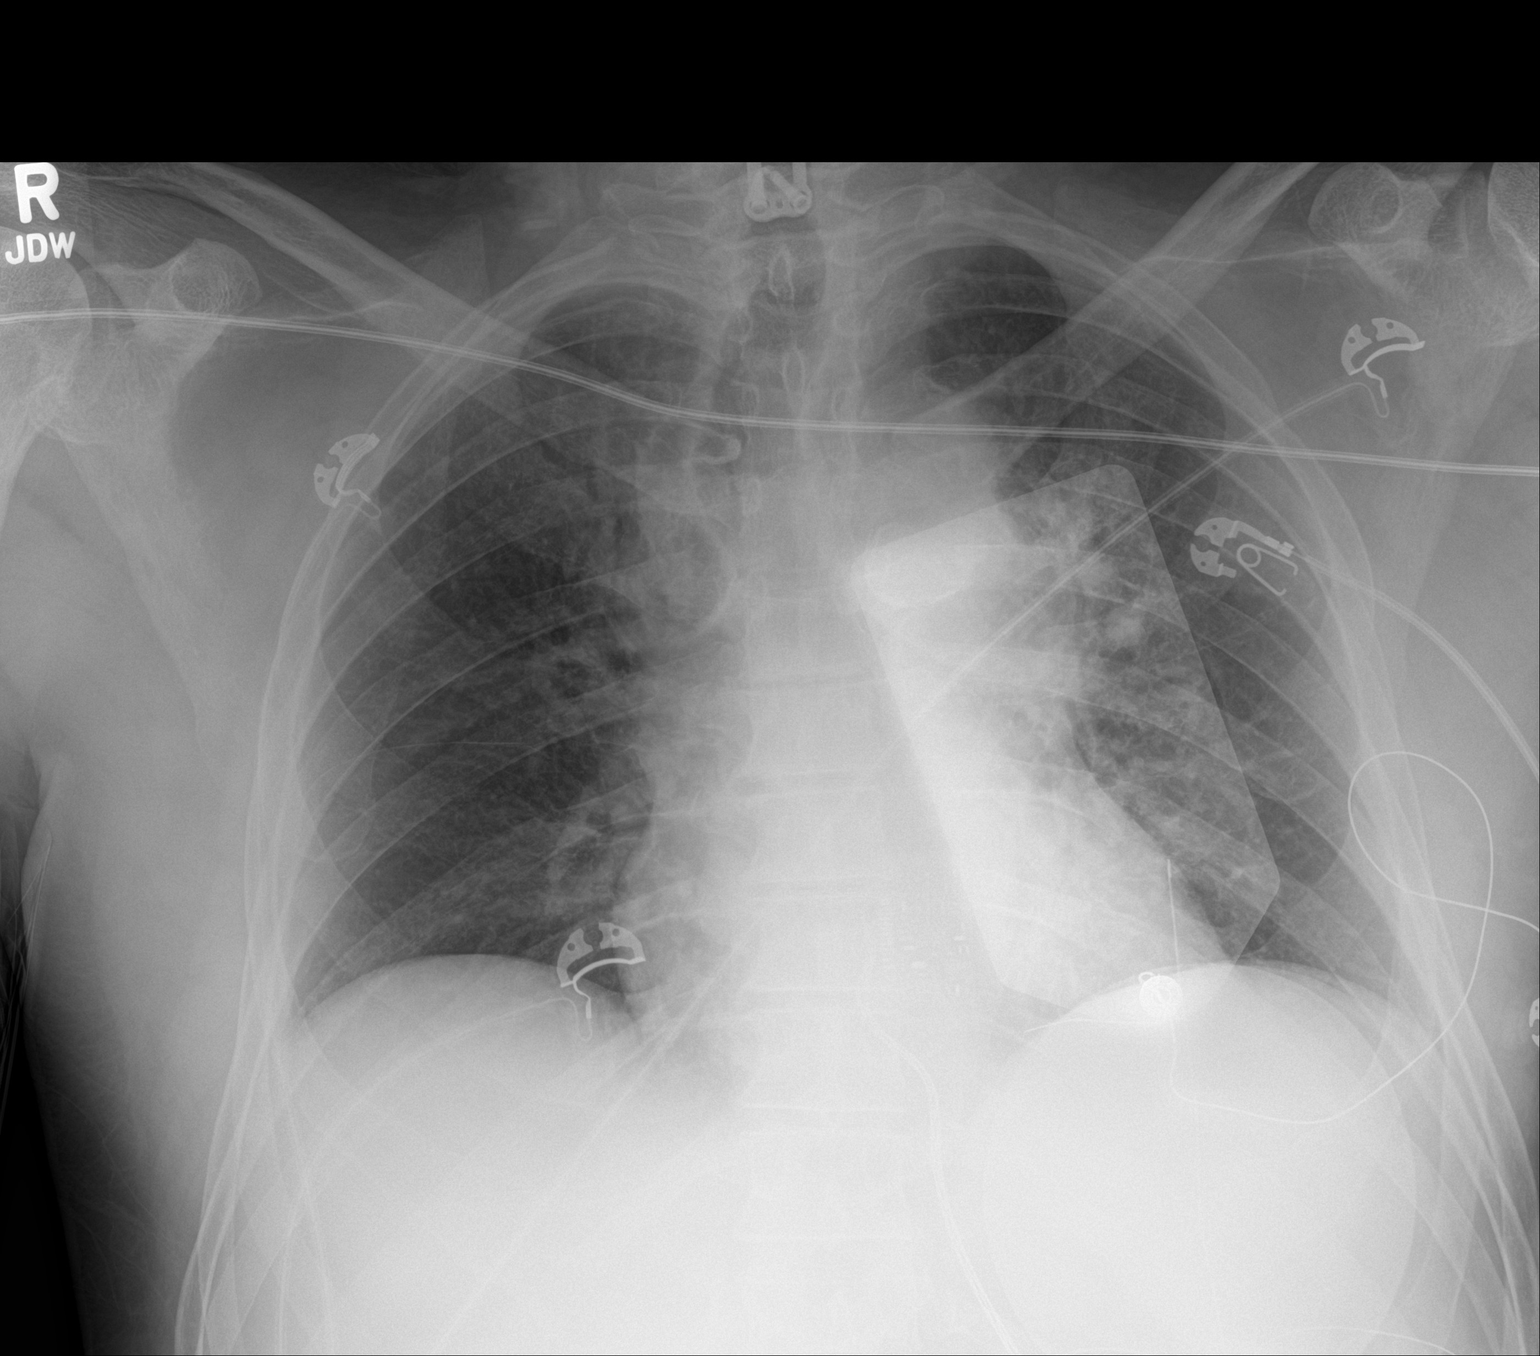

[1 of 1 positions shown; findings below may reference images not displayed]

FINDINGS: Surgical changes in the cervical spine. Mildly low lung volumes. No
consolidation or effusion. Heart size upper normal. No pneumothorax.
IMPRESSION: Low lung volumes with borderline cardiomegaly. Negative for edema or
infiltrate.

## 2018-11-22 IMAGING — CT CT HEAD W/O CM
5 of 7 series · 17 of 47 positions shown, 18 images · non-contrast
Comparison: Cervical spine MRI 08/19/2008

CLINICAL DATA: Head trauma. Syncopal episode. History of renal and
colon cancer. Prior cervical spine surgery.

EXAM:
CT HEAD WITHOUT CONTRAST
CT CERVICAL SPINE WITHOUT CONTRAST
TECHNIQUE: Multidetector CT imaging of the head and cervical spine was
performed following the standard protocol without intravenous
contrast. Multiplanar CT image reconstructions of the cervical spine
were also generated.

[Series 4: head 5.0 h30s · axial · 0.42mm/px · z∈[-33,+22]mm · 2 of 34 slices shown, 3 images]
[im 12/34  brain]
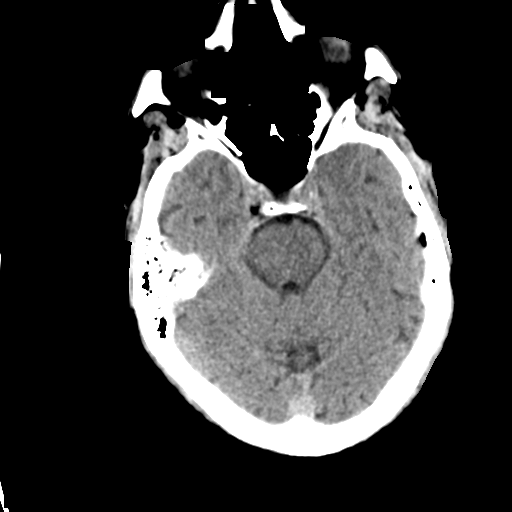
[im 12/34  bone]
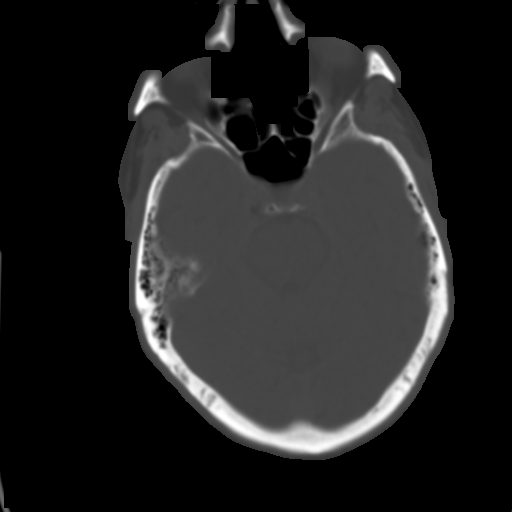
[im 23/34  brain]
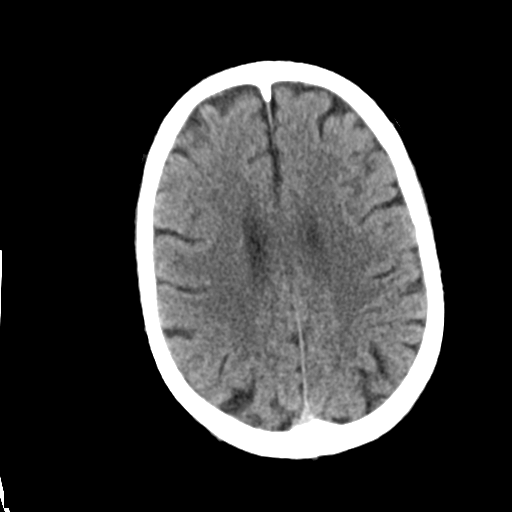

[Series 7: head 3.0 mpr sag · sagittal · 0.36mm/px · 2 of 63 slices shown]
[im 21/63  brain]
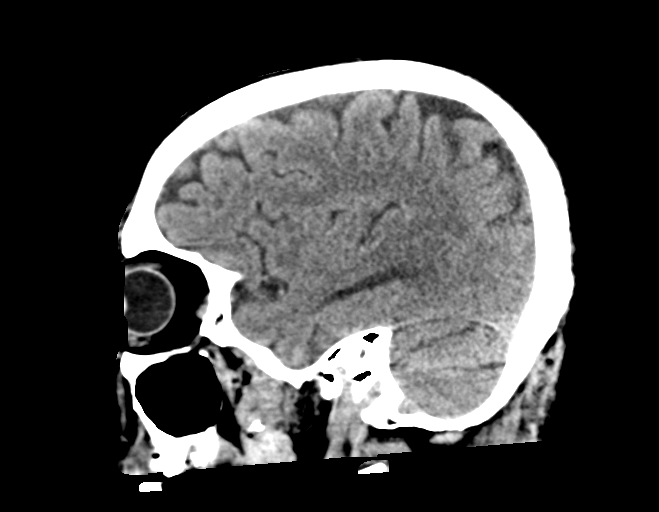
[im 42/63  brain]
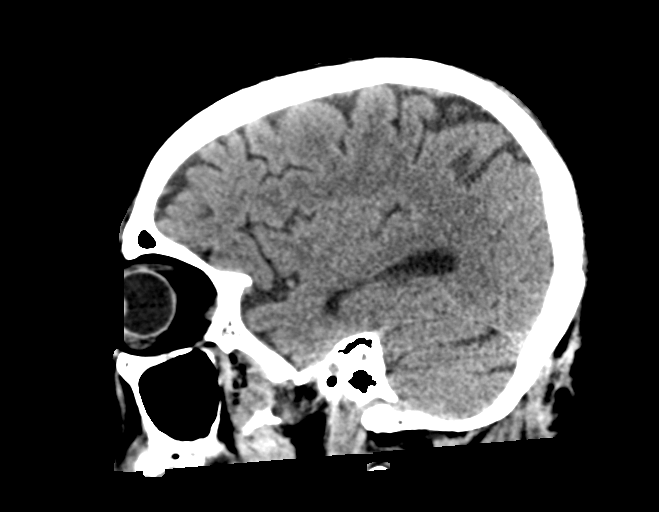

[Series 9: c_spine 2.0 st · axial · 0.44mm/px · z∈[-226,-66]mm · 8 of 97 slices shown]
[im 9/97  brain]
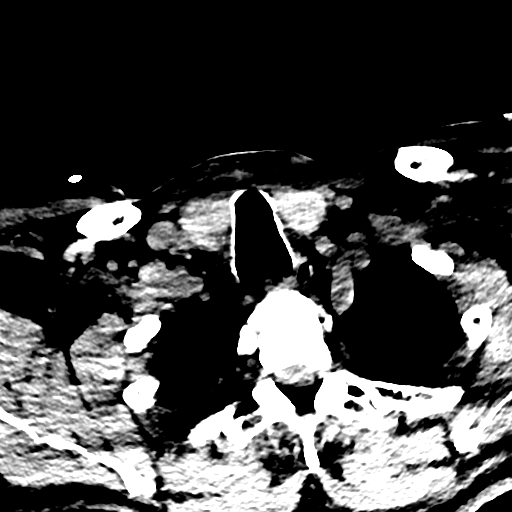
[im 25/97  brain]
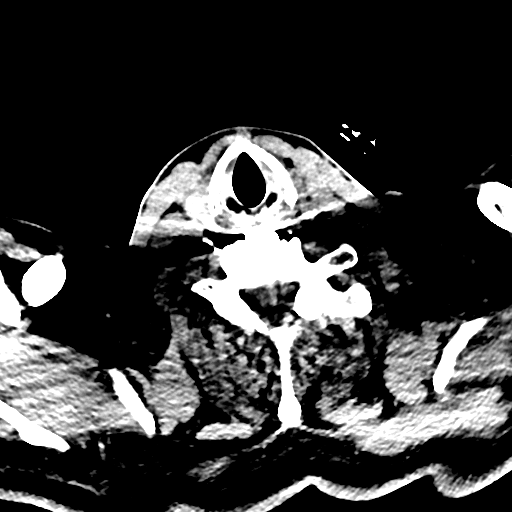
[im 33/97  brain]
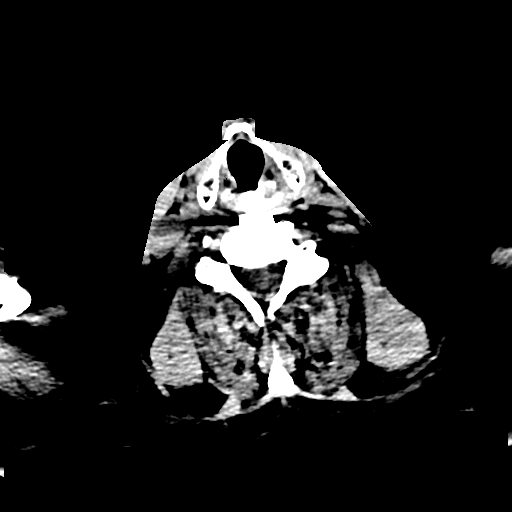
[im 41/97  brain]
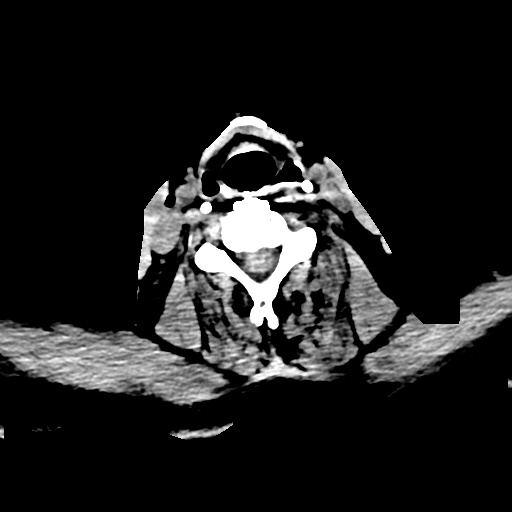
[im 57/97  brain]
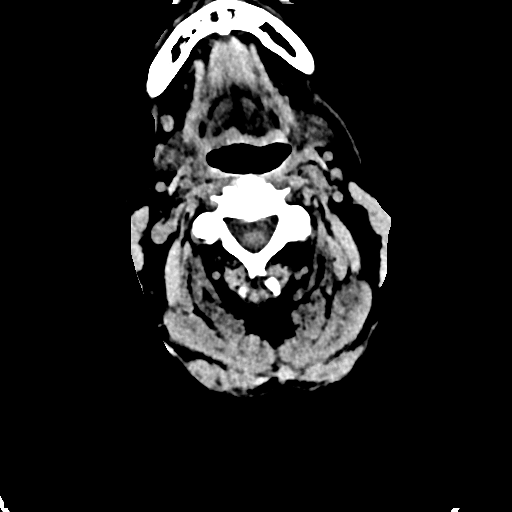
[im 65/97  brain]
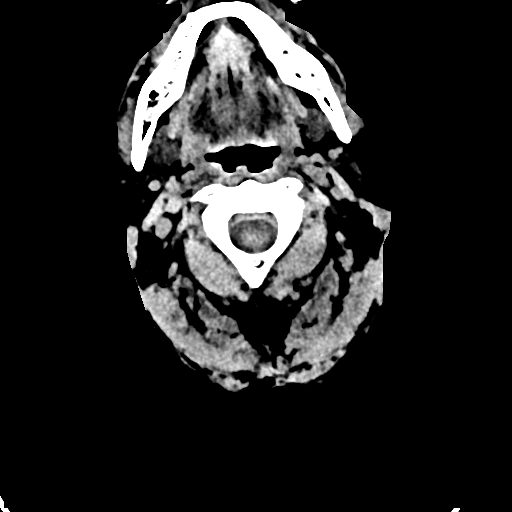
[im 73/97  brain]
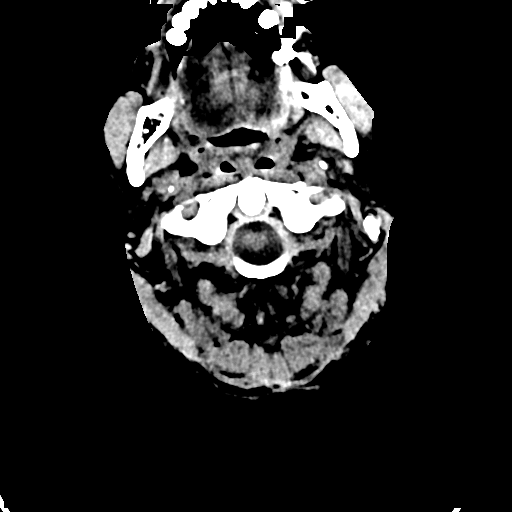
[im 89/97  brain]
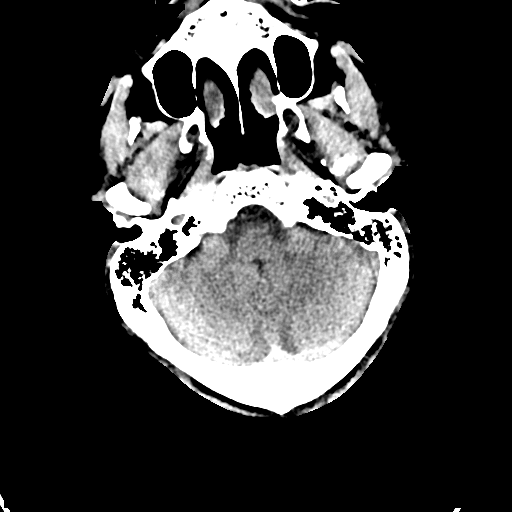

[Series 12: coronal bone · coronal · 0.39mm/px · 3 of 73 slices shown]
[im 21/73  brain]
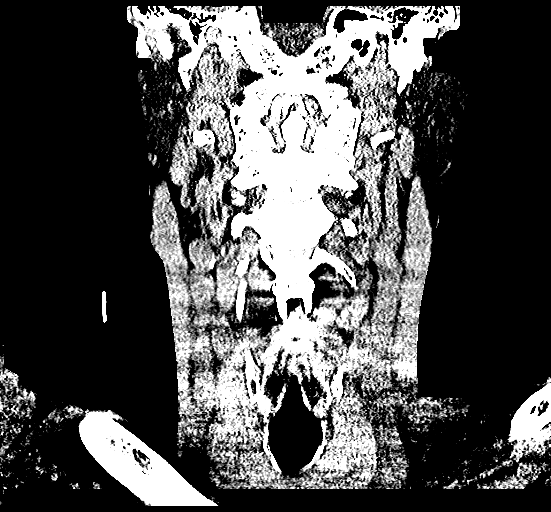
[im 31/73  brain]
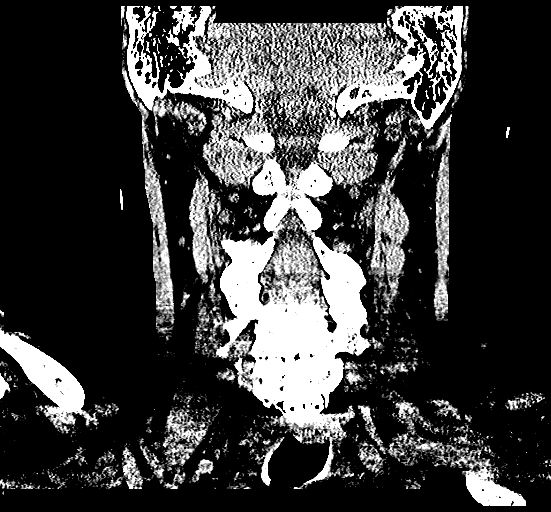
[im 42/73  brain]
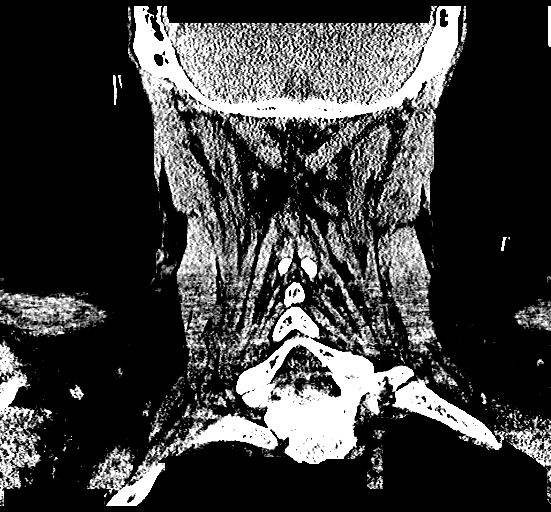

[Series 15: c_spine 2.0 orthog · axial · 0.29mm/px · z∈[-244,-212]mm · 2 of 96 slices shown]
[im 8/96  brain]
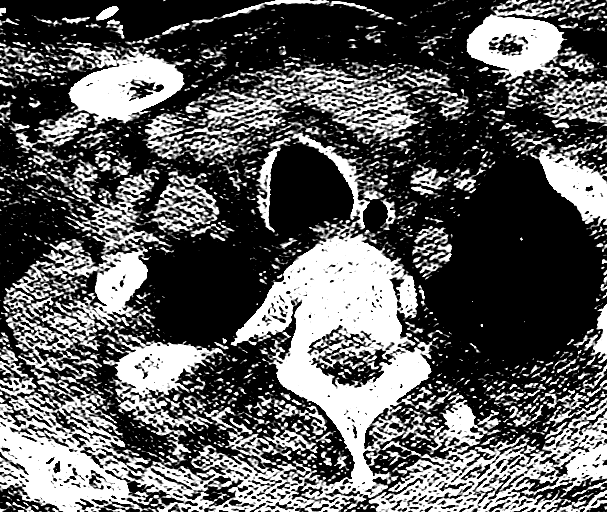
[im 24/96  brain]
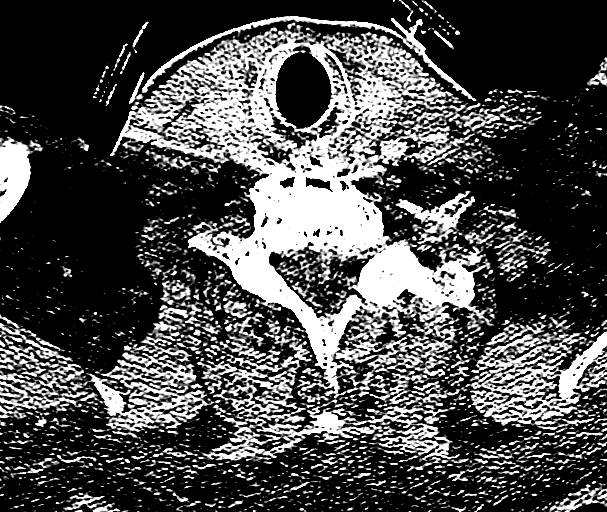

[17 of 47 positions shown; findings below may reference images not displayed]

FINDINGS: CT HEAD FINDINGS

Brain: No mass lesion, intraparenchymal hemorrhage or extra-axial
collection. No evidence of acute cortical infarct. Brain parenchyma
and CSF-containing spaces are normal for age.

Vascular: No hyperdense vessel or unexpected calcification.

Skull: Normal visualized skull base, calvarium and extracranial soft
tissues.

Sinuses/Orbits: No sinus fluid levels or advanced mucosal
thickening. No mastoid effusion. Normal orbits.

CT CERVICAL SPINE FINDINGS

Alignment: There is ACDF hardware at C4-C7. There is osseous fusion
across the C4-C5 and C5-C6 disc spaces. There is no osseous fusion
at the C6-7 disc space. No static subluxation.

Skull base and vertebrae: No acute fracture.

Soft tissues and spinal canal: No prevertebral fluid or swelling. No
visible canal hematoma.

Disc levels: There is severe right C3-4 neural foraminal stenosis
secondary to right facet hypertrophy and uncovertebral spurring. No
bony spinal canal stenosis or other advanced foraminal stenosis.

Upper chest: No pneumothorax, pulmonary nodule or pleural effusion.

Other: Normal visualized paraspinal cervical soft tissues.
IMPRESSION: 1. Normal brain.
2. No acute fracture or static subluxation of the cervical spine.
3. C4-C7 ACDF with osseous fusion from C4-C6. No focal hardware
abnormality. Severe right C3-4 neural foraminal stenosis.

## 2018-11-24 IMAGING — CR DG CHEST 2V
2 series · 2 of 2 positions shown · non-contrast
Comparison: 06/22/2017 and earlier.

CLINICAL DATA: 76-year-old male status post cardiac pacemaker.

EXAM:
CHEST  2 VIEW

[chest pa]
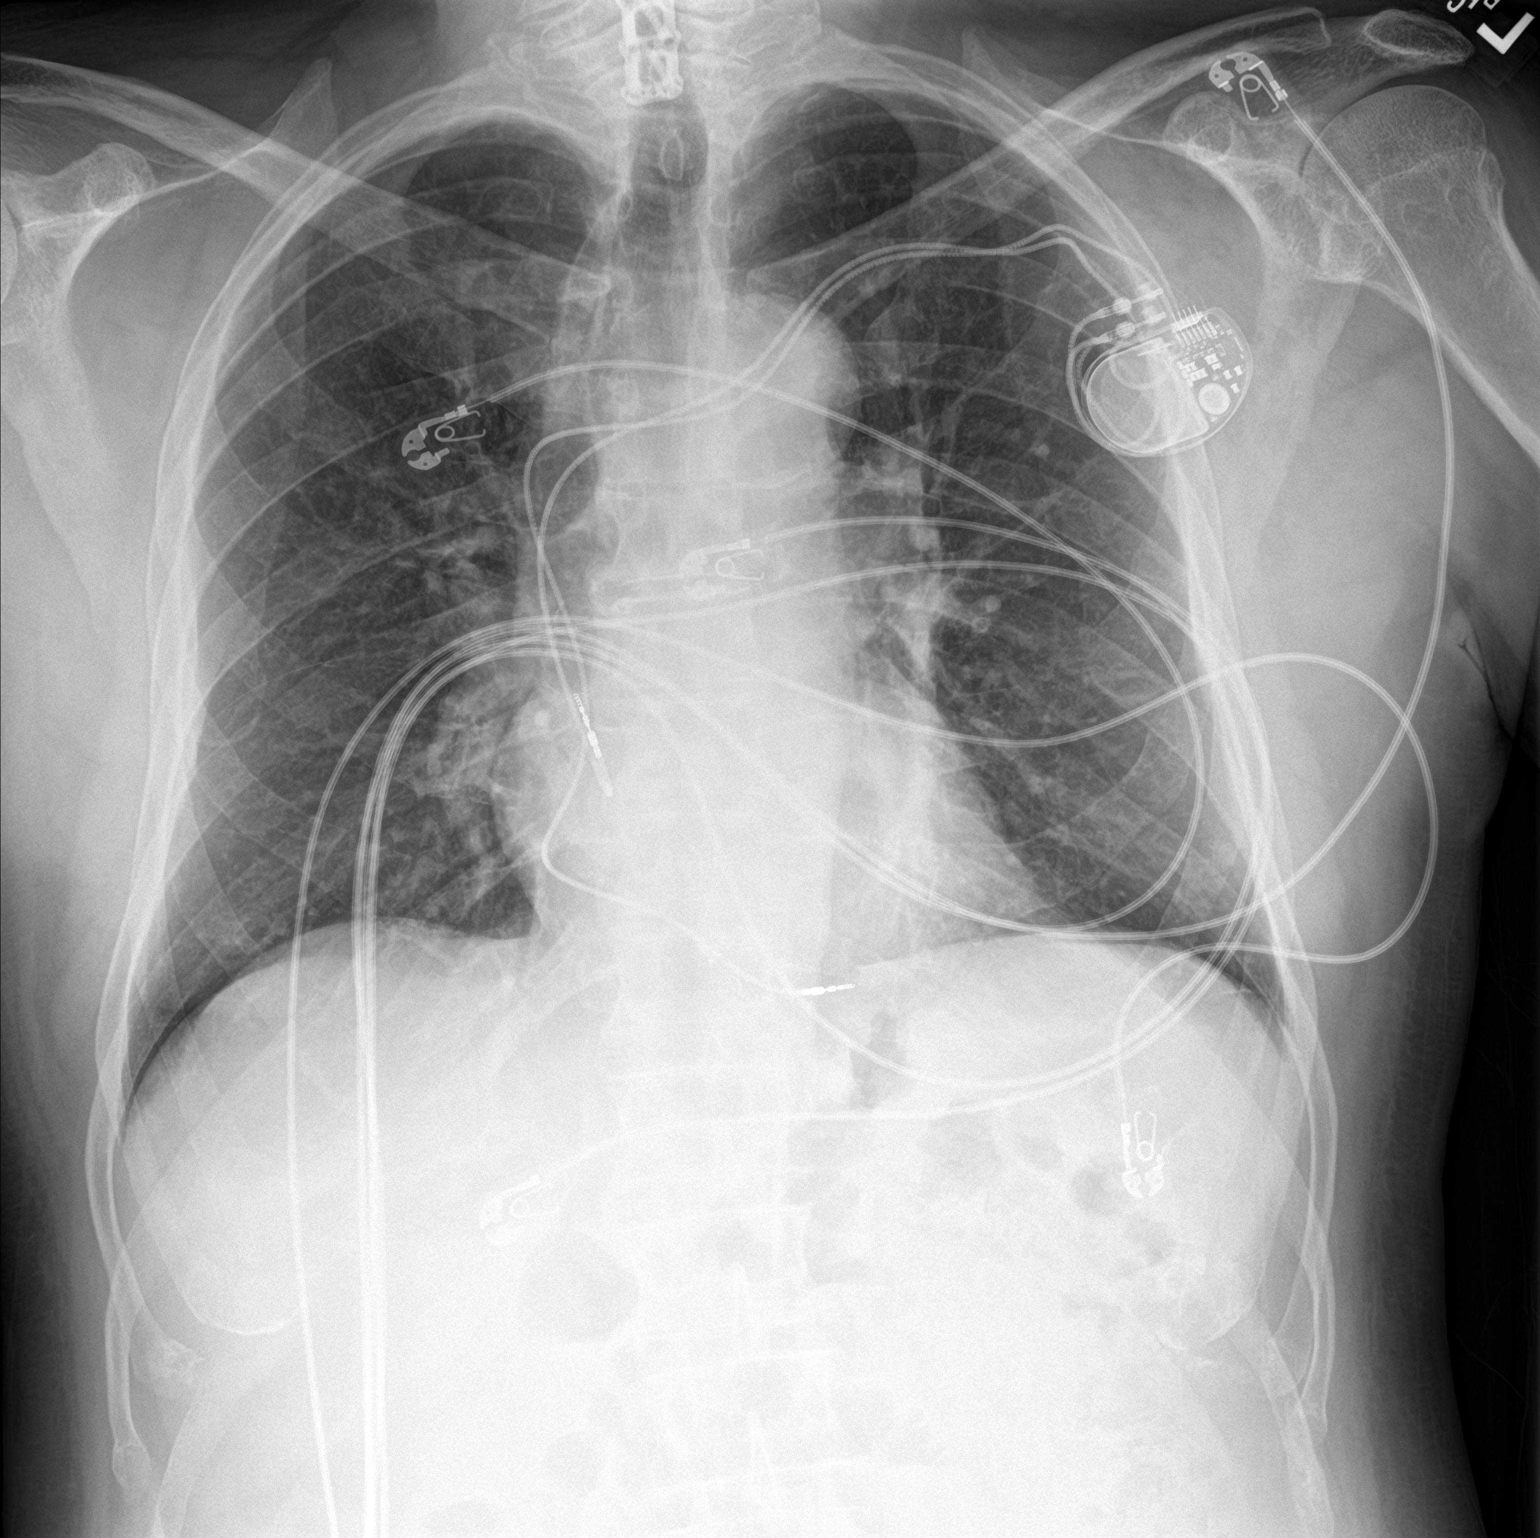

[chest lat]
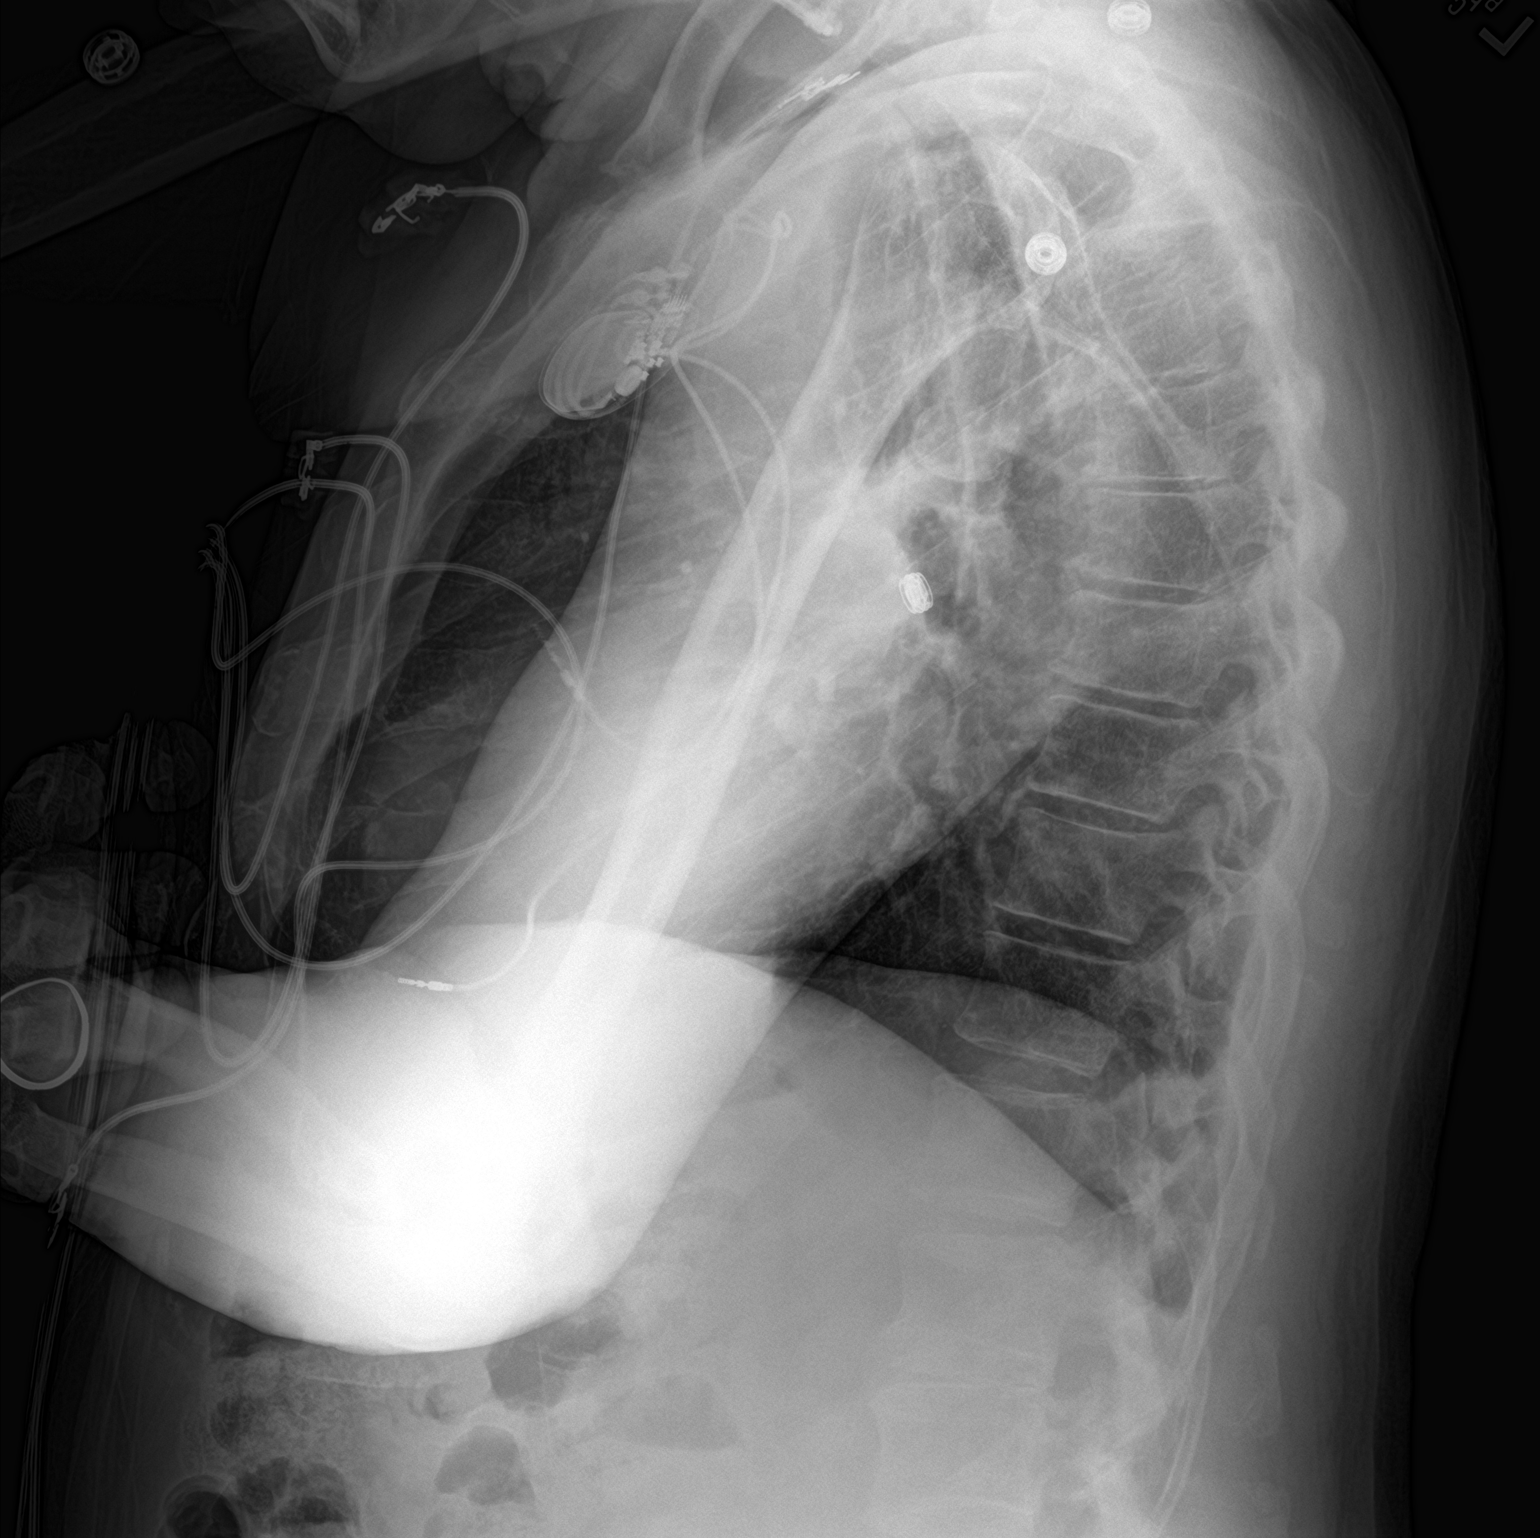

[2 of 2 positions shown; findings below may reference images not displayed]

FINDINGS: PA and lateral views. Left chest dual lead cardiac pacemaker in
place. Leads course to the right atrium and RV apex region. Improved
lung volumes. No pneumothorax. No pulmonary edema. Possible trace
pleural effusions. Mild streaky left infrahilar opacity most
resembles atelectasis. Stable cardiac size and mediastinal contours.
Incidental azygos fissure (normal variant).

Partially visible cervical ACDF. Negative visible bowel gas pattern.
No acute osseous abnormality identified.
IMPRESSION: 1. Left chest dual lead cardiac pacemaker placed with no adverse
features.
2. Possible small bilateral pleural effusions, left lower lung
atelectasis, but mildly improved lung volumes in ventilation
overall.

## 2018-12-24 ENCOUNTER — Ambulatory Visit (INDEPENDENT_AMBULATORY_CARE_PROVIDER_SITE_OTHER): Payer: Medicare Other | Admitting: *Deleted

## 2018-12-24 DIAGNOSIS — I442 Atrioventricular block, complete: Secondary | ICD-10-CM

## 2018-12-24 LAB — CUP PACEART REMOTE DEVICE CHECK
Battery Remaining Longevity: 114 mo
Battery Remaining Percentage: 95 %
Battery Voltage: 3 V
Brady Statistic RA Percent Paced: 82 %
Brady Statistic RV Percent Paced: 7.2 %
Date Time Interrogation Session: 20200528193001
Implantable Lead Implant Date: 20181126
Implantable Lead Implant Date: 20181126
Implantable Lead Location: 753859
Implantable Lead Location: 753860
Implantable Lead Model: 5076
Implantable Lead Model: 5076
Implantable Pulse Generator Implant Date: 20181126
Lead Channel Impedance Value: 490 Ohm
Lead Channel Impedance Value: 530 Ohm
Lead Channel Sensing Intrinsic Amplitude: 12 mV
Lead Channel Sensing Intrinsic Amplitude: 4.4 mV
Lead Channel Setting Pacing Amplitude: 2 V
Lead Channel Setting Pacing Amplitude: 2.5 V
Lead Channel Setting Pacing Pulse Width: 0.5 ms
Lead Channel Setting Sensing Sensitivity: 2 mV
Pulse Gen Model: 2272
Pulse Gen Serial Number: 8971004

## 2018-12-30 ENCOUNTER — Encounter: Payer: Self-pay | Admitting: Cardiology

## 2018-12-30 NOTE — Progress Notes (Signed)
Remote pacemaker transmission.   

## 2019-01-05 ENCOUNTER — Other Ambulatory Visit: Payer: Self-pay

## 2019-01-05 ENCOUNTER — Encounter (HOSPITAL_COMMUNITY): Payer: Self-pay

## 2019-01-05 ENCOUNTER — Inpatient Hospital Stay: Payer: Medicare Other | Attending: Oncology

## 2019-01-05 ENCOUNTER — Ambulatory Visit (HOSPITAL_COMMUNITY)
Admission: RE | Admit: 2019-01-05 | Discharge: 2019-01-05 | Disposition: A | Discharge status: Home / Self Care | Payer: Medicare Other | Source: Ambulatory Visit | Attending: Oncology | Admitting: Oncology

## 2019-01-05 DIAGNOSIS — C189 Malignant neoplasm of colon, unspecified: Secondary | ICD-10-CM | POA: Diagnosis present

## 2019-01-05 DIAGNOSIS — Z85528 Personal history of other malignant neoplasm of kidney: Secondary | ICD-10-CM | POA: Diagnosis not present

## 2019-01-05 DIAGNOSIS — G629 Polyneuropathy, unspecified: Secondary | ICD-10-CM | POA: Diagnosis not present

## 2019-01-05 DIAGNOSIS — Z85038 Personal history of other malignant neoplasm of large intestine: Secondary | ICD-10-CM | POA: Diagnosis not present

## 2019-01-05 LAB — CBC WITH DIFFERENTIAL (CANCER CENTER ONLY)
Abs Immature Granulocytes: 0.02 10*3/uL (ref 0.00–0.07)
Basophils Absolute: 0 10*3/uL (ref 0.0–0.1)
Basophils Relative: 1 %
Eosinophils Absolute: 0.1 10*3/uL (ref 0.0–0.5)
Eosinophils Relative: 1 %
HCT: 42.1 % (ref 39.0–52.0)
Hemoglobin: 14 g/dL (ref 13.0–17.0)
Immature Granulocytes: 0 %
Lymphocytes Relative: 41 %
Lymphs Abs: 1.9 10*3/uL (ref 0.7–4.0)
MCH: 32.4 pg (ref 26.0–34.0)
MCHC: 33.3 g/dL (ref 30.0–36.0)
MCV: 97.5 fL (ref 80.0–100.0)
Monocytes Absolute: 0.5 10*3/uL (ref 0.1–1.0)
Monocytes Relative: 11 %
Neutro Abs: 2.2 10*3/uL (ref 1.7–7.7)
Neutrophils Relative %: 46 %
Platelet Count: 174 10*3/uL (ref 150–400)
RBC: 4.32 MIL/uL (ref 4.22–5.81)
RDW: 12.7 % (ref 11.5–15.5)
WBC Count: 4.7 10*3/uL (ref 4.0–10.5)
nRBC: 0 % (ref 0.0–0.2)

## 2019-01-05 LAB — CMP (CANCER CENTER ONLY)
ALT: 14 U/L (ref 0–44)
AST: 16 U/L (ref 15–41)
Albumin: 4.3 g/dL (ref 3.5–5.0)
Alkaline Phosphatase: 66 U/L (ref 38–126)
Anion gap: 11 (ref 5–15)
BUN: 21 mg/dL (ref 8–23)
CO2: 28 mmol/L (ref 22–32)
Calcium: 9.3 mg/dL (ref 8.9–10.3)
Chloride: 102 mmol/L (ref 98–111)
Creatinine: 1.16 mg/dL (ref 0.61–1.24)
GFR, Est AFR Am: >60 mL/min (ref >60)
GFR, Estimated: >60 mL/min (ref >60)
Glucose, Bld: 94 mg/dL (ref 70–99)
Potassium: 4.7 mmol/L (ref 3.5–5.1)
Sodium: 141 mmol/L (ref 135–145)
Total Bilirubin: 0.8 mg/dL (ref 0.3–1.2)
Total Protein: 7.5 g/dL (ref 6.5–8.1)

## 2019-01-05 MED ORDER — SODIUM CHLORIDE (PF) 0.9 % IJ SOLN
INTRAMUSCULAR | Status: AC
  Filled 2019-01-05: qty 50

## 2019-01-05 MED ORDER — IOHEXOL 300 MG/ML  SOLN
100.0000 mL | Freq: Once | INTRAMUSCULAR | Status: AC | PRN
  Administered 2019-01-05: 100 mL via INTRAVENOUS

## 2019-01-12 ENCOUNTER — Inpatient Hospital Stay (HOSPITAL_BASED_OUTPATIENT_CLINIC_OR_DEPARTMENT_OTHER): Payer: Medicare Other | Admitting: Oncology

## 2019-01-12 ENCOUNTER — Other Ambulatory Visit: Payer: Self-pay

## 2019-01-12 ENCOUNTER — Telehealth: Payer: Self-pay | Admitting: Oncology

## 2019-01-12 VITALS — BP 129/66 | HR 81 | Temp 98.9°F | Resp 16 | Wt 195.1 lb

## 2019-01-12 DIAGNOSIS — Z85528 Personal history of other malignant neoplasm of kidney: Secondary | ICD-10-CM

## 2019-01-12 DIAGNOSIS — C189 Malignant neoplasm of colon, unspecified: Secondary | ICD-10-CM

## 2019-01-12 DIAGNOSIS — Z85038 Personal history of other malignant neoplasm of large intestine: Secondary | ICD-10-CM | POA: Diagnosis not present

## 2019-01-12 DIAGNOSIS — G629 Polyneuropathy, unspecified: Secondary | ICD-10-CM | POA: Diagnosis not present

## 2019-01-12 NOTE — Progress Notes (Signed)
Hematology and Oncology Follow Up Visit  PHU RECORD [id number] [birth year] 78 y.o. 01/12/2019 10:03 AM Delfina Redwood, [name], MDPolite, [name], MD   Principle Diagnosis: 78 year old with colon cancer diagnosed in September 2015.  He was found to have stage III (T1N1) at that time.   Prior Therapy:  He is status post right colectomy done laparoscopically on 04/01/2014. He had a T1 N1 disease. 1 out of 12 lymph nodes involved.   He also status post partial nephrectomy on 04/01/2014 for a papillary tumor.  FOLFOX adjuvant chemotherapy cycle 1 to given on 05/31/2014. Status post 11 cycles completed on 11/28/2014.  Current therapy: Active surveillance.  Interim History:  Mr. Skowron returns today for a repeat evaluation.  Since last visit, he reports no major changes in his health.  He continues to feel reasonably well without any GI complaints.  He denies any nausea, fatigue or changes in his bowel habits.  He denies any flank pain or hematuria.  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  He denied any alteration mental status, neuropathy, confusion or dizziness.  Denies any headaches or lethargy.  Denies any night sweats, weight loss or changes in appetite.  Denied orthopnea, dyspnea on exertion or chest discomfort.  Denies shortness of breath, difficulty breathing hemoptysis or cough.  Denies any abdominal distention, nausea, early satiety or dyspepsia.  Denies any hematuria, frequency, dysuria or nocturia.  Denies any skin irritation, dryness or rash.  Denies any ecchymosis or petechiae.  Denies any lymphadenopathy or clotting.  Denies any heat or cold intolerance.  Denies any anxiety or depression.  Remaining review of system is negative.     Medications: I have reviewed the patient's current medications.  Current Outpatient Medications  Medication Sig Dispense Refill  . aspirin 81 MG tablet Take 81 mg by mouth daily.      .  irbesartan-hydrochlorothiazide (AVALIDE) 150-12.5 MG per tablet Take 1 tablet by mouth every morning.     . Multiple Vitamin (MULTIVITAMIN) capsule Take 1 capsule by mouth daily.      . naproxen sodium (ANAPROX) 220 MG tablet Take 220 mg by mouth 2 (two) times daily with a meal.    . vitamin E 400 UNIT capsule Take 400 Units by mouth daily.      No current facility-administered medications for this visit.      Allergies:  Allergies  Allergen Reactions  . Codeine Other (See Comments)    constipation    Past Medical History, Surgical history, Social history, and Family History were reviewed and updated.   Physical Exam: Blood pressure 129/66, pulse 81, temperature 98.9 F (37.2 C), temperature source Oral, resp. rate 16, weight 195 lb 2 oz (88.5 kg), SpO2 98 %.   ECOG: 0   General appearance: Comfortable appearing without any discomfort Head: Normocephalic without any trauma Oropharynx: Mucous membranes are moist and pink without any thrush or ulcers. Eyes: Pupils are equal and round reactive to light. Lymph nodes: No cervical, supraclavicular, inguinal or axillary lymphadenopathy.   Heart:regular rate and rhythm.  S1 and S2 without leg edema. Lung: Clear without any rhonchi or wheezes.  No dullness to percussion. Abdomin: Soft, nontender, nondistended with good bowel sounds.  No hepatosplenomegaly. Musculoskeletal: No joint deformity or effusion.  Full range of motion noted. Neurological: No deficits noted on motor, sensory and deep tendon reflex exam. Skin: No petechial rash or dryness.  Appeared moist.    Lab Results: Lab Results  Component Value Date  WBC 4.7 01/05/2019   HGB 14.0 01/05/2019   HCT 42.1 01/05/2019   MCV 97.5 01/05/2019   PLT 174 01/05/2019     Chemistry      Component Value Date/Time   NA 141 01/05/2019 1000   NA 140 01/02/2017 0954   K 4.7 01/05/2019 1000   K 4.2 01/02/2017 0954   CL 102 01/05/2019 1000   CO2 28 01/05/2019 1000   CO2 28  01/02/2017 0954   BUN 21 01/05/2019 1000   BUN 26.4 (H) 01/02/2017 0954   CREATININE 1.16 01/05/2019 1000   CREATININE 1.1 01/02/2017 0954      Component Value Date/Time   CALCIUM 9.3 01/05/2019 1000   CALCIUM 9.7 01/02/2017 0954   ALKPHOS 66 01/05/2019 1000   ALKPHOS 65 01/02/2017 0954   AST 16 01/05/2019 1000   AST 19 01/02/2017 0954   ALT 14 01/05/2019 1000   ALT 17 01/02/2017 0954   BILITOT 0.8 01/05/2019 1000   BILITOT 0.75 01/02/2017 0954      EXAM: CT CHEST, ABDOMEN, AND PELVIS WITH CONTRAST  TECHNIQUE: Multidetector CT imaging of the chest, abdomen and pelvis was performed following the standard protocol during bolus administration of intravenous contrast.  CONTRAST:  145mL OMNIPAQUE IOHEXOL 300 MG/ML  SOLN  COMPARISON:  01/06/2018  FINDINGS: CT CHEST FINDINGS  Cardiovascular: No acute findings. Aortic and coronary artery atherosclerosis. Stable ductus diverticulum with mural calcification again noted.  Mediastinum/Lymph Nodes: No masses or pathologically enlarged lymph nodes identified.  Lungs/Pleura: Azygos fissure incidentally no pulmonary infiltrate or mass identified. No effusion present.  Musculoskeletal:  No suspicious bone lesions identified.  CT ABDOMEN AND PELVIS FINDINGS  Hepatobiliary: No masses identified. Gallbladder is unremarkable.  Pancreas:  No mass or inflammatory changes.  Spleen:  Within normal limits in size and appearance.  Adrenals/Urinary tract: No masses or hydronephrosis. Stable small bilateral renal cysts. Unremarkable unopacified urinary bladder.  Stomach/Bowel: Stable postop changes from partial right colectomy. No evidence of obstruction, inflammatory process, or abnormal fluid collections.  Vascular/Lymphatic: No pathologically enlarged lymph nodes identified. No abdominal aortic aneurysm.  Reproductive:  Stable mildly enlarged prostate.  Other:  None.  Musculoskeletal: No suspicious bone  lesions identified. Small sclerotic foci in left ischial tuberosity are unchanged and likely represent bone islands.  IMPRESSION: Stable exam. No evidence of recurrent or metastatic carcinoma, or other acute findings.   Impression and Plan:   78 year old man with:  1.  Colon cancer diagnosed in 2015.  He was found to have stage III disease with very limited lymph node involvement.  He completed surgical resection followed by adjuvant chemotherapy.  He is close to 5 years out from his diagnosis without any evidence of relapsed disease.  CT scan obtained on June 9 of 2020 was personally reviewed and showed no evidence of relapsed disease.  At this time his risk of relapse remains very low and the natural course of this disease moving forward was discussed.  I have recommended no further surveillance would be needed.  He will have a repeat physical examination in 1 year with no further imaging studies unless needed.  2. Cardiac arrhythmia: He continues to follow with cardiology regarding this issue.  3. Renal cell carcinoma: No evidence of relapsed disease noted on the CT scan.  No further imaging will be needed at this time.  4. Colonoscopy surveillance: He is up-to-date with his next colonoscopy scheduled for this year.   5. Peripheral neuropathy: Remains stable at this time without any decline.  6. Follow-up: In 1 year for repeat evaluation.  15  minutes was spent with the patient face-to-face today.  More than 50% of time was spent on reviewing his disease status, reviewing imaging studies answer questions regarding future plan of care.   Zola Button, MD 6/16/202010:03 AM

## 2019-01-12 NOTE — Telephone Encounter (Signed)
Scheduled per los. Mailed printout  °

## 2019-02-18 ENCOUNTER — Encounter: Payer: Self-pay | Admitting: Gastroenterology

## 2019-03-12 ENCOUNTER — Other Ambulatory Visit: Payer: Self-pay

## 2019-03-12 ENCOUNTER — Ambulatory Visit (AMBULATORY_SURGERY_CENTER): Payer: Self-pay

## 2019-03-12 VITALS — Ht 72.0 in | Wt 195.4 lb

## 2019-03-12 DIAGNOSIS — Z85038 Personal history of other malignant neoplasm of large intestine: Secondary | ICD-10-CM

## 2019-03-12 MED ORDER — NA SULFATE-K SULFATE-MG SULF 17.5-3.13-1.6 GM/177ML PO SOLN: 1.0000 | Freq: Once | ORAL | 0 refills | Status: AC

## 2019-03-12 NOTE — Progress Notes (Signed)
Denies allergies to eggs or soy products. Denies complication of anesthesia or sedation. Denies use of weight loss medication. Denies use of O2.   Emmi instructions given for colonoscopy.  

## 2019-03-22 ENCOUNTER — Encounter: Payer: Self-pay | Admitting: Gastroenterology

## 2019-03-25 ENCOUNTER — Telehealth: Payer: Self-pay

## 2019-03-25 ENCOUNTER — Ambulatory Visit (INDEPENDENT_AMBULATORY_CARE_PROVIDER_SITE_OTHER): Payer: Medicare Other | Admitting: *Deleted

## 2019-03-25 DIAGNOSIS — R001 Bradycardia, unspecified: Secondary | ICD-10-CM | POA: Diagnosis not present

## 2019-03-25 LAB — CUP PACEART REMOTE DEVICE CHECK
Battery Remaining Longevity: 114 mo
Battery Remaining Percentage: 95.5 %
Battery Voltage: 2.99 V
Brady Statistic AP VP Percent: 8.2 %
Brady Statistic AP VS Percent: 74 %
Brady Statistic AS VP Percent: 1.5 %
Brady Statistic AS VS Percent: 17 %
Brady Statistic RA Percent Paced: 81 %
Brady Statistic RV Percent Paced: 10 %
Date Time Interrogation Session: 20200827055014
Implantable Lead Implant Date: 20181126
Implantable Lead Implant Date: 20181126
Implantable Lead Location: 753859
Implantable Lead Location: 753860
Implantable Lead Model: 5076
Implantable Lead Model: 5076
Implantable Pulse Generator Implant Date: 20181126
Lead Channel Impedance Value: 490 Ohm
Lead Channel Impedance Value: 530 Ohm
Lead Channel Sensing Intrinsic Amplitude: 12 mV
Lead Channel Sensing Intrinsic Amplitude: 4.2 mV
Lead Channel Setting Pacing Amplitude: 2 V
Lead Channel Setting Pacing Amplitude: 2.5 V
Lead Channel Setting Pacing Pulse Width: 0.5 ms
Lead Channel Setting Sensing Sensitivity: 2 mV
Pulse Gen Model: 2272
Pulse Gen Serial Number: 8971004

## 2019-03-25 NOTE — Telephone Encounter (Signed)
Covid-19 screening questions   Do you now or have you had a fever in the last 14 days?  Do you have any respiratory symptoms of shortness of breath or cough now or in the last 14 days?  Do you have any family members or close contacts with diagnosed or suspected Covid-19 in the past 14 days?  Have you been tested for Covid-19 and found to be positive?       

## 2019-03-26 ENCOUNTER — Encounter: Payer: Self-pay | Admitting: Gastroenterology

## 2019-03-26 ENCOUNTER — Ambulatory Visit (AMBULATORY_SURGERY_CENTER): Payer: Medicare Other | Admitting: Gastroenterology

## 2019-03-26 ENCOUNTER — Other Ambulatory Visit: Payer: Self-pay

## 2019-03-26 VITALS — BP 131/74 | HR 60 | Temp 98.0°F | Resp 15 | Ht 72.0 in | Wt 195.0 lb

## 2019-03-26 DIAGNOSIS — Z85038 Personal history of other malignant neoplasm of large intestine: Secondary | ICD-10-CM | POA: Diagnosis not present

## 2019-03-26 MED ORDER — SODIUM CHLORIDE 0.9 % IV SOLN: 500.0000 mL | Freq: Once | INTRAVENOUS | Status: DC

## 2019-03-26 NOTE — Op Note (Signed)
Jorge Ross Patient Name: Jorge Ross Procedure Date: 03/26/2019 11:14 AM MRN: [id number] Endoscopist: Mallie Mussel L. Loletha Carrow , MD Age: 78 Referring MD:  Date of Birth: February 03, 1941 Gender: Male Account #: [id number] Procedure:                Colonoscopy Indications:              High risk colon cancer surveillance: Personal                            history of colon cancer (cecal cancer, s/p right                            hemicolectomy 03/2014 - then 57mm adenomatous polyp                            02/2016) Medicines:                Monitored Anesthesia Care Procedure:                Pre-Anesthesia Assessment:                           - Prior to the procedure, a History and Physical                            was performed, and patient medications and                            allergies were reviewed. The patient's tolerance of                            previous anesthesia was also reviewed. The risks                            and benefits of the procedure and the sedation                            options and risks were discussed with the patient.                            All questions were answered, and informed consent                            was obtained. Prior Anticoagulants: The patient has                            taken no previous anticoagulant or antiplatelet                            agents except for aspirin. ASA Grade Assessment:                            III - A patient with severe systemic disease. After  reviewing the risks and benefits, the patient was                            deemed in satisfactory condition to undergo the                            procedure.                           After obtaining informed consent, the colonoscope                            was passed under direct vision. Throughout the                            procedure, the patient's blood pressure, pulse, and                            oxygen  saturations were monitored continuously. The                            Colonoscope was introduced through the anus and                            advanced to the the ileocolonic anastomosis. The                            colonoscopy was performed without difficulty. The                            patient tolerated the procedure well. The quality                            of the bowel preparation was good. Neo-terminal                            ileum were photographed. Scope In: 11:27:22 AM Scope Out: 11:39:29 AM Scope Withdrawal Time: 0 hours 8 minutes 11 seconds  Total Procedure Duration: 0 hours 12 minutes 7 seconds  Findings:                 The perianal and digital rectal examinations were                            normal.                           There was evidence of a prior end-to-side                            ileo-colonic anastomosis in the proximal transverse                            colon. This was patent and was characterized by  healthy appearing mucosa.                           The exam was otherwise without abnormality on                            direct and retroflexion views. Complications:            No immediate complications. Estimated Blood Loss:     Estimated blood loss: none. Impression:               - Patent end-to-side ileo-colonic anastomosis,                            characterized by healthy appearing mucosa.                           - The examination was otherwise normal on direct                            and retroflexion views.                           - No specimens collected. Recommendation:           - Patient has a contact number available for                            emergencies. The signs and symptoms of potential                            delayed complications were discussed with the                            patient. Return to normal activities tomorrow.                            Written discharge  instructions were provided to the                            patient.                           - Resume previous diet.                           - Continue present medications.                           - Repeat colonoscopy in 4 years for surveillance. Henry L. Loletha Carrow, MD 03/26/2019 11:46:52 AM This report has been signed electronically.

## 2019-03-26 NOTE — Progress Notes (Signed)
Pt is comfortable, abdomin is soft but has not passed flatus.  Poisition changed to rt lateral decubitus and encourage him to pass flatus.pass only 1 time a very small amount of flatus.  Pt up to the rest room to see if he can pass more flatus. maw

## 2019-03-26 NOTE — Progress Notes (Signed)
Pt's states no medical or surgical changes since previsit or office visit.  [place]

## 2019-03-26 NOTE — Patient Instructions (Signed)
YOU HAD AN ENDOSCOPIC PROCEDURE TODAY AT [facility]:   Refer to the procedure report that was given to you for any specific questions about what was found during the examination.  If the procedure report does not answer your questions, please call your gastroenterologist to clarify.  If you requested that your care partner not be given the details of your procedure findings, then the procedure report has been included in a sealed envelope for you to review at your convenience later.  YOU SHOULD EXPECT: Some feelings of bloating in the abdomen. Passage of more gas than usual.  Walking can help get rid of the air that was put into your GI tract during the procedure and reduce the bloating. If you had a lower endoscopy (such as a colonoscopy or flexible sigmoidoscopy) you may notice spotting of blood in your stool or on the toilet paper. If you underwent a bowel prep for your procedure, you may not have a normal bowel movement for a few days.  Please Note:  You might notice some irritation and congestion in your nose or some drainage.  This is from the oxygen used during your procedure.  There is no need for concern and it should clear up in a day or so.  SYMPTOMS TO REPORT IMMEDIATELY:   Following lower endoscopy (colonoscopy or flexible sigmoidoscopy):  Excessive amounts of blood in the stool  Significant tenderness or worsening of abdominal pains  Swelling of the abdomen that is new, acute  Fever of 100F or higher   For urgent or emergent issues, a gastroenterologist can be reached at any hour by calling [phone].   DIET:  We do recommend a small meal at first, but then you may proceed to your regular diet.  Drink plenty of fluids but you should avoid alcoholic beverages for 24 hours.  ACTIVITY:  You should plan to take it easy for the rest of today and you should NOT DRIVE or use heavy machinery until tomorrow (because of the sedation medicines used during the test).     FOLLOW UP: Our staff will call the number listed on your records 48-72 hours following your procedure to check on you and address any questions or concerns that you may have regarding the information given to you following your procedure. If we do not reach you, we will leave a message.  We will attempt to reach you two times.  During this call, we will ask if you have developed any symptoms of COVID 19. If you develop any symptoms (ie: fever, flu-like symptoms, shortness of breath, cough etc.) before then, please call [phone].  If you test positive for Covid 19 in the 2 weeks post procedure, please call and report this information to [place].    If any biopsies were taken you will be contacted by phone or by letter within the next 1-3 weeks.  Please call us at [phone] if you have not heard about the biopsies in 3 weeks.    SIGNATURES/CONFIDENTIALITY: You and/or your care partner have signed paperwork which will be entered into your electronic medical record.  These signatures attest to the fact that that the information above on your After Visit Summary has been reviewed and is understood.  Full responsibility of the confidentiality of this discharge information lies with you and/or your care-partner.      You may resume your current medications today. Repeat colonoscopy in 4 years for surveillance. Please call if any questions or concerns.

## 2019-03-26 NOTE — Addendum Note (Signed)
Addended by: Evonnie Pat A on: 03/26/2019 02:49 PM   Modules accepted: Orders

## 2019-03-26 NOTE — Progress Notes (Signed)
To PACU, VSS. Report to [email or web address] 

## 2019-03-30 ENCOUNTER — Telehealth: Payer: Self-pay

## 2019-03-30 NOTE — Telephone Encounter (Signed)
  Follow up Call-  Call back number 03/26/2019  Post procedure Call Back phone  # [phone]  Permission to leave phone message Yes  Some recent data might be hidden     Patient questions:  Do you have a fever, pain , or abdominal swelling? No. Pain Score  0 *  Have you tolerated food without any problems? Yes.    Have you been able to return to your normal activities? Yes.    Do you have any questions about your discharge instructions: Diet   No. Medications  No. Follow up visit  No.  Do you have questions or concerns about your Care? No.  Actions: * If pain score is 4 or above: 1. No action needed, pain <4.Have you developed a fever since your procedure? no  2.   Have you had an respiratory symptoms (SOB or cough) since your procedure? no  3.   Have you tested positive for COVID 19 since your procedure no  4.   Have you had any family members/close contacts diagnosed with the COVID 19 since your procedure?  no   If yes to any of these questions please route to Joylene John, RN and Alphonsa Gin, Therapist, sports.

## 2019-03-30 NOTE — Progress Notes (Signed)
Remote pacemaker transmission.   

## 2019-05-04 ENCOUNTER — Other Ambulatory Visit: Payer: Self-pay

## 2019-05-04 ENCOUNTER — Ambulatory Visit: Payer: Medicare Other | Admitting: Internal Medicine

## 2019-05-04 ENCOUNTER — Encounter: Payer: Self-pay | Admitting: Internal Medicine

## 2019-05-04 VITALS — BP 124/78 | HR 60 | Ht 72.0 in | Wt 199.2 lb

## 2019-05-04 DIAGNOSIS — I442 Atrioventricular block, complete: Secondary | ICD-10-CM

## 2019-05-04 DIAGNOSIS — I495 Sick sinus syndrome: Secondary | ICD-10-CM

## 2019-05-04 DIAGNOSIS — Z950 Presence of cardiac pacemaker: Secondary | ICD-10-CM | POA: Diagnosis not present

## 2019-05-04 DIAGNOSIS — R55 Syncope and collapse: Secondary | ICD-10-CM

## 2019-05-04 NOTE — Progress Notes (Signed)
Patient Care Team: Seward Carol, MD as PCP - General (Internal Medicine) Deboraha Sprang, MD as PCP - Cardiology (Cardiology) Stark Pinkey Mcjunkin, MD as Referring Physician (General Surgery) Wyatt Portela, MD as Consulting Physician (Oncology)   HPI  Jorge Ross is a 78 y.o. 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  There have been no palpitations, lightheadedness or syncope.    Records and Results Reviewed  Past Medical History:  Diagnosis Date  . Arthritis    HIPS  . Chronic kidney disease   . Colon cancer ([place]) 2015  . Hypertension   . Myocardial infarction ([place])   . 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 Unspecified disorder of intestine     Past Surgical History:  Procedure Laterality Date  . CERVICAL SPINE SURGERY     3 titamum screws  . COLECTOMY  03-2014  . COLON SURGERY  04/01/14  . COLONOSCOPY  2007   normal   . CYSTOSCOPY     03-15-16  . HERNIA REPAIR Bilateral    x 3 total   . PACEMAKER IMPLANT N/A 06/23/2017   Procedure: PACEMAKER IMPLANT;  Surgeon: Deboraha Sprang, MD;  Location: [facility];  Service: Cardiovascular;  Laterality: N/A;  . PORT-A-CATH REMOVAL N/A 02/21/2016   Procedure: REMOVAL PORT-A-CATH;  Surgeon: Stark Delila Kuklinski, MD;  Location: [facility];  Service: General;  Laterality: N/A;  . PORTACATH PLACEMENT N/A 05/24/2014   Procedure: INSERTION PORT-A-CATH;  Surgeon: Stark Wilho Sharpley, MD;  Location: [facility];  Service: General;  Laterality: N/A;  . ROBOT ASSISTED LAPAROSCOPIC NEPHRECTOMY Left 04/01/2014   Procedure: ROBOTIC ASSISTED LAPAROSCOPIC LEFT PARTIAL NEPHRECTOMY;  Surgeon: Alexis Frock, MD;  Location: [facility];  Service: Urology;  Laterality: Left;  . ROTATOR  CUFF REPAIR Left   . thumb ligament surgery Right     Current Outpatient Medications  Medication Sig Dispense Refill  . aspirin 81 MG tablet Take 81 mg by mouth daily.      . irbesartan-hydrochlorothiazide (AVALIDE) 150-12.5 MG per tablet Take 1 tablet by mouth every morning.     . Multiple Vitamin (MULTIVITAMIN) capsule Take 1 capsule by mouth daily.      . naproxen sodium (ANAPROX) 220 MG tablet Take 220 mg by mouth 2 (two) times daily with a meal.    . vitamin E 400 UNIT capsule Take 400 Units by mouth daily.      No current facility-administered medications for this visit.     Allergies  Allergen Reactions  . Codeine Other (See Comments)    constipation      Review of Systems negative except from HPI and PMH  Physical Exam   BP 124/78   Pulse 60   Ht 6' (1.829 m)   Wt 199 lb 3.2 oz (90.4 kg)   SpO2 97%   BMI 27.02 kg/m   Well developed and well nourished in no acute distress HENT normal Neck supple with JVP-flat Clear Device pocket well healed; without hematoma or erythema.  There is no tethering  Regular rate and rhythm, no murmur Abd-soft with active BS No Clubbing cyanosis tr edema Skin-warm and dry A & Oriented  Grossly normal sensory and motor function  ECG P-synchronous/ AV  pacing   Assessment and  Plan  Syncope  Sinus node dysfunction  Hypertension  Complete heart block  Pacemaker-[employer] Jude   Intermittent CHB  Will keep him programmed VIP to allow for intermittent intrinsic conduction ( over the last year only about 15% V pacing   Stress withcaring for his wife  Gave him OCBook      Current medicines are reviewed at length with the patient today .  The patient does not  have concerns regarding medicines.

## 2019-05-04 NOTE — Patient Instructions (Addendum)
Medication Instructions:  Your physician recommends that you continue on your current medications as directed. Please refer to the Current Medication list given to you today.  *If you need a refill on your cardiac medications before your next appointment, please call your pharmacy*  Labwork: None ordered  Testing/Procedures: None ordered  Follow-Up: Remote monitoring is used to monitor your Pacemaker or ICD from home. This monitoring reduces the number of office visits required to check your device to one time per year. It allows [place] to keep an eye on the functioning of your device to ensure it is working properly. You are scheduled for a device check from home on 06/28/19. You may send your transmission at any time that day. If you have a wireless device, the transmission will be sent automatically. After your physician reviews your transmission, you will receive a postcard with your next transmission date.  Your physician wants you to follow-up in: 1 year with Dr. Caryl Comes.  You will receive a reminder letter in the mail two months in advance. If you don't receive a letter, please call our office to schedule the follow-up appointment.  Thank you for choosing [facility]!!    Any Other Special Instructions Will Be Listed Below (If Applicable).

## 2019-05-05 LAB — CUP PACEART INCLINIC DEVICE CHECK
Battery Remaining Longevity: 117 mo
Battery Voltage: 2.99 V
Brady Statistic RA Percent Paced: 82 %
Brady Statistic RV Percent Paced: 16 %
Date Time Interrogation Session: 20201006171927
Implantable Lead Implant Date: 20181126
Implantable Lead Implant Date: 20181126
Implantable Lead Location: 753859
Implantable Lead Location: 753860
Implantable Lead Model: 5076
Implantable Lead Model: 5076
Implantable Pulse Generator Implant Date: 20181126
Lead Channel Impedance Value: 525 Ohm
Lead Channel Impedance Value: 562.5 Ohm
Lead Channel Pacing Threshold Amplitude: 0.75 V
Lead Channel Pacing Threshold Amplitude: 0.75 V
Lead Channel Pacing Threshold Pulse Width: 0.5 ms
Lead Channel Pacing Threshold Pulse Width: 0.5 ms
Lead Channel Sensing Intrinsic Amplitude: 4.5 mV
Lead Channel Setting Pacing Amplitude: 2 V
Lead Channel Setting Pacing Amplitude: 2.5 V
Lead Channel Setting Pacing Pulse Width: 0.5 ms
Lead Channel Setting Sensing Sensitivity: 2 mV
Pulse Gen Model: 2272
Pulse Gen Serial Number: 8971004

## 2019-06-25 LAB — CUP PACEART REMOTE DEVICE CHECK
Battery Remaining Longevity: 106 mo
Battery Remaining Percentage: 95.5 %
Battery Voltage: 2.99 V
Brady Statistic AP VP Percent: 84 %
Brady Statistic AP VS Percent: 1 %
Brady Statistic AS VP Percent: 16 %
Brady Statistic AS VS Percent: 1 %
Brady Statistic RA Percent Paced: 83 %
Brady Statistic RV Percent Paced: 99 %
Date Time Interrogation Session: 20201125020727
Implantable Lead Implant Date: 20181126
Implantable Lead Implant Date: 20181126
Implantable Lead Location: 753859
Implantable Lead Location: 753860
Implantable Lead Model: 5076
Implantable Lead Model: 5076
Implantable Pulse Generator Implant Date: 20181126
Lead Channel Impedance Value: 540 Ohm
Lead Channel Impedance Value: 590 Ohm
Lead Channel Pacing Threshold Amplitude: 0.75 V
Lead Channel Pacing Threshold Amplitude: 0.75 V
Lead Channel Pacing Threshold Pulse Width: 0.5 ms
Lead Channel Pacing Threshold Pulse Width: 0.5 ms
Lead Channel Sensing Intrinsic Amplitude: 12 mV
Lead Channel Sensing Intrinsic Amplitude: 5 mV
Lead Channel Setting Pacing Amplitude: 2 V
Lead Channel Setting Pacing Amplitude: 2.5 V
Lead Channel Setting Pacing Pulse Width: 0.5 ms
Lead Channel Setting Sensing Sensitivity: 2 mV
Pulse Gen Model: 2272
Pulse Gen Serial Number: 8971004

## 2019-06-28 ENCOUNTER — Ambulatory Visit (INDEPENDENT_AMBULATORY_CARE_PROVIDER_SITE_OTHER): Payer: Medicare Other | Admitting: *Deleted

## 2019-06-28 DIAGNOSIS — R001 Bradycardia, unspecified: Secondary | ICD-10-CM | POA: Diagnosis not present

## 2019-07-21 NOTE — Progress Notes (Signed)
PPM remote 

## 2019-08-23 ENCOUNTER — Ambulatory Visit: Payer: Medicare PPO | Admitting: Podiatry

## 2019-08-24 ENCOUNTER — Ambulatory Visit: Payer: Medicare Other

## 2019-09-02 ENCOUNTER — Ambulatory Visit: Payer: Medicare PPO | Attending: Internal Medicine

## 2019-09-02 DIAGNOSIS — Z23 Encounter for immunization: Secondary | ICD-10-CM | POA: Insufficient documentation

## 2019-09-02 NOTE — Progress Notes (Signed)
   Covid-19 Vaccination Clinic  Name:  Jorge Ross    MRN: [id number] DOB: 1940-11-11  09/02/2019  Mr. Jorge Ross was observed post Covid-19 immunization for 30 minutes based on pre-vaccination screening without incidence. He was provided with Vaccine Information Sheet and instruction to access the V-Safe system.   Mr. Jorge Ross was instructed to call 911 with any severe reactions post vaccine: Marland Kitchen Difficulty breathing  . Swelling of your face and throat  . A fast heartbeat  . A bad rash all over your body  . Dizziness and weakness    Immunizations Administered    Name Date Dose VIS Date Route   Pfizer COVID-19 Vaccine 09/02/2019 11:43 AM 0.3 mL 07/09/2019 Intramuscular   Manufacturer: [place]   Lot: [id number]   [place]: [id number]

## 2019-09-27 ENCOUNTER — Ambulatory Visit: Payer: Medicare PPO | Attending: Internal Medicine

## 2019-09-27 ENCOUNTER — Ambulatory Visit (INDEPENDENT_AMBULATORY_CARE_PROVIDER_SITE_OTHER): Payer: Medicare PPO | Admitting: *Deleted

## 2019-09-27 DIAGNOSIS — Z23 Encounter for immunization: Secondary | ICD-10-CM

## 2019-09-27 DIAGNOSIS — R001 Bradycardia, unspecified: Secondary | ICD-10-CM

## 2019-09-27 LAB — CUP PACEART REMOTE DEVICE CHECK
Battery Remaining Longevity: 104 mo
Battery Remaining Percentage: 95.5 %
Battery Voltage: 2.99 V
Brady Statistic AP VP Percent: 84 %
Brady Statistic AP VS Percent: 1 %
Brady Statistic AS VP Percent: 16 %
Brady Statistic AS VS Percent: 1 %
Brady Statistic RA Percent Paced: 84 %
Brady Statistic RV Percent Paced: 99 %
Date Time Interrogation Session: 20210301020022
Implantable Lead Implant Date: 20181126
Implantable Lead Implant Date: 20181126
Implantable Lead Location: 753859
Implantable Lead Location: 753860
Implantable Lead Model: 5076
Implantable Lead Model: 5076
Implantable Pulse Generator Implant Date: 20181126
Lead Channel Impedance Value: 510 Ohm
Lead Channel Impedance Value: 550 Ohm
Lead Channel Pacing Threshold Amplitude: 0.75 V
Lead Channel Pacing Threshold Amplitude: 0.75 V
Lead Channel Pacing Threshold Pulse Width: 0.5 ms
Lead Channel Pacing Threshold Pulse Width: 0.5 ms
Lead Channel Sensing Intrinsic Amplitude: 3.3 mV
Lead Channel Sensing Intrinsic Amplitude: 3.4 mV
Lead Channel Setting Pacing Amplitude: 2 V
Lead Channel Setting Pacing Amplitude: 2.5 V
Lead Channel Setting Pacing Pulse Width: 0.5 ms
Lead Channel Setting Sensing Sensitivity: 2 mV
Pulse Gen Model: 2272
Pulse Gen Serial Number: 8971004

## 2019-09-27 NOTE — Progress Notes (Signed)
PPM Remote  

## 2019-09-27 NOTE — Progress Notes (Signed)
   Covid-19 Vaccination Clinic  Name:  Jorge Ross    MRN: [id number] DOB: 02-Jun-1941  09/27/2019  Mr. Jorge Ross was observed post Covid-19 immunization for 15 minutes without incidence. He was provided with Vaccine Information Sheet and instruction to access the V-Safe system.   Mr. Jorge Ross was instructed to call 911 with any severe reactions post vaccine: Marland Kitchen Difficulty breathing  . Swelling of your face and throat  . A fast heartbeat  . A bad rash all over your body  . Dizziness and weakness    Immunizations Administered    Name Date Dose VIS Date Route   Pfizer COVID-19 Vaccine 09/27/2019  3:07 PM 0.3 mL 07/09/2019 Intramuscular   Manufacturer: [place]   Lot: [id number]   [place]: [id number]

## 2019-10-26 DIAGNOSIS — N5201 Erectile dysfunction due to arterial insufficiency: Secondary | ICD-10-CM | POA: Diagnosis not present

## 2019-12-28 ENCOUNTER — Ambulatory Visit (INDEPENDENT_AMBULATORY_CARE_PROVIDER_SITE_OTHER): Payer: Medicare PPO | Admitting: *Deleted

## 2019-12-28 DIAGNOSIS — R55 Syncope and collapse: Secondary | ICD-10-CM

## 2019-12-29 LAB — CUP PACEART REMOTE DEVICE CHECK
Battery Remaining Longevity: 104 mo
Battery Remaining Percentage: 95.5 %
Battery Voltage: 2.99 V
Brady Statistic AP VP Percent: 83 %
Brady Statistic AP VS Percent: 1 %
Brady Statistic AS VP Percent: 17 %
Brady Statistic AS VS Percent: 1 %
Brady Statistic RA Percent Paced: 83 %
Brady Statistic RV Percent Paced: 99 %
Date Time Interrogation Session: 20210601203143
Implantable Lead Implant Date: 20181126
Implantable Lead Implant Date: 20181126
Implantable Lead Location: 753859
Implantable Lead Location: 753860
Implantable Lead Model: 5076
Implantable Lead Model: 5076
Implantable Pulse Generator Implant Date: 20181126
Lead Channel Impedance Value: 480 Ohm
Lead Channel Impedance Value: 590 Ohm
Lead Channel Pacing Threshold Amplitude: 0.75 V
Lead Channel Pacing Threshold Amplitude: 0.75 V
Lead Channel Pacing Threshold Pulse Width: 0.5 ms
Lead Channel Pacing Threshold Pulse Width: 0.5 ms
Lead Channel Sensing Intrinsic Amplitude: 3.3 mV
Lead Channel Sensing Intrinsic Amplitude: 4.2 mV
Lead Channel Setting Pacing Amplitude: 2 V
Lead Channel Setting Pacing Amplitude: 2.5 V
Lead Channel Setting Pacing Pulse Width: 0.5 ms
Lead Channel Setting Sensing Sensitivity: 2 mV
Pulse Gen Model: 2272
Pulse Gen Serial Number: 8971004

## 2019-12-29 NOTE — Progress Notes (Signed)
Remote pacemaker transmission.   

## 2020-01-05 ENCOUNTER — Telehealth: Payer: Self-pay | Admitting: Internal Medicine

## 2020-01-05 NOTE — Telephone Encounter (Signed)
Patient is not enrolled in ICM clinic and no letter sent regarding ICM.  Call forwarded to device clinic triage for follow up.

## 2020-01-05 NOTE — Telephone Encounter (Signed)
Pt called in regards to a letter he received from Garth Bigness about his recent home remote pacemaker transmission. He would like to speak to [place] or his

## 2020-01-05 NOTE — Telephone Encounter (Signed)
Attempted to return pt phone call, no answer, Left VM with Device clinic phone number/hours to call back.

## 2020-01-07 NOTE — Telephone Encounter (Signed)
Spoke with pt, advised his 6/1 device check was normal.  Next check is in 03/2020

## 2020-01-12 ENCOUNTER — Inpatient Hospital Stay: Payer: Medicare PPO

## 2020-01-12 ENCOUNTER — Other Ambulatory Visit: Payer: Self-pay

## 2020-01-12 ENCOUNTER — Inpatient Hospital Stay: Payer: Medicare PPO | Attending: Oncology | Admitting: Oncology

## 2020-01-12 VITALS — BP 136/70 | HR 78 | Temp 97.7°F | Resp 18 | Ht 72.0 in | Wt 196.9 lb

## 2020-01-12 DIAGNOSIS — Z85038 Personal history of other malignant neoplasm of large intestine: Secondary | ICD-10-CM | POA: Diagnosis not present

## 2020-01-12 DIAGNOSIS — C189 Malignant neoplasm of colon, unspecified: Secondary | ICD-10-CM

## 2020-01-12 DIAGNOSIS — Z85528 Personal history of other malignant neoplasm of kidney: Secondary | ICD-10-CM | POA: Insufficient documentation

## 2020-01-12 DIAGNOSIS — G620 Drug-induced polyneuropathy: Secondary | ICD-10-CM | POA: Diagnosis not present

## 2020-01-12 LAB — CBC WITH DIFFERENTIAL (CANCER CENTER ONLY)
Abs Immature Granulocytes: 0.01 10*3/uL (ref 0.00–0.07)
Basophils Absolute: 0 10*3/uL (ref 0.0–0.1)
Basophils Relative: 0 %
Eosinophils Absolute: 0.1 10*3/uL (ref 0.0–0.5)
Eosinophils Relative: 1 %
HCT: 43.3 % (ref 39.0–52.0)
Hemoglobin: 14.7 g/dL (ref 13.0–17.0)
Immature Granulocytes: 0 %
Lymphocytes Relative: 35 %
Lymphs Abs: 1.8 10*3/uL (ref 0.7–4.0)
MCH: 33 pg (ref 26.0–34.0)
MCHC: 33.9 g/dL (ref 30.0–36.0)
MCV: 97.1 fL (ref 80.0–100.0)
Monocytes Absolute: 0.5 10*3/uL (ref 0.1–1.0)
Monocytes Relative: 9 %
Neutro Abs: 2.9 10*3/uL (ref 1.7–7.7)
Neutrophils Relative %: 55 %
Platelet Count: 186 10*3/uL (ref 150–400)
RBC: 4.46 MIL/uL (ref 4.22–5.81)
RDW: 12.1 % (ref 11.5–15.5)
WBC Count: 5.3 10*3/uL (ref 4.0–10.5)
nRBC: 0 % (ref 0.0–0.2)

## 2020-01-12 LAB — CMP (CANCER CENTER ONLY)
ALT: 15 U/L (ref 0–44)
AST: 18 U/L (ref 15–41)
Albumin: 4.3 g/dL (ref 3.5–5.0)
Alkaline Phosphatase: 63 U/L (ref 38–126)
Anion gap: 9 (ref 5–15)
BUN: 22 mg/dL (ref 8–23)
CO2: 28 mmol/L (ref 22–32)
Calcium: 9.4 mg/dL (ref 8.9–10.3)
Chloride: 105 mmol/L (ref 98–111)
Creatinine: 1.14 mg/dL (ref 0.61–1.24)
GFR, Est AFR Am: >60 mL/min (ref >60)
GFR, Estimated: >60 mL/min (ref >60)
Glucose, Bld: 107 mg/dL — ABNORMAL HIGH (ref 70–99)
Potassium: 3.7 mmol/L (ref 3.5–5.1)
Sodium: 142 mmol/L (ref 135–145)
Total Bilirubin: 0.8 mg/dL (ref 0.3–1.2)
Total Protein: 7.5 g/dL (ref 6.5–8.1)

## 2020-01-12 LAB — CEA (IN HOUSE-CHCC): CEA (CHCC-In House): 1.8 ng/mL (ref 0.00–5.00)

## 2020-01-12 NOTE — Progress Notes (Signed)
Hematology and Oncology Follow Up Visit  Jorge Ross [id number] 14-Dec-1940 79 y.o. 01/12/2020 9:44 AM Jorge Ross, MDPolite, Jorge Moll, MD   Principle Diagnosis: 79 year old with stage III (T1N1) colon cancer diagnosed in 2015.  He was found to have 1 out of 12 lymph nodes.   Prior Therapy:  He is status post right colectomy done laparoscopically on 04/01/2014. He had a T1 N1 disease. 1 out of 12 lymph nodes involved.   He also status post partial nephrectomy on 04/01/2014 for a papillary tumor.  FOLFOX adjuvant chemotherapy cycle 1 to given on 05/31/2014. Status post 11 cycles completed on 11/28/2014.  Current therapy: Active surveillance.  Interim History:  Jorge Ross is here for a follow-up visit.  Since last visit, he reports no major changes in his health.  He continues to feel well without any recent hospitalization or illnesses.  He denies any chest pain or shortness of breath.  He denies any abdominal pain or discomfort.  He denies any cardiac arrhythmias or respiratory issues.  He denies any changes in his bowel habits or hematochezia.  He is acting as the caregiver for his wife who has a lot of health issues at this time.     Medications: Unchanged on review. Current Outpatient Medications  Medication Sig Dispense Refill  . aspirin 81 MG tablet Take 81 mg by mouth daily.      . irbesartan-hydrochlorothiazide (AVALIDE) 150-12.5 MG per tablet Take 1 tablet by mouth every morning.     . Multiple Vitamin (MULTIVITAMIN) capsule Take 1 capsule by mouth daily.      . naproxen sodium (ANAPROX) 220 MG tablet Take 220 mg by mouth 2 (two) times daily with a meal.    . vitamin E 400 UNIT capsule Take 400 Units by mouth daily.      No current facility-administered medications for this visit.     Allergies:  Allergies  Allergen Reactions  . Codeine Other (See Comments)    constipation       Physical Exam: Blood pressure 136/70, pulse 78, temperature 97.7 F (36.5 C),  temperature source Temporal, resp. rate 18, height 6' (1.829 m), weight 196 lb 14.4 oz (89.3 kg), SpO2 98 %.    ECOG: 0    General appearance: Alert, awake without any distress. Head: Atraumatic without abnormalities Oropharynx: Without any thrush or ulcers. Eyes: No scleral icterus. Lymph nodes: No lymphadenopathy noted in the cervical, supraclavicular, or axillary nodes Heart:regular rate and rhythm, without any murmurs or gallops.   Lung: Clear to auscultation without any rhonchi, wheezes or dullness to percussion. Abdomin: Soft, nontender without any shifting dullness or ascites. Musculoskeletal: No clubbing or cyanosis. Neurological: No motor or sensory deficits. Skin: No rashes or lesions.   Lab Results: Lab Results  Component Value Date   WBC 4.7 01/05/2019   HGB 14.0 01/05/2019   HCT 42.1 01/05/2019   MCV 97.5 01/05/2019   PLT 174 01/05/2019     Chemistry      Component Value Date/Time   NA 141 01/05/2019 1000   NA 140 01/02/2017 0954   K 4.7 01/05/2019 1000   K 4.2 01/02/2017 0954   CL 102 01/05/2019 1000   CO2 28 01/05/2019 1000   CO2 28 01/02/2017 0954   BUN 21 01/05/2019 1000   BUN 26.4 (H) 01/02/2017 0954   CREATININE 1.16 01/05/2019 1000   CREATININE 1.1 01/02/2017 0954      Component Value Date/Time   CALCIUM 9.3 01/05/2019 1000   CALCIUM 9.7  01/02/2017 0954   ALKPHOS 66 01/05/2019 1000   ALKPHOS 65 01/02/2017 0954   AST 16 01/05/2019 1000   AST 19 01/02/2017 0954   ALT 14 01/05/2019 1000   ALT 17 01/02/2017 0954   BILITOT 0.8 01/05/2019 1000   BILITOT 0.75 01/02/2017 0954        Impression and Plan:   79 year old man with:  1.  Stage III colon cancer diagnosed in 2015.  He is status post therapy outlined above including surgical resection and adjuvant chemotherapy and currently on active surveillance.  The natural course of his disease was reviewed and risk of relapse was assessed.  He is over 5 years out from his surgical resection  and adjuvant chemotherapy.  His risk of relapse is very low at this time.  He is at risk of developing a different primary however.  No further oncology follow-up is needed at this time.  2. Renal cell carcinoma: He status post surgical resection of a papillary tumor in 2015 without any evidence of relapse at this time.  The natural course of this disease was discussed today risk of relapse was assessed.  He has low risk of developing recurrent disease at this time and I do not recommend any repeat imaging currently.  3. Colonoscopy surveillance: He continues to be up-to-date on his colonoscopy screening which I encouraged him to do so.   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  5.  Covid vaccination considerations: He is up-to-date and has completed the vaccination series.  6. Follow-up: As needed in the future without any routine oncology follow-up at this time.   30  minutes were dedicated to this visit. The time was spent on updating his disease status, discussing the natural course of both malignancies, treatment options and future plan of care discussion.    Zola Button, MD 6/16/20219:44 AM

## 2020-01-14 ENCOUNTER — Encounter (HOSPITAL_COMMUNITY): Payer: Self-pay

## 2020-01-14 ENCOUNTER — Emergency Department (HOSPITAL_COMMUNITY)
Admission: EM | Admit: 2020-01-14 | Discharge: 2020-01-15 | Disposition: A | Discharge status: Home / Self Care | Payer: Medicare PPO | Attending: Emergency Medicine | Admitting: Emergency Medicine

## 2020-01-14 ENCOUNTER — Other Ambulatory Visit: Payer: Self-pay

## 2020-01-14 ENCOUNTER — Emergency Department (HOSPITAL_COMMUNITY): Payer: Medicare PPO

## 2020-01-14 DIAGNOSIS — Y92019 Unspecified place in single-family (private) house as the place of occurrence of the external cause: Secondary | ICD-10-CM | POA: Diagnosis not present

## 2020-01-14 DIAGNOSIS — Z23 Encounter for immunization: Secondary | ICD-10-CM | POA: Insufficient documentation

## 2020-01-14 DIAGNOSIS — Z950 Presence of cardiac pacemaker: Secondary | ICD-10-CM | POA: Insufficient documentation

## 2020-01-14 DIAGNOSIS — Y9389 Activity, other specified: Secondary | ICD-10-CM | POA: Diagnosis not present

## 2020-01-14 DIAGNOSIS — I252 Old myocardial infarction: Secondary | ICD-10-CM | POA: Diagnosis not present

## 2020-01-14 DIAGNOSIS — N189 Chronic kidney disease, unspecified: Secondary | ICD-10-CM | POA: Diagnosis not present

## 2020-01-14 DIAGNOSIS — S61214A Laceration without foreign body of right ring finger without damage to nail, initial encounter: Secondary | ICD-10-CM | POA: Diagnosis not present

## 2020-01-14 DIAGNOSIS — I129 Hypertensive chronic kidney disease with stage 1 through stage 4 chronic kidney disease, or unspecified chronic kidney disease: Secondary | ICD-10-CM | POA: Diagnosis not present

## 2020-01-14 DIAGNOSIS — Z7982 Long term (current) use of aspirin: Secondary | ICD-10-CM | POA: Insufficient documentation

## 2020-01-14 DIAGNOSIS — Z85038 Personal history of other malignant neoplasm of large intestine: Secondary | ICD-10-CM | POA: Insufficient documentation

## 2020-01-14 DIAGNOSIS — S6991XA Unspecified injury of right wrist, hand and finger(s), initial encounter: Secondary | ICD-10-CM | POA: Diagnosis not present

## 2020-01-14 DIAGNOSIS — W231XXA Caught, crushed, jammed, or pinched between stationary objects, initial encounter: Secondary | ICD-10-CM | POA: Diagnosis not present

## 2020-01-14 DIAGNOSIS — Z79899 Other long term (current) drug therapy: Secondary | ICD-10-CM | POA: Insufficient documentation

## 2020-01-14 DIAGNOSIS — Z85528 Personal history of other malignant neoplasm of kidney: Secondary | ICD-10-CM | POA: Diagnosis not present

## 2020-01-14 DIAGNOSIS — Y999 Unspecified external cause status: Secondary | ICD-10-CM | POA: Insufficient documentation

## 2020-01-14 MED ORDER — LIDOCAINE-EPINEPHRINE (PF) 2 %-1:200000 IJ SOLN
INTRAMUSCULAR | Status: AC
  Administered 2020-01-14: 20 mL
  Filled 2020-01-14: qty 20

## 2020-01-14 MED ORDER — LIDOCAINE-EPINEPHRINE (PF) 2 %-1:200000 IJ SOLN: 20.0000 mL | Freq: Once | INTRAMUSCULAR | Status: AC

## 2020-01-14 MED ORDER — TETANUS-DIPHTH-ACELL PERTUSSIS 5-2.5-18.5 LF-MCG/0.5 IM SUSP
0.5000 mL | Freq: Once | INTRAMUSCULAR | Status: AC
  Administered 2020-01-14: 0.5 mL via INTRAMUSCULAR
  Filled 2020-01-14: qty 0.5

## 2020-01-14 MED ORDER — LIDOCAINE-EPINEPHRINE 2 %-1:100000 IJ SOLN: 20.0000 mL | Freq: Once | INTRAMUSCULAR | Status: DC

## 2020-01-14 NOTE — ED Provider Notes (Signed)
[facility] DEPT Provider Note   CSN: [id number] Arrival date & time: 01/14/20  2102     History Chief Complaint  Patient presents with  . Laceration    Jorge Ross is a 79 y.o. male HTN, CKD, MI who presents to the emergency department with a right fourth finger laceration.  The patient developed a right fourth finger laceration earlier tonight when he pinched the finger while repositioning a recliner.  He applied alcohol and rinse the area with water and applied a dressing at home prior to arrival in the ER.  No other treatment prior to arrival.  He denies numbness, weakness, severe pain in the right ring finger or hand.  No prior right ring finger or hand surgery.  He is unsure when his Tdap was last updated.  The history is provided by the patient and medical records. No language interpreter was used.       Past Medical History:  Diagnosis Date  . Arthritis    HIPS  . Chronic kidney disease   . Colon cancer ([place]) 2015  . Hypertension   . Myocardial infarction ([place])   . 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 Unspecified disorder of intestine     Patient Active Problem List   Diagnosis Date Noted  . Symptomatic bradycardia 06/23/2017  . Port catheter in place 01/02/2016  . Colon cancer ([place]) 05/11/2014  . Left kidney mass s/p partial nephrectomy 04/02/2014  . 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 Occult blood in stools 02/10/2014  . Hypertension 12/04/2010    Past Surgical History:  Procedure Laterality Date  . CERVICAL SPINE SURGERY     3 titamum screws  . COLECTOMY  03-2014  . COLON SURGERY  04/01/14  . COLONOSCOPY  2007   normal   . CYSTOSCOPY     03-15-16  . HERNIA REPAIR Bilateral    x 3 total   . PACEMAKER IMPLANT N/A 06/23/2017   Procedure: PACEMAKER IMPLANT;  Surgeon: Deboraha Sprang, MD;  Location: [facility];  Service: Cardiovascular;  Laterality: N/A;  . PORT-A-CATH REMOVAL N/A  02/21/2016   Procedure: REMOVAL PORT-A-CATH;  Surgeon: Stark Klein, MD;  Location: [facility];  Service: General;  Laterality: N/A;  . PORTACATH PLACEMENT N/A 05/24/2014   Procedure: INSERTION PORT-A-CATH;  Surgeon: Stark Klein, MD;  Location: [facility];  Service: General;  Laterality: N/A;  . ROBOT ASSISTED LAPAROSCOPIC NEPHRECTOMY Left 04/01/2014   Procedure: ROBOTIC ASSISTED LAPAROSCOPIC LEFT PARTIAL NEPHRECTOMY;  Surgeon: Alexis Frock, MD;  Location: [facility];  Service: Urology;  Laterality: Left;  . ROTATOR CUFF REPAIR Left   . thumb ligament surgery Right        Family History  Problem Relation Age of Onset  . Prostate cancer Paternal Uncle   . Lung cancer Paternal Uncle        smoker  . Colon cancer Neg Hx   . Rectal cancer Neg Hx   . Esophageal cancer Neg Hx   . Stomach cancer Neg Hx     Social History   Tobacco Use  . Smoking status: Never Smoker  . Smokeless tobacco: Never Used  Vaping Use  . Vaping Use: Never used  Substance Use Topics  . Alcohol use: Not Currently    Comment: rare  . Drug use: No    Home Medications Prior to Admission medications   Medication Sig Start Date End Date Taking? Authorizing Provider  aspirin 81  MG tablet Take 81 mg by mouth daily.      [provider]  irbesartan-hydrochlorothiazide (AVALIDE) 150-12.5 MG per tablet Take 1 tablet by mouth every morning.  01/25/14   [provider]  Multiple Vitamin (MULTIVITAMIN) capsule Take 1 capsule by mouth daily.      [provider]  naproxen sodium (ANAPROX) 220 MG tablet Take 220 mg by mouth 2 (two) times daily with a meal.    [provider]  vitamin E 400 UNIT capsule Take 400 Units by mouth daily.     [provider]    Allergies    Codeine  Review of Systems   Review of Systems  Constitutional: Negative for activity change, chills and fever.  Respiratory: Negative for shortness of breath.   Cardiovascular: Negative for chest  pain.  Gastrointestinal: Negative for abdominal pain.  Musculoskeletal: Negative for back pain.  Skin: Positive for wound. Negative for color change and rash.  Neurological: Negative for weakness and numbness.    Physical Exam Updated Vital Signs BP 134/77 (BP Location: Left Arm)   Pulse 60   Temp 98 F (36.7 C) (Oral)   Resp 15   Ht 5' 11.5" (1.816 m)   Wt 88.9 kg   SpO2 99%   BMI 26.96 kg/m   Physical Exam Vitals and nursing note reviewed.  Constitutional:      Appearance: He is well-developed.  HENT:     Head: Normocephalic.  Eyes:     Conjunctiva/sclera: Conjunctivae normal.  Cardiovascular:     Rate and Rhythm: Normal rate and regular rhythm.     Heart sounds: No murmur heard.   Pulmonary:     Effort: Pulmonary effort is normal.  Abdominal:     General: There is no distension.     Palpations: Abdomen is soft.  Musculoskeletal:     Cervical back: Neck supple.  Skin:    General: Skin is warm and dry.     Comments: Jagged, 1.5 cm laceration noted to the palmar tip of the right fourth ring finger.  Minimal oozing noted to the wound.  No obvious foreign bodies.  Full active and passive range of motion of the digit.  Sensation is intact to all 4 distal tips of the digit.  The nailbed is not involved.  Good capillary refill.  Neurological:     Mental Status: He is alert.  Psychiatric:        Behavior: Behavior normal.            ED Results / Procedures / Treatments   Labs (all labs ordered are listed, but only abnormal results are displayed) Labs Reviewed - No data to display  EKG None  Radiology DG Finger Ring Right  Result Date: 01/14/2020 CLINICAL DATA:  Status post trauma. EXAM: RIGHT RING FINGER 2+V COMPARISON:  None. FINDINGS: There is no evidence of fracture or dislocation. There is no evidence of arthropathy or other focal bone abnormality. A small superficial soft tissue defect is seen along the distal tip of the fourth right finger.  IMPRESSION: Small superficial soft tissue defect without evidence of an acute osseous abnormality. Electronically Signed   By: Virgina Norfolk M.D.   On: 01/14/2020 22:11    Procedures .Marland KitchenLaceration Repair  Date/Time: 01/15/2020 12:53 AM Performed by: Joanne Gavel, PA-C Authorized by: Joanne Gavel, PA-C   Consent:    Consent obtained:  Verbal   Consent given by:  Patient   Risks discussed:  Infection, pain, poor  cosmetic result, tendon damage, poor wound healing and nerve damage Anesthesia (see MAR for exact dosages):    Anesthesia method:  Local infiltration   Local anesthetic:  Lidocaine 2% WITH epi Laceration details:    Location:  Finger   Finger location:  R ring finger   Length (cm):  1.5 Repair type:    Repair type:  Simple Pre-procedure details:    Preparation:  Patient was prepped and draped in usual sterile fashion and imaging obtained to evaluate for foreign bodies Exploration:    Hemostasis achieved with:  Epinephrine and direct pressure   Wound exploration: wound explored through full range of motion and entire depth of wound probed and visualized     Wound extent: no fascia violation noted, no foreign bodies/material noted, no muscle damage noted, no nerve damage noted, no tendon damage noted, no underlying fracture noted and no vascular damage noted     Contaminated: no   Treatment:    Area cleansed with:  Shur-Clens   Amount of cleaning:  Extensive   Irrigation method:  Pressure wash   Visualized foreign bodies/material removed: no   Skin repair:    Repair method:  Sutures   Suture size:  5-0   Suture material:  Prolene   Suture technique:  Simple interrupted   Number of sutures:  5 Approximation:    Approximation:  Close Post-procedure details:    Dressing:  Sterile dressing, antibiotic ointment and splint for protection   Patient tolerance of procedure:  Tolerated well, no immediate complications   (including critical care time)  Medications  Ordered in ED Medications  Tdap (BOOSTRIX) injection 0.5 mL (0.5 mLs Intramuscular Given 01/14/20 2312)  lidocaine-EPINEPHrine (XYLOCAINE W/EPI) 2 %-1:200000 (PF) injection 20 mL (20 mLs Infiltration Given by Other 01/14/20 2312)    ED Course  I have reviewed the triage vital signs and the nursing notes.  Pertinent labs & imaging results that were available during my care of the patient were reviewed by me and considered in my medical decision making (see chart for details).  Clinical Course as of Jan 15 55  Fri Jan 13, 9758  7751 79 year old male here with injury to right ring finger after he caught it in a recliner.  Is a curvilinear laceration at the distal phalanx.  X-ray not demonstrating any acute fracture.  Will get primary wound repair.   [MB]    Clinical Course User Index [MB] Hayden Rasmussen, MD   MDM Rules/Calculators/A&P                          79 year old male presenting to the emergency department with a laceration noted to the palmar aspect of the right fourth distal ring finger.  The patient was seen and independently evaluated by Dr. Melina Copa, attending physician.  X-ray of the digit is unremarkable.  No evidence of foreign body or fracture when reviewed by me.  Patient is unsure when Tdap was last updated.  Pressure irrigation performed. Wound explored and base of wound visualized in a bloodless field without evidence of foreign body.  Laceration occurred < 8 hours prior to repair which was well tolerated. Tdap updated.  Pt has no comorbidities to effect normal wound healing. Pt discharged without antibiotics.  Discussed suture home care with patient and answered questions. Pt to follow-up for wound check and suture removal in 7 days; they are to return to the ED sooner for signs of infection. Pt is hemodynamically stable  with no complaints prior to dc.   Final Clinical Impression(s) / ED Diagnoses Final diagnoses:  Laceration of right ring finger without foreign body  without damage to nail, initial encounter    Rx / DC Orders ED Discharge Orders    None       Joanne Gavel, PA-C 01/15/20 0056    Hayden Rasmussen, MD 01/15/20 1120

## 2020-01-14 NOTE — Discharge Instructions (Addendum)
Thank you for allowing me to care for you today in the Emergency Department.   Your stitches need to be removed in 7 days.  You can have them removed at your primary care provider's office, urgent care, or by returning to the ER.  You can take 650 mg of Tylenol once every 6 hours as needed for pain.  Keep the finger clean and dry for the next 24 hours.  Then, you can gently wash the area by letting warm water and soap run over it.  You can apply a topical antibiotic such as bacitracin or Neosporin directly to the skin and then cover it with a clean gauze dressing.  You should change the dressing at least once daily or anytime that he gets soiled.  You can have a small amount of drainage from the finger over the next 2 days.  You can wear the splint as needed to protect the finger, but he did not have to keep it on 24 hours a day since the wound does not cross the joint.  Your tetanus immunization was updated today and is good for the next 10 years.  You should return to the emergency department if you develop fever, chills, if the finger gets red, very swollen, warm to the touch, or if you develop red streaking down the finger or hand, or other new, concerning symptoms.

## 2020-01-14 NOTE — ED Triage Notes (Signed)
Pt reports that he got his R 4th finger pinched when he was moving a recliner. Bleeding controlled with a bandaid.

## 2020-01-28 DIAGNOSIS — S61214D Laceration without foreign body of right ring finger without damage to nail, subsequent encounter: Secondary | ICD-10-CM | POA: Diagnosis not present

## 2020-01-28 DIAGNOSIS — I10 Essential (primary) hypertension: Secondary | ICD-10-CM | POA: Diagnosis not present

## 2020-03-07 DIAGNOSIS — Z803 Family history of malignant neoplasm of breast: Secondary | ICD-10-CM | POA: Diagnosis not present

## 2020-03-07 DIAGNOSIS — Z791 Long term (current) use of non-steroidal anti-inflammatories (NSAID): Secondary | ICD-10-CM | POA: Diagnosis not present

## 2020-03-07 DIAGNOSIS — Z95810 Presence of automatic (implantable) cardiac defibrillator: Secondary | ICD-10-CM | POA: Diagnosis not present

## 2020-03-07 DIAGNOSIS — Z7982 Long term (current) use of aspirin: Secondary | ICD-10-CM | POA: Diagnosis not present

## 2020-03-07 DIAGNOSIS — I10 Essential (primary) hypertension: Secondary | ICD-10-CM | POA: Diagnosis not present

## 2020-03-07 DIAGNOSIS — I252 Old myocardial infarction: Secondary | ICD-10-CM | POA: Diagnosis not present

## 2020-03-07 DIAGNOSIS — Z85528 Personal history of other malignant neoplasm of kidney: Secondary | ICD-10-CM | POA: Diagnosis not present

## 2020-03-07 DIAGNOSIS — G629 Polyneuropathy, unspecified: Secondary | ICD-10-CM | POA: Diagnosis not present

## 2020-03-27 ENCOUNTER — Ambulatory Visit (INDEPENDENT_AMBULATORY_CARE_PROVIDER_SITE_OTHER): Payer: Medicare PPO | Admitting: *Deleted

## 2020-03-27 DIAGNOSIS — I442 Atrioventricular block, complete: Secondary | ICD-10-CM | POA: Diagnosis not present

## 2020-03-27 LAB — CUP PACEART REMOTE DEVICE CHECK
Battery Remaining Longevity: 105 mo
Battery Remaining Percentage: 95.5 %
Battery Voltage: 2.99 V
Brady Statistic AP VP Percent: 83 %
Brady Statistic AP VS Percent: 1 %
Brady Statistic AS VP Percent: 17 %
Brady Statistic AS VS Percent: 1 %
Brady Statistic RA Percent Paced: 83 %
Brady Statistic RV Percent Paced: 99 %
Date Time Interrogation Session: 20210830071719
Implantable Lead Implant Date: 20181126
Implantable Lead Implant Date: 20181126
Implantable Lead Location: 753859
Implantable Lead Location: 753860
Implantable Lead Model: 5076
Implantable Lead Model: 5076
Implantable Pulse Generator Implant Date: 20181126
Lead Channel Impedance Value: 510 Ohm
Lead Channel Impedance Value: 590 Ohm
Lead Channel Pacing Threshold Amplitude: 0.75 V
Lead Channel Pacing Threshold Amplitude: 0.75 V
Lead Channel Pacing Threshold Pulse Width: 0.5 ms
Lead Channel Pacing Threshold Pulse Width: 0.5 ms
Lead Channel Sensing Intrinsic Amplitude: 3.3 mV
Lead Channel Sensing Intrinsic Amplitude: 4.7 mV
Lead Channel Setting Pacing Amplitude: 2 V
Lead Channel Setting Pacing Amplitude: 2.5 V
Lead Channel Setting Pacing Pulse Width: 0.5 ms
Lead Channel Setting Sensing Sensitivity: 2 mV
Pulse Gen Model: 2272
Pulse Gen Serial Number: 8971004

## 2020-03-28 NOTE — Progress Notes (Signed)
Remote pacemaker transmission.   

## 2020-05-02 ENCOUNTER — Ambulatory Visit: Payer: Medicare PPO | Attending: Internal Medicine

## 2020-05-02 DIAGNOSIS — Z23 Encounter for immunization: Secondary | ICD-10-CM

## 2020-05-02 NOTE — Progress Notes (Signed)
   Covid-19 Vaccination Clinic  Name:  Jorge Ross    MRN: [id number] DOB: 11-Dec-1940  05/02/2020  [name] was observed post Covid-19 immunization for 15 minutes without incident. He was provided with Vaccine Information Sheet and instruction to access the V-Safe system.   [name] was instructed to call 911 with any severe reactions post vaccine: Marland Kitchen Difficulty breathing  . Swelling of face and throat  . A fast heartbeat  . A bad rash all over body  . Dizziness and weakness

## 2020-05-12 ENCOUNTER — Other Ambulatory Visit: Payer: Self-pay

## 2020-05-12 ENCOUNTER — Ambulatory Visit (INDEPENDENT_AMBULATORY_CARE_PROVIDER_SITE_OTHER): Payer: Medicare PPO | Admitting: Nurse Practitioner

## 2020-05-12 ENCOUNTER — Encounter: Payer: Self-pay | Admitting: Nurse Practitioner

## 2020-05-12 VITALS — BP 110/60 | HR 61 | Ht 71.5 in | Wt 195.0 lb

## 2020-05-12 DIAGNOSIS — I10 Essential (primary) hypertension: Secondary | ICD-10-CM | POA: Diagnosis not present

## 2020-05-12 DIAGNOSIS — I442 Atrioventricular block, complete: Secondary | ICD-10-CM | POA: Diagnosis not present

## 2020-05-12 NOTE — Progress Notes (Signed)
Electrophysiology Office Note Date: 05/12/2020  ID:  Jorge, Ross 02-12-1941, MRN [id number]  PCP: Seward Carol, MD Electrophysiologist: Caryl Comes  CC: Pacemaker follow-up  Jorge Ross is a 79 y.o. male seen today for Dr Caryl Comes.  He presents today for routine electrophysiology followup.  Since last being seen in our clinic, the patient reports doing very well.  He denies chest pain, palpitations, dyspnea, PND, orthopnea, nausea, vomiting, dizziness, syncope, edema, weight gain, or early satiety.     Past Medical History:  Diagnosis Date  . Arthritis    HIPS  . Chronic kidney disease   . Colon cancer ([facility]) 2015  . Hypertension   . Myocardial infarction ([place])   . 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 Unspecified disorder of intestine    Past Surgical History:  Procedure Laterality Date  . CERVICAL SPINE SURGERY     3 titamum screws  . COLECTOMY  03-2014  . COLON SURGERY  04/01/14  . COLONOSCOPY  2007   normal   . CYSTOSCOPY     03-15-16  . HERNIA REPAIR Bilateral    x 3 total   . PACEMAKER IMPLANT N/A 06/23/2017   Procedure: PACEMAKER IMPLANT;  Surgeon: Deboraha Sprang, MD;  Location: [facility];  Service: Cardiovascular;  Laterality: N/A;  . PORT-A-CATH REMOVAL N/A 02/21/2016   Procedure: REMOVAL PORT-A-CATH;  Surgeon: Stark Klein, MD;  Location: [facility];  Service: General;  Laterality: N/A;  . PORTACATH PLACEMENT N/A 05/24/2014   Procedure: INSERTION PORT-A-CATH;  Surgeon: Stark Klein, MD;  Location: [facility];  Service: General;  Laterality: N/A;  . ROBOT ASSISTED LAPAROSCOPIC NEPHRECTOMY Left 04/01/2014   Procedure: ROBOTIC ASSISTED LAPAROSCOPIC LEFT PARTIAL NEPHRECTOMY;  Surgeon: Alexis Frock, MD;  Location: [facility];  Service: Urology;  Laterality: Left;  . ROTATOR CUFF REPAIR Left   . thumb ligament surgery Right     Current Outpatient Medications  Medication Sig Dispense Refill  . aspirin 81 MG tablet Take 81 mg by mouth daily.       . irbesartan-hydrochlorothiazide (AVALIDE) 150-12.5 MG per tablet Take 1 tablet by mouth every morning.     . Multiple Vitamin (MULTIVITAMIN) capsule Take 1 capsule by mouth daily.      . naproxen sodium (ANAPROX) 220 MG tablet Take 220 mg by mouth 2 (two) times daily with a meal.    . vitamin E 400 UNIT capsule Take 400 Units by mouth daily.      No current facility-administered medications for this visit.    Allergies:   Codeine   Social History: Social History   Socioeconomic History  . Marital status: Married    Spouse name: Not on file  . Number of children: 1  . Years of education: Not on file  . Highest education level: Not on file  Occupational History  . 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 Smoking status: Never Smoker  . Smokeless tobacco: Never Used  Vaping Use  . Vaping Use: Never used  Substance and Sexual Activity  . Alcohol use: Not Currently    Comment: rare  . Drug use: No  . Sexual activity: Not on file  Other Topics Concern  . Not on file  Social History Narrative  . Not on file   Social Determinants of Health   Financial Resource Strain:   . Difficulty of Paying Living Expenses: Not on file  Food Insecurity:   .  Worried About Charity fundraiser in the Last Year: Not on file  . Ran Out of Food in the Last Year: Not on file  Transportation Needs:   . Lack of Transportation (Medical): Not on file  . Lack of Transportation (Non-Medical): Not on file  Physical Activity:   . Days of Exercise per Week: Not on file  . Minutes of Exercise per Session: Not on file  Stress:   . Feeling of Stress : Not on file  Social Connections:   . Frequency of Communication with Friends and Family: Not on file  . Frequency of Social Gatherings with Friends and Family: Not on file  . Attends Religious Services: Not on file  . Active Member of Clubs or Organizations: Not on file  . Attends Archivist Meetings: Not on  file  . Marital Status: Not on file  Intimate Partner Violence:   . Fear of Current or Ex-Partner: Not on file  . Emotionally Abused: Not on file  . Physically Abused: Not on file  . Sexually Abused: Not on file    Family History: Family History  Problem Relation Age of Onset  . Prostate cancer Paternal Uncle   . Lung cancer Paternal Uncle        smoker  . Colon cancer Neg Hx   . Rectal cancer Neg Hx   . Esophageal cancer Neg Hx   . Stomach cancer Neg Hx      Review of Systems: All other systems reviewed and are otherwise negative except as noted above.   Physical Exam: VS:  BP 110/60   Pulse 61   Ht 5' 11.5" (1.816 m)   Wt 195 lb (88.5 kg)   SpO2 94%   BMI 26.82 kg/m  , BMI Body mass index is 26.82 kg/m.  GEN- The patient is well appearing, alert and oriented x 3 today.   HEENT: normocephalic, atraumatic; sclera clear, conjunctiva pink; hearing intact; oropharynx clear; neck supple  Lungs- Clear to ausculation bilaterally, normal work of breathing.  No wheezes, rales, rhonchi Heart- Regular rate and rhythm (paced) GI- soft, non-tender, non-distended, bowel sounds present  Extremities- no clubbing, cyanosis, or edema  MS- no significant deformity or atrophy Skin- warm and dry, no rash or lesion; PPM pocket well healed Psych- euthymic mood, full affect Neuro- strength and sensation are intact  PPM Interrogation- reviewed in detail today,  See PACEART report  EKG:  EKG is not ordered today.  Recent Labs: 01/12/2020: ALT 15; BUN 22; Creatinine 1.14; Hemoglobin 14.7; Platelet Count 186; Potassium 3.7; Sodium 142   Wt Readings from Last 3 Encounters:  05/12/20 195 lb (88.5 kg)  01/15/20 196 lb (88.9 kg)  01/12/20 196 lb 14.4 oz (89.3 kg)     Other studies Reviewed: Additional studies/ records that were reviewed today include: Dr Olin Pia office notes  Assessment and Plan:  1.  Intermittent complete heart block Normal PPM function See Pace Art report No  changes today He is pacemaker dependent  2.  HTN Stable No change required today    Current medicines are reviewed at length with the patient today.   The patient does not have concerns regarding his medicines.  The following changes were made today:  none  Labs/ tests ordered today include: none No orders of the defined types were placed in this encounter.    Disposition:   Follow up with remotes, 1 year with Dr Caryl Comes      Signed, Chanetta Marshall, NP 05/12/2020  12:18 PM  [facility] [address] [place] [address] [phone] (office) [phone] (fax)

## 2020-05-12 NOTE — Patient Instructions (Signed)
Medication Instructions:  *If you need a refill on your cardiac medications before your next appointment, please call your pharmacy*  Follow-Up: At [facility], you and your health needs are our priority.  As part of our continuing mission to provide you with exceptional heart care, we have created designated Provider Care Teams.  These Care Teams include your primary Cardiologist (physician) and Advanced Practice Providers (APPs -  Physician Assistants and Nurse Practitioners) who all work together to provide you with the care you need, when you need it.  We recommend signing up for the patient portal called "MyChart".  Sign up information is provided on this After Visit Summary.  MyChart is used to connect with patients for Virtual Visits (Telemedicine).  Patients are able to view lab/test results, encounter notes, upcoming appointments, etc.  Non-urgent messages can be sent to your provider as well.   To learn more about what you can do with MyChart, go to [email or web address].    Your next appointment:   Your physician wants you to follow-up in: 1 YEAR with Dr. Caryl Comes. You will receive a reminder letter in the mail two months in advance. If you don't receive a letter, please call our office to schedule the follow-up appointment.  Remote monitoring is used to monitor your ICD from home. This monitoring reduces the number of office visits required to check your device to one time per year. It allows [place] to keep an eye on the functioning of your device to ensure it is working properly. You are scheduled for a device check from home on 06/26/20. You may send your transmission at any time that day. If you have a wireless device, the transmission will be sent automatically. After your physician reviews your transmission, you will receive a postcard with your next transmission date.  The format for your next appointment:   In Person with Dr. Caryl Comes

## 2020-06-26 ENCOUNTER — Ambulatory Visit (INDEPENDENT_AMBULATORY_CARE_PROVIDER_SITE_OTHER): Payer: Medicare PPO

## 2020-06-26 DIAGNOSIS — R55 Syncope and collapse: Secondary | ICD-10-CM | POA: Diagnosis not present

## 2020-06-26 LAB — CUP PACEART REMOTE DEVICE CHECK
Battery Remaining Longevity: 116 mo
Battery Remaining Percentage: 95.5 %
Battery Voltage: 2.98 V
Brady Statistic AP VP Percent: 83 %
Brady Statistic AP VS Percent: 1 %
Brady Statistic AS VP Percent: 17 %
Brady Statistic AS VS Percent: 1 %
Brady Statistic RA Percent Paced: 81 %
Brady Statistic RV Percent Paced: 99 %
Date Time Interrogation Session: 20211129085832
Implantable Lead Implant Date: 20181126
Implantable Lead Implant Date: 20181126
Implantable Lead Location: 753859
Implantable Lead Location: 753860
Implantable Lead Model: 5076
Implantable Lead Model: 5076
Implantable Pulse Generator Implant Date: 20181126
Lead Channel Impedance Value: 490 Ohm
Lead Channel Impedance Value: 490 Ohm
Lead Channel Pacing Threshold Amplitude: 0.75 V
Lead Channel Pacing Threshold Amplitude: 0.75 V
Lead Channel Pacing Threshold Pulse Width: 0.5 ms
Lead Channel Pacing Threshold Pulse Width: 0.5 ms
Lead Channel Sensing Intrinsic Amplitude: 3.3 mV
Lead Channel Sensing Intrinsic Amplitude: 4.2 mV
Lead Channel Setting Pacing Amplitude: 1 V
Lead Channel Setting Pacing Amplitude: 2 V
Lead Channel Setting Pacing Pulse Width: 0.5 ms
Lead Channel Setting Sensing Sensitivity: 2 mV
Pulse Gen Model: 2272
Pulse Gen Serial Number: 8971004

## 2020-06-30 NOTE — Progress Notes (Signed)
Remote pacemaker transmission.   

## 2020-07-26 DIAGNOSIS — A084 Viral intestinal infection, unspecified: Secondary | ICD-10-CM | POA: Diagnosis not present

## 2020-09-14 DIAGNOSIS — L0390 Cellulitis, unspecified: Secondary | ICD-10-CM | POA: Diagnosis not present

## 2020-09-15 DIAGNOSIS — Z85038 Personal history of other malignant neoplasm of large intestine: Secondary | ICD-10-CM | POA: Diagnosis not present

## 2020-09-15 DIAGNOSIS — Z85528 Personal history of other malignant neoplasm of kidney: Secondary | ICD-10-CM | POA: Diagnosis not present

## 2020-09-15 DIAGNOSIS — Z0000 Encounter for general adult medical examination without abnormal findings: Secondary | ICD-10-CM | POA: Diagnosis not present

## 2020-09-15 DIAGNOSIS — I10 Essential (primary) hypertension: Secondary | ICD-10-CM | POA: Diagnosis not present

## 2020-09-15 DIAGNOSIS — E039 Hypothyroidism, unspecified: Secondary | ICD-10-CM | POA: Diagnosis not present

## 2020-09-15 DIAGNOSIS — Z1389 Encounter for screening for other disorder: Secondary | ICD-10-CM | POA: Diagnosis not present

## 2020-09-15 DIAGNOSIS — Z950 Presence of cardiac pacemaker: Secondary | ICD-10-CM | POA: Diagnosis not present

## 2020-09-25 ENCOUNTER — Ambulatory Visit (INDEPENDENT_AMBULATORY_CARE_PROVIDER_SITE_OTHER): Payer: Medicare PPO

## 2020-09-25 DIAGNOSIS — R55 Syncope and collapse: Secondary | ICD-10-CM

## 2020-09-25 LAB — CUP PACEART REMOTE DEVICE CHECK
Battery Remaining Longevity: 116 mo
Battery Remaining Percentage: 95.5 %
Battery Voltage: 2.99 V
Brady Statistic AP VP Percent: 82 %
Brady Statistic AP VS Percent: 1 %
Brady Statistic AS VP Percent: 18 %
Brady Statistic AS VS Percent: 1 %
Brady Statistic RA Percent Paced: 81 %
Brady Statistic RV Percent Paced: 99 %
Date Time Interrogation Session: 20220228020014
Implantable Lead Implant Date: 20181126
Implantable Lead Implant Date: 20181126
Implantable Lead Location: 753859
Implantable Lead Location: 753860
Implantable Lead Model: 5076
Implantable Lead Model: 5076
Implantable Pulse Generator Implant Date: 20181126
Lead Channel Impedance Value: 490 Ohm
Lead Channel Impedance Value: 550 Ohm
Lead Channel Pacing Threshold Amplitude: 0.75 V
Lead Channel Pacing Threshold Amplitude: 0.75 V
Lead Channel Pacing Threshold Pulse Width: 0.5 ms
Lead Channel Pacing Threshold Pulse Width: 0.5 ms
Lead Channel Sensing Intrinsic Amplitude: 3.3 mV
Lead Channel Sensing Intrinsic Amplitude: 5 mV
Lead Channel Setting Pacing Amplitude: 1 V
Lead Channel Setting Pacing Amplitude: 2 V
Lead Channel Setting Pacing Pulse Width: 0.5 ms
Lead Channel Setting Sensing Sensitivity: 2 mV
Pulse Gen Model: 2272
Pulse Gen Serial Number: 8971004

## 2020-10-02 NOTE — Progress Notes (Signed)
Remote pacemaker transmission.   

## 2020-12-05 ENCOUNTER — Ambulatory Visit: Payer: Medicare PPO | Admitting: Podiatry

## 2020-12-12 ENCOUNTER — Encounter: Payer: Self-pay | Admitting: Podiatry

## 2020-12-12 ENCOUNTER — Other Ambulatory Visit: Payer: Self-pay

## 2020-12-12 ENCOUNTER — Ambulatory Visit: Payer: Medicare PPO | Admitting: Podiatry

## 2020-12-12 DIAGNOSIS — M79672 Pain in left foot: Secondary | ICD-10-CM | POA: Diagnosis not present

## 2020-12-12 DIAGNOSIS — C801 Malignant (primary) neoplasm, unspecified: Secondary | ICD-10-CM | POA: Diagnosis not present

## 2020-12-12 DIAGNOSIS — G63 Polyneuropathy in diseases classified elsewhere: Secondary | ICD-10-CM | POA: Diagnosis not present

## 2020-12-12 DIAGNOSIS — M21612 Bunion of left foot: Secondary | ICD-10-CM | POA: Diagnosis not present

## 2020-12-12 DIAGNOSIS — Z950 Presence of cardiac pacemaker: Secondary | ICD-10-CM | POA: Insufficient documentation

## 2020-12-12 DIAGNOSIS — E039 Hypothyroidism, unspecified: Secondary | ICD-10-CM | POA: Insufficient documentation

## 2020-12-12 DIAGNOSIS — Z85038 Personal history of other malignant neoplasm of large intestine: Secondary | ICD-10-CM | POA: Insufficient documentation

## 2020-12-12 DIAGNOSIS — N529 Male erectile dysfunction, unspecified: Secondary | ICD-10-CM | POA: Insufficient documentation

## 2020-12-12 DIAGNOSIS — L84 Corns and callosities: Secondary | ICD-10-CM

## 2020-12-12 DIAGNOSIS — Z85528 Personal history of other malignant neoplasm of kidney: Secondary | ICD-10-CM | POA: Insufficient documentation

## 2020-12-12 MED ORDER — PREGABALIN 50 MG PO CAPS: 50.0000 mg | ORAL_CAPSULE | Freq: Two times a day (BID) | ORAL | 2 refills | Status: DC

## 2020-12-12 NOTE — Patient Instructions (Signed)
Pregabalin capsules What is this medicine? PREGABALIN (pre GAB a lin) is used to treat nerve pain from diabetes, shingles, spinal cord injury, and fibromyalgia. It is also used to control seizures in epilepsy. This medicine may be used for other purposes; ask your health care provider or pharmacist if you have questions. COMMON BRAND NAME(S): Lyrica What should I tell my health care provider before I take this medicine? They need to know if you have any of these conditions:  drug abuse or addiction  heart failure  kidney disease  lung disease  suicidal thoughts, plans or attempt  an unusual or allergic reaction to pregabalin, other medicines, foods, dyes, or preservatives  pregnant or trying to get pregnant  breast-feeding How should I use this medicine? Take this medicine by mouth with water. Take it as directed on the prescription label at the same time every day. You can take it with or without food. If it upsets your stomach, take it with food. Keep taking it unless your health care provider tells you to stop. A special MedGuide will be given to you by the pharmacist with each prescription and refill. Be sure to read this information carefully each time. Talk to your health care provider about the use of this medicine in children. While it may be prescribed for children as young as 1 month for selected conditions, precautions do apply. Overdosage: If you think you have taken too much of this medicine contact a poison control center or emergency room at once. NOTE: This medicine is only for you. Do not share this medicine with others. What if I miss a dose? If you miss a dose, take it as soon as you can. If it is almost time for your next dose, take only that dose. Do not take double or extra doses. What may interact with this medicine?  alcohol  antihistamines for allergy, cough, and cold  certain medicines for anxiety or sleep  certain medicines for blood pressure, heart  disease  certain medicines for depression like amitriptyline, fluoxetine, sertraline  certain medicines for diabetes, like pioglitazone, rosiglitazone  certain medicines for seizures like phenobarbital, primidone  general anesthetics like halothane, isoflurane, methoxyflurane, propofol  medicines that relax muscles for surgery  narcotic medicines for pain  phenothiazines like chlorpromazine, mesoridazine, prochlorperazine, thioridazine This list may not describe all possible interactions. Give your health care provider a list of all the medicines, herbs, non-prescription drugs, or dietary supplements you use. Also tell them if you smoke, drink alcohol, or use illegal drugs. Some items may interact with your medicine. What should I watch for while using this medicine? Visit your health care provider for regular checks on your progress. Tell your health care provider if your symptoms do not start to get better or if they get worse. Do not suddenly stop taking this medicine. You may develop a severe reaction. Your health care provider will tell you how much medicine to take. If your health care provider wants you to stop the medicine, the dose may be slowly lowered over time to avoid any side effects. You may get drowsy or dizzy. Do not drive, use machinery, or do anything that needs mental alertness until you know how this medicine affects you. Do not stand up or sit up quickly, especially if you are an older patient. This reduces the risk of dizzy or fainting spells. Alcohol may interfere with the effect of this medicine. Avoid alcoholic drinks. If you or your family notice any changes in your   behavior, such as new or worsening depression, thoughts of harming yourself, anxiety, other unusual or disturbing thoughts, or memory loss, call your health care provider right away. Wear a medical ID bracelet or chain if you are taking this medicine for seizures. Carry a card that describes your condition.  List the medicines and doses you take on the card. This medicine may make it more difficult to father a child. Talk to your health care provider if you are concerned about your fertility. What side effects may I notice from receiving this medicine? Side effects that you should report to your doctor or health care provider as soon as possible:  allergic reactions (skin rash, itching or hives; swelling of the face, lips, or tongue)  changes in vision  edema (sudden weight gain; swelling of the ankles, feet, hands or other unusual swelling; trouble breathing)  muscle injury (dark urine; trouble passing urine or change in the amount of urine; unusually weak or tired; muscle pain; back pain)  suicidal thoughts, mood changes  trouble breathing  unusual bruising or bleeding Side effects that usually do not require medical attention (report these to your doctor or health care provider if they continue or are bothersome):  dizziness  dry mouth  tiredness  weight gain This list may not describe all possible side effects. Call your doctor for medical advice about side effects. You may report side effects to FDA at 1-800-FDA-1088. Where should I keep my medicine? Keep out of the reach of children and pets. This medicine can be abused. Keep it in a safe place to protect it from theft. Do not share it with anyone. It is only for you. Selling or giving away this medicine is dangerous and against the law. Store at 25 degrees C (77 degrees F). Get rid of any unused medicine after the expiration date. This medicine may cause harm and death if it is taken by other adults, children, or pets. It is important to get rid of the medicine as soon as you no longer need it or it is expired. You can do this in two ways:  Take the medicine to a medicine take-back program. Check with your pharmacy or law enforcement to find a location.  If you cannot return the medicine, check the label or package insert to see  if the medicine should be thrown out in the garbage or flushed down the toilet. If you are not sure, ask your health care provider. If it is safe to put it in the trash, take the medicine out of the container. Mix the medicine with cat litter, dirt, coffee grounds, or other unwanted substance. Seal the mixture in a bag or container. Put it in the trash. NOTE: This sheet is a summary. It may not cover all possible information. If you have questions about this medicine, talk to your doctor, pharmacist, or health care provider.  2021 Elsevier/Gold Standard (2019-10-29 22:46:17)  

## 2020-12-17 NOTE — Progress Notes (Signed)
Subjective:   Patient ID: Jorge Ross, male   DOB: 80 y.o.   MRN: [id number]   HPI 80 year old male presents the office today for concerns of a skin lesion which is causing tenderness to the ball of his left foot.  Point distal metatarsal tumor gets majority of the discomfort.  This is been getting worse over last 3 to 4 months.  He states it hurts with pressure in shoes.  Denies any swelling or redness or any drainage.  He has a history of chemotherapy for colon cancer and since then he has had neuropathy symptoms and this was in 2015.  He said no treatment for this other than he tried over-the-counter Voltaren gel which does help.  Describes tingling, numbness to his feet.   Review of Systems  All other systems reviewed and are negative.  Past Medical History:  Diagnosis Date  . Arthritis    HIPS  . Chronic kidney disease   . Colon cancer ([place]) 2015  . Hypertension   . Myocardial infarction ([place])   . 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 Unspecified disorder of intestine     Past Surgical History:  Procedure Laterality Date  . CERVICAL SPINE SURGERY     3 titamum screws  . COLECTOMY  03-2014  . COLON SURGERY  04/01/14  . COLONOSCOPY  2007   normal   . CYSTOSCOPY     03-15-16  . HERNIA REPAIR Bilateral    x 3 total   . PACEMAKER IMPLANT N/A 06/23/2017   Procedure: PACEMAKER IMPLANT;  Surgeon: Deboraha Sprang, MD;  Location: [facility];  Service: Cardiovascular;  Laterality: N/A;  . PORT-A-CATH REMOVAL N/A 02/21/2016   Procedure: REMOVAL PORT-A-CATH;  Surgeon: Stark Klein, MD;  Location: [facility];  Service: General;  Laterality: N/A;  . PORTACATH PLACEMENT N/A 05/24/2014   Procedure: INSERTION PORT-A-CATH;  Surgeon: Stark Klein, MD;  Location: [facility];  Service: General;  Laterality: N/A;  . ROBOT ASSISTED LAPAROSCOPIC NEPHRECTOMY Left 04/01/2014   Procedure: ROBOTIC ASSISTED LAPAROSCOPIC LEFT PARTIAL NEPHRECTOMY;  Surgeon: Alexis Frock, MD;  Location:  [facility];  Service: Urology;  Laterality: Left;  . ROTATOR CUFF REPAIR Left   . thumb ligament surgery Right      Current Outpatient Medications:  .  pregabalin (LYRICA) 50 MG capsule, Take 1 capsule (50 mg total) by mouth 2 (two) times daily., Disp: 60 capsule, Rfl: 2 .  aspirin 81 MG tablet, Take 81 mg by mouth daily.  , Disp: , Rfl:  .  irbesartan-hydrochlorothiazide (AVALIDE) 150-12.5 MG per tablet, Take 1 tablet by mouth every morning. , Disp: , Rfl:  .  Multiple Vitamin (MULTIVITAMIN) capsule, Take 1 capsule by mouth daily.  , Disp: , Rfl:  .  naproxen sodium (ANAPROX) 220 MG tablet, Take 220 mg by mouth 2 (two) times daily with a meal., Disp: , Rfl:  .  vitamin E 400 UNIT capsule, Take 400 Units by mouth daily. , Disp: , Rfl:   Allergies  Allergen Reactions  . Codeine Other (See Comments)    constipation          Objective:  Physical Exam  General: AAO x3, NAD  Dermatological: Thick hyperkeratotic lesion submetatarsal 2 on the left foot.  There is no underlying ulceration drainage or any signs of infection.  No open lesions or signs of foreign body.  Vascular: Dorsalis Pedis artery and Posterior Tibial artery pedal pulses are 2/4 bilateral with immedate capillary  fill time. There is no pain with calf compression, swelling, warmth, erythema.   Neruologic: Sensation decreased mostly to the digits with Semmes-Weinstein monofilament.  Musculoskeletal: Bunion is present.  Tenderness to the hyperkeratotic lesion submetatarsal 2.  Muscular strength 5/5 in all groups tested bilateral.  Gait: Unassisted, Nonantalgic.       Assessment:   80 year old male with symptomatic hyperkeratotic lesion left foot, bunion; neuropathy from chemotherapy     Plan:  -Treatment options discussed including all alternatives, risks, and complications -Etiology of symptoms were discussed -Sharply debrided the hyperkeratotic lesion without any complications or bleeding.  Discussed offloading,  shoe modifications and possibly an orthotic.  He was also wanted to discuss bunion surgery surgery which we did as well.  Discussed surgical correction of bunion as well as second tarsal osteotomy. -Regards to neuropathy we will try Lyrica.  Discussed side effects, success rates.  *If symptoms continue x-ray next appointment  Trula Slade DPM

## 2020-12-25 LAB — CUP PACEART REMOTE DEVICE CHECK
Battery Remaining Longevity: 116 mo
Battery Remaining Percentage: 95.5 %
Battery Voltage: 2.99 V
Brady Statistic AP VP Percent: 84 %
Brady Statistic AP VS Percent: 1 %
Brady Statistic AS VP Percent: 16 %
Brady Statistic AS VS Percent: 1 %
Brady Statistic RA Percent Paced: 83 %
Brady Statistic RV Percent Paced: 99 %
Date Time Interrogation Session: 20220530020013
Implantable Lead Implant Date: 20181126
Implantable Lead Implant Date: 20181126
Implantable Lead Location: 753859
Implantable Lead Location: 753860
Implantable Lead Model: 5076
Implantable Lead Model: 5076
Implantable Pulse Generator Implant Date: 20181126
Lead Channel Impedance Value: 490 Ohm
Lead Channel Impedance Value: 540 Ohm
Lead Channel Pacing Threshold Amplitude: 0.75 V
Lead Channel Pacing Threshold Amplitude: 0.75 V
Lead Channel Pacing Threshold Pulse Width: 0.5 ms
Lead Channel Pacing Threshold Pulse Width: 0.5 ms
Lead Channel Sensing Intrinsic Amplitude: 3.3 mV
Lead Channel Sensing Intrinsic Amplitude: 4.7 mV
Lead Channel Setting Pacing Amplitude: 1 V
Lead Channel Setting Pacing Amplitude: 2 V
Lead Channel Setting Pacing Pulse Width: 0.5 ms
Lead Channel Setting Sensing Sensitivity: 2 mV
Pulse Gen Model: 2272
Pulse Gen Serial Number: 8971004

## 2020-12-26 ENCOUNTER — Ambulatory Visit (INDEPENDENT_AMBULATORY_CARE_PROVIDER_SITE_OTHER): Payer: Medicare PPO

## 2020-12-26 DIAGNOSIS — Z950 Presence of cardiac pacemaker: Secondary | ICD-10-CM | POA: Diagnosis not present

## 2021-01-16 NOTE — Progress Notes (Signed)
Remote pacemaker transmission.   

## 2021-02-15 DIAGNOSIS — M25552 Pain in left hip: Secondary | ICD-10-CM | POA: Diagnosis not present

## 2021-02-15 DIAGNOSIS — M545 Low back pain, unspecified: Secondary | ICD-10-CM | POA: Diagnosis not present

## 2021-03-13 ENCOUNTER — Ambulatory Visit: Payer: Medicare PPO | Admitting: Podiatry

## 2021-03-13 ENCOUNTER — Other Ambulatory Visit: Payer: Self-pay

## 2021-03-13 DIAGNOSIS — B351 Tinea unguium: Secondary | ICD-10-CM

## 2021-03-13 DIAGNOSIS — L84 Corns and callosities: Secondary | ICD-10-CM

## 2021-03-13 DIAGNOSIS — G63 Polyneuropathy in diseases classified elsewhere: Secondary | ICD-10-CM | POA: Diagnosis not present

## 2021-03-13 DIAGNOSIS — M79675 Pain in left toe(s): Secondary | ICD-10-CM | POA: Diagnosis not present

## 2021-03-13 DIAGNOSIS — M79674 Pain in right toe(s): Secondary | ICD-10-CM

## 2021-03-13 DIAGNOSIS — S80812A Abrasion, left lower leg, initial encounter: Secondary | ICD-10-CM

## 2021-03-13 DIAGNOSIS — C801 Malignant (primary) neoplasm, unspecified: Secondary | ICD-10-CM | POA: Diagnosis not present

## 2021-03-13 NOTE — Patient Instructions (Signed)
Pregabalin Capsules What is this medication? PREGABALIN (pre GAB a lin) treats nerve pain. It may also be used to prevent and control seizures in people with epilepsy. It works by Production assistant, radio in Veterinary surgeon. This medicine may be used for other purposes; ask your health care provider orpharmacist if you have questions. COMMON BRAND NAME(S): Lyrica What should I tell my care team before I take this medication? They need to know if you have any of these conditions: Drug abuse or addiction Heart failure Kidney disease Lung disease Suicidal thoughts, plans or attempt An unusual or allergic reaction to pregabalin, other medications, foods, dyes, or preservatives Pregnant or trying to get pregnant Breast-feeding How should I use this medication? Take this medication by mouth with water. Take it as directed on the prescription label at the same time every day. You can take it with or without food. If it upsets your stomach, take it with food. Keep taking it unless yourcare team tells you to stop. A special MedGuide will be given to you by the pharmacist with eachprescription and refill. Be sure to read this information carefully each time. Talk to your care team about the use of this medication in children. While it may be prescribed for children as young as 1 month for selected conditions,precautions do apply. Overdosage: If you think you have taken too much of this medicine contact apoison control center or emergency room at once. NOTE: This medicine is only for you. Do not share this medicine with others. What if I miss a dose? If you miss a dose, take it as soon as you can. If it is almost time for yournext dose, take only that dose. Do not take double or extra doses. What may interact with this medication? Alcohol Antihistamines for allergy, cough, and cold Certain medications for anxiety or sleep Certain medications for blood pressure, heart disease Certain medications for  depression like amitriptyline, fluoxetine, sertraline Certain medications for diabetes, like pioglitazone, rosiglitazone Certain medications for seizures like phenobarbital, primidone General anesthetics like halothane, isoflurane, methoxyflurane, propofol Medications that relax muscles for surgery Narcotic medications for pain Phenothiazines like chlorpromazine, mesoridazine, prochlorperazine, thioridazine This list may not describe all possible interactions. Give your health care provider a list of all the medicines, herbs, non-prescription drugs, or dietary supplements you use. Also tell them if you smoke, drink alcohol, or use illegaldrugs. Some items may interact with your medicine. What should I watch for while using this medication? Visit your care team for regular checks on your progress. Tell your care teamif your symptoms do not start to get better or if they get worse. Do not suddenly stop taking this medication. You may develop a severe reaction. Your care team will tell you how much medication to take. If your care team wants you to stop the medication, the dose may be slowly lowered over time toavoid any side effects. You may get drowsy or dizzy. Do not drive, use machinery, or do anything that needs mental alertness until you know how this medication affects you. Do not stand up or sit up quickly, especially if you are an older patient. This reduces the risk of dizzy or fainting spells. Alcohol may interfere with theeffect of this medication. Avoid alcoholic drinks. If you or your family notice any changes in your behavior, such as new or worsening depression, thoughts of harming yourself, anxiety, other unusual ordisturbing thoughts, or memory loss, call your care team right away. Wear a medical ID bracelet or chain if you  are taking this medication for seizures. Carry a card that describes your condition. List the medications anddoses you take on the card. This medication may make it  more difficult to father a child. Talk to your careteam if you are concerned about your fertility. What side effects may I notice from receiving this medication? Side effects that you should report to your care team as soon as possible: Allergic reactions or angioedema-skin rash, itching, hives, swelling of the face, eyes, lips, tongue, arms, or legs, trouble swallowing or breathing Blurry vision Thoughts of suicide or self-harm, worsening mood, feelings of depression Trouble breathing Side effects that usually do not require medical attention (report to your careteam if they continue or are bothersome): Dizziness Drowsiness Dry mouth Nausea Swelling of the ankles, feet, hands Vomiting Weight gain This list may not describe all possible side effects. Call your doctor for medical advice about side effects. You may report side effects to FDA at1-800-FDA-1088. Where should I keep my medication? Keep out of the reach of children and pets. This medication can be abused. Keep it in a safe place to protect it from theft. Do not share it with anyone. It is only for you. Selling or giving away this medication is dangerous and againstthe law. Store at [employer] C (77 degrees F). Get rid of any unused medication afterthe expiration date. This medication may cause harm and death if it is taken by other adults, children, or pets. It is important to get rid of the medication as soon as youno longer need it, or it is expired. You can do this in two ways: Take the medication to a medication take-back program. Check with your pharmacy or law enforcement to find a location. If you cannot return the medication, check the label or package insert to see if the medication should be thrown out in the garbage or flushed down the toilet. If you are not sure, ask your care team. If it is safe to put it in the trash, take the medication out of the container. Mix the medication with cat litter, dirt, coffee grounds, or  other unwanted substance. Seal the mixture in a bag or container. Put it in the trash. NOTE: This sheet is a summary. It may not cover all possible information. If you have questions about this medicine, talk to your doctor, pharmacist, orhealth care provider.  2022 Elsevier/Gold Standard (2020-07-19 22:38:58)

## 2021-03-19 DIAGNOSIS — M25512 Pain in left shoulder: Secondary | ICD-10-CM | POA: Diagnosis not present

## 2021-03-20 NOTE — Progress Notes (Signed)
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  Is been taking Lyrica 1 pill a day which is been helpful but he states he has not started taking it twice a day as prescribed.  He want to start slow with it make sure he does not have any side effects.  Also also for the callus to be termed as well as her nails as they are causing discomfort. Denies any systemic complaints such as fevers, chills, nausea, vomiting. No acute changes since last appointment, and no other complaints at this time.   Objective: AAO x3, NAD DP/PT pulses palpable bilaterally, CRT less than 3 seconds Sensation decreased with Semmes Weinstein monofilament. Nails are hypertrophic, dystrophic, brittle, discolored, elongated 10. No surrounding redness or drainage. Tenderness nails 1-5 bilaterally.  Hyperkeratotic lesion submetatarsal 2 left foot without any underlying ulceration drainage or any signs of infection noted today. Superficial abrasion left leg without any surrounding edema, erythema or signs of infection. Bunion present. No open lesions or pre-ulcerative lesions.  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  -He is to increase the Lyrica to twice a day.  Monitor for any side effects.  It seems to be helping so far. -Sharply debrided the nails x2 without any complications or bleeding -Sharply debrided hyperkeratotic lesion x1 without any complications or bleeding -Monitor the abrasion on the left leg.  Keep a small amount of antibiotic ointment and a bandage daily.  Monitor for any signs or symptoms of infection. -Patient encouraged to call the office with any questions, concerns, change in symptoms.    Trula Slade DPM

## 2021-03-23 DIAGNOSIS — M25552 Pain in left hip: Secondary | ICD-10-CM | POA: Diagnosis not present

## 2021-03-26 ENCOUNTER — Ambulatory Visit (INDEPENDENT_AMBULATORY_CARE_PROVIDER_SITE_OTHER): Payer: Medicare PPO

## 2021-03-26 DIAGNOSIS — I495 Sick sinus syndrome: Secondary | ICD-10-CM | POA: Diagnosis not present

## 2021-03-26 LAB — CUP PACEART REMOTE DEVICE CHECK
Battery Remaining Longevity: 70 mo
Battery Remaining Percentage: 62 %
Battery Voltage: 2.98 V
Brady Statistic AP VP Percent: 84 %
Brady Statistic AP VS Percent: 1 %
Brady Statistic AS VP Percent: 16 %
Brady Statistic AS VS Percent: 1 %
Brady Statistic RA Percent Paced: 83 %
Brady Statistic RV Percent Paced: 99 %
Date Time Interrogation Session: 20220829020012
Implantable Lead Implant Date: 20181126
Implantable Lead Implant Date: 20181126
Implantable Lead Location: 753859
Implantable Lead Location: 753860
Implantable Lead Model: 5076
Implantable Lead Model: 5076
Implantable Pulse Generator Implant Date: 20181126
Lead Channel Impedance Value: 460 Ohm
Lead Channel Impedance Value: 540 Ohm
Lead Channel Pacing Threshold Amplitude: 0.625 V
Lead Channel Pacing Threshold Amplitude: 0.75 V
Lead Channel Pacing Threshold Pulse Width: 0.5 ms
Lead Channel Pacing Threshold Pulse Width: 0.5 ms
Lead Channel Sensing Intrinsic Amplitude: 3.3 mV
Lead Channel Sensing Intrinsic Amplitude: 5 mV
Lead Channel Setting Pacing Amplitude: 0.875
Lead Channel Setting Pacing Amplitude: 2 V
Lead Channel Setting Pacing Pulse Width: 0.5 ms
Lead Channel Setting Sensing Sensitivity: 2 mV
Pulse Gen Model: 2272
Pulse Gen Serial Number: 8971004

## 2021-04-05 DIAGNOSIS — M25552 Pain in left hip: Secondary | ICD-10-CM | POA: Diagnosis not present

## 2021-04-05 NOTE — Progress Notes (Signed)
Remote pacemaker transmission.   

## 2021-04-10 ENCOUNTER — Telehealth: Payer: Self-pay | Admitting: Internal Medicine

## 2021-04-10 NOTE — Telephone Encounter (Signed)
Call sent straight to triage. Patient has been having chest pain that comes and goes since he fell two days ago. Patient stated it usually comes on when he is moving around. Patient also has a device and is trying to send a transmission, but he is having trouble. Made patient an appointment with DOD at [facility] tomorrow to get evaluated for his chest pain. Patient advised to go to ED if chest pain symptoms get worse and not relieved with rest. Will send message to device clinic to help patient.

## 2021-04-10 NOTE — Telephone Encounter (Signed)
Pt c/o of Chest Pain: STAT if CP now or developed within 24 hours  1. Are you having CP right now? no  2. Are you experiencing any other symptoms (ex. SOB, nausea, vomiting, sweating)? Dizzy, eyes feel weak  3. How long have you been experiencing CP? A couple days  4. Is your CP continuous or coming and going? Comes and goes   5. Have you taken Nitroglycerin? No  STAT if patient feels like he/she is going to faint   Are you dizzy now? no  Do you feel faint or have you passed out? no  Do you have any other symptoms? Intermittent chest pain, eyes feel weak   Have you checked your HR and BP (record if available)? no  Patient went to try and send a reading from his pacemaker to the office but he is having a hard time sending it

## 2021-04-10 NOTE — Telephone Encounter (Signed)
Successful telephone encounter to patient to follow up on need for assistance to send remote transmission. Transmission sent and reviewed. AP/VP with pacs and AMS episodes. Patient denies additional complaints other than previously documented in triage encounter.  Patient to follow up in clinic tomorrow with Dr. Sallyanne Kuster.

## 2021-04-11 ENCOUNTER — Other Ambulatory Visit: Payer: Self-pay

## 2021-04-11 ENCOUNTER — Encounter: Payer: Self-pay | Admitting: Cardiovascular Disease

## 2021-04-11 ENCOUNTER — Ambulatory Visit (INDEPENDENT_AMBULATORY_CARE_PROVIDER_SITE_OTHER): Payer: Medicare PPO | Admitting: Cardiovascular Disease

## 2021-04-11 VITALS — BP 126/68 | HR 77 | Ht 71.0 in | Wt 184.8 lb

## 2021-04-11 DIAGNOSIS — R072 Precordial pain: Secondary | ICD-10-CM

## 2021-04-11 DIAGNOSIS — Z950 Presence of cardiac pacemaker: Secondary | ICD-10-CM | POA: Diagnosis not present

## 2021-04-11 DIAGNOSIS — M25512 Pain in left shoulder: Secondary | ICD-10-CM | POA: Diagnosis not present

## 2021-04-11 DIAGNOSIS — I442 Atrioventricular block, complete: Secondary | ICD-10-CM | POA: Diagnosis not present

## 2021-04-11 DIAGNOSIS — I10 Essential (primary) hypertension: Secondary | ICD-10-CM

## 2021-04-11 DIAGNOSIS — R531 Weakness: Secondary | ICD-10-CM | POA: Diagnosis not present

## 2021-04-11 DIAGNOSIS — M25612 Stiffness of left shoulder, not elsewhere classified: Secondary | ICD-10-CM | POA: Diagnosis not present

## 2021-04-11 LAB — PACEMAKER DEVICE OBSERVATION

## 2021-04-11 NOTE — Patient Instructions (Signed)
Medication Instructions:  No changes *If you need a refill on your cardiac medications before your next appointment, please call your pharmacy*   Lab Work: None ordered If you have labs (blood work) drawn today and your tests are completely normal, you will receive your results only by: [place] (if you have MyChart) OR A paper copy in the mail If you have any lab test that is abnormal or we need to change your treatment, we will call you to review the results.   Testing/Procedures: None ordered   Follow-Up: At [facility], you and your health needs are our priority.  As part of our continuing mission to provide you with exceptional heart care, we have created designated Provider Care Teams.  These Care Teams include your primary Cardiologist (physician) and Advanced Practice Providers (APPs -  Physician Assistants and Nurse Practitioners) who all work together to provide you with the care you need, when you need it.  We recommend signing up for the patient portal called "MyChart".  Sign up information is provided on this After Visit Summary.  MyChart is used to connect with patients for Virtual Visits (Telemedicine).  Patients are able to view lab/test results, encounter notes, upcoming appointments, etc.  Non-urgent messages can be sent to your provider as well.   To learn more about what you can do with MyChart, go to [email or web address].    Your next appointment:   3 month(s)  The format for your next appointment:   In Person  Provider:   Dr. Caryl Comes

## 2021-04-11 NOTE — Progress Notes (Signed)
Cardiology Office Note:    Date:  04/11/2021   ID:  KCI BAES, DOB 1940-09-14, MRN [id number]  PCP:  Seward Carol, MD   [facility] Providers Cardiologist:  Virl Axe, MD     Referring MD: Seward Carol, MD   Chief Complaint  Patient presents with   Chest Pain    History of Present Illness:    Jorge Ross is a 80 y.o. 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  He had a fall about 2 to 3 weeks ago onto his left side.  He has a large ecchymosis over his left upper arm, but did not have any broken bones.  A few days ago he developed a sharp sticking pain in his precordial area that would "come and go".  Each individual event only lasted for a second.  It seem to be positional.  Palpation over his anterior chest reproduces the symptoms to some degree, although they are much milder now.  Has occasional dizziness with bending over and getting back up, he does not have random episodes of dizziness and has not experienced syncope.  He denies palpitations.  He does not have shortness of breath with usual activity and does not have orthopnea, PND or lower extremity edema.  He is familiar with the symptoms of congestive heart failure, his wife has this problem.  He denies claudication or focal neurological complaints.  Pacemaker interrogation shows normal device function.  Estimated generator longevity is 5.5-6.0 years.  Lead parameters are normal.  He is pacemaker dependent due to complete heart block.  There is no escape rhythm when pacing is taken down to 30 bpm.  Despite VIP, he has greater than 99% ventricular paced rhythm.  He also paces the atrium 82% of the time and  has decent heart rate histogram distribution.  He has had almost 100,000 episodes of atrial mode switch in the last 12 months, all of them extremely brief 4-14 seconds in duration.  All of the episodes that can be reviewed show that they are artifactual, due to far field R wave oversensing.  Increase the post ventricular atrial blanking period to 190 ms and this led to abolition of the far field R wave oversensing in clinic today.  Also turned VIP off since there is no residual AV conduction.  Past Medical History:  Diagnosis Date   Arthritis    HIPS   Chronic kidney disease    Colon cancer ([place]) 2015   Hypertension    Myocardial infarction [facility])    renal cancer 2015   kidney left   Unspecified disorder of intestine     Past Surgical History:  Procedure Laterality Date   CERVICAL SPINE SURGERY     3 titamum screws   COLECTOMY  03-2014   COLON SURGERY  04/01/14   COLONOSCOPY  2007   normal    CYSTOSCOPY     03-15-16   HERNIA REPAIR Bilateral    x 3 total    PACEMAKER IMPLANT N/A 06/23/2017   Procedure: PACEMAKER IMPLANT;  Surgeon: Deboraha Sprang, MD;  Location: [place] CV [facility];  Service: Cardiovascular;  Laterality: N/A;   PORT-A-CATH REMOVAL N/A 02/21/2016   Procedure: REMOVAL PORT-A-CATH;  Surgeon: Stark Klein, MD;  Location: [place];  Service: General;  Laterality: N/A;   PORTACATH PLACEMENT N/A 05/24/2014   Procedure: INSERTION PORT-A-CATH;  Surgeon: Stark Klein, MD;  Location: [place];  Service: General;  Laterality: N/A;   ROBOT ASSISTED LAPAROSCOPIC NEPHRECTOMY Left 04/01/2014   Procedure: ROBOTIC ASSISTED LAPAROSCOPIC LEFT PARTIAL NEPHRECTOMY;  Surgeon: Alexis Frock, MD;  Location: [facility];  Service: Urology;  Laterality: Left;   ROTATOR CUFF REPAIR Left    thumb ligament surgery Right     Current Medications: Current Meds  Medication Sig   aspirin 81 MG tablet Take 81 mg by mouth daily.     irbesartan-hydrochlorothiazide (AVALIDE) 150-12.5 MG per  tablet Take 1 tablet by mouth every morning.    Multiple Vitamin (MULTIVITAMIN) capsule Take 1 capsule by mouth daily.     naproxen sodium (ANAPROX) 220 MG tablet Take 220 mg by mouth 2 (two) times daily with a meal.   pregabalin (LYRICA) 50 MG capsule Take 1 capsule (50 mg total) by mouth 2 (two) times daily.   vitamin E 400 UNIT capsule Take 400 Units by mouth daily.      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  Tobacco Use   Smoking status: Never   Smokeless tobacco: Never  Vaping Use   Vaping Use: Never used  Substance and Sexual Activity   Alcohol use: Not Currently    Comment: rare   Drug use: No   Sexual activity: Not on file  Other Topics Concern   Not on file  Social History Narrative   Not on file   Social Determinants of Health   Financial Resource Strain: Not on file  Food Insecurity: Not on file  Transportation Needs: Not on file  Physical Activity: Not on file  Stress: Not on file  Social Connections: Not on file     Family History: The patient's family history includes Lung cancer in his paternal uncle; Prostate cancer in his paternal uncle. There is no history of Colon cancer, Rectal cancer, Esophageal cancer, or Stomach cancer.  ROS:   Please see the history of present illness.     All other systems reviewed and are negative.  EKGs/Labs/Other Studies Reviewed:    The following studies were reviewed today: Comprehensive pacemaker interrogation and reprogramming in the clinic today.  EKG:  EKG is  ordered today.  The ekg ordered today demonstrates atrial sensed, ventricular paced rhythm with long AV delay 226 ms.  Recent Labs: No results found for requested labs within last 8760 hours.   Recent Lipid Panel No results found for: CHOL, TRIG, HDL, CHOLHDL, VLDL, LDLCALC, LDLDIRECT   Risk Assessment/Calculations:           Physical Exam:    VS:  BP 126/68 (BP Location: Right Arm, Patient Position: Sitting, Cuff Size: Normal)   Pulse 77   Ht '5\' 11"'$  (1.803 m)   Wt 184 lb 12.8 oz (83.8 kg)   SpO2 98%   BMI 25.77 kg/m     Wt Readings from Last 3 Encounters:  04/11/21 184 lb 12.8 oz (83.8 kg)  05/12/20 195 lb (88.5 kg)  01/15/20 196 lb (88.9 kg)     GEN: Appears healthy,  younger than stated age.  Well nourished, well developed in no acute distress HEENT: Normal NECK: No JVD; No carotid bruits LYMPHATICS: No lymphadenopathy CARDIAC: Paradoxically split second heart sound, otherwise normal exam.  RRR, no murmurs, rubs, gallops the pacemaker site appears healthy without bruising or swelling. RESPIRATORY:  Clear to auscultation without rales, wheezing or rhonchi  ABDOMEN: Soft, non-tender, non-distended MUSCULOSKELETAL:  No edema; No deformity  SKIN: Warm and dry.  He has an extensive ecchymosis over his left upper arm.  No point tenderness and no hematoma NEUROLOGIC:  Alert and oriented x 3 PSYCHIATRIC:  Normal affect   ASSESSMENT:    1. Precordial pain   2. Atrioventricular block, third degree (HCC)   3. Cardiac pacemaker in situ   4. Essential hypertension    PLAN:    In order of problems listed above:  Chest pain: Sounds distinctly musculoskeletal.  May be related to his recent fall.  Unfortunately his ECG is not diagnostic for coronary insufficiency purposes.  I reviewed his CT of the chest from 2020 that shows extremely scanty atherosclerotic plaques in the descending aorta and virtually no visible calcification in the distribution of the coronary arteries.  The likelihood of coronary disease in the gentleman appears to be quite low.  He had a normal echocardiogram in the past.  No further cardiac evaluation indicated unless his symptoms return. Complete  heart block: He is pacemaker dependent.  There is no escape rhythm when pacing is taken down to 30 bpm. Pacemaker: Extended the PVAB to prevent the incessant episodes of mode switch related to far field R wave oversensing.  Turned VIP off since there is no residual AV conduction.  Follow-up with Dr. Caryl Comes. HTN: Well-controlled.  He does have some symptoms of orthostatic hypotension, but these are quite mild.  Continue current medications.        Medication Adjustments/Labs and Tests Ordered: Current medicines are reviewed at length with the patient today.  Concerns regarding medicines are outlined above.  Orders Placed This Encounter  Procedures   EKG 12-Lead   No orders of the defined types were placed in this encounter.   Patient Instructions  Medication Instructions:  No changes *If you need a refill on your cardiac medications before your next appointment, please call your pharmacy*   Lab Work: None ordered If you have labs (blood work) drawn today and your tests are completely normal, you will receive your results only by: [place] (if you have MyChart) OR A paper copy in the mail If you have any lab test that is abnormal or we need to change your treatment, we will call you to review the results.   Testing/Procedures: None ordered   Follow-Up: At [facility], you and your health needs are our priority.  As part of our continuing mission to provide you with exceptional heart care, we have created designated Provider Care Teams.  These Care Teams include your primary Cardiologist (physician) and Advanced Practice Providers (APPs -  Physician Assistants and Nurse Practitioners) who all work together to provide you with the care you need, when you need it.  We recommend signing up for the patient portal called "MyChart".  Sign up information is provided on this After Visit Summary.  MyChart is used to connect with patients for Virtual Visits (Telemedicine).  Patients  are able to view lab/test results, encounter notes, upcoming appointments, etc.  Non-urgent messages can be sent to your provider as well.   To learn more about what you  can do with MyChart, go to [email or web address].    Your next appointment:   3 month(s)  The format for your next appointment:   In Person  Provider:   Dr. Caryl Comes     Signed, Sanda Klein, MD  04/11/2021 10:31 AM    [place]

## 2021-04-16 DIAGNOSIS — M25512 Pain in left shoulder: Secondary | ICD-10-CM | POA: Diagnosis not present

## 2021-04-16 DIAGNOSIS — M25612 Stiffness of left shoulder, not elsewhere classified: Secondary | ICD-10-CM | POA: Diagnosis not present

## 2021-04-16 DIAGNOSIS — R531 Weakness: Secondary | ICD-10-CM | POA: Diagnosis not present

## 2021-04-20 DIAGNOSIS — M25512 Pain in left shoulder: Secondary | ICD-10-CM | POA: Diagnosis not present

## 2021-04-20 DIAGNOSIS — R531 Weakness: Secondary | ICD-10-CM | POA: Diagnosis not present

## 2021-04-20 DIAGNOSIS — M25612 Stiffness of left shoulder, not elsewhere classified: Secondary | ICD-10-CM | POA: Diagnosis not present

## 2021-04-23 DIAGNOSIS — R531 Weakness: Secondary | ICD-10-CM | POA: Diagnosis not present

## 2021-04-23 DIAGNOSIS — M25612 Stiffness of left shoulder, not elsewhere classified: Secondary | ICD-10-CM | POA: Diagnosis not present

## 2021-04-23 DIAGNOSIS — M25512 Pain in left shoulder: Secondary | ICD-10-CM | POA: Diagnosis not present

## 2021-04-25 DIAGNOSIS — M1990 Unspecified osteoarthritis, unspecified site: Secondary | ICD-10-CM | POA: Diagnosis not present

## 2021-04-25 DIAGNOSIS — N189 Chronic kidney disease, unspecified: Secondary | ICD-10-CM | POA: Diagnosis not present

## 2021-04-25 DIAGNOSIS — I252 Old myocardial infarction: Secondary | ICD-10-CM | POA: Diagnosis not present

## 2021-04-25 DIAGNOSIS — I4430 Unspecified atrioventricular block: Secondary | ICD-10-CM | POA: Diagnosis not present

## 2021-04-25 DIAGNOSIS — T451X5S Adverse effect of antineoplastic and immunosuppressive drugs, sequela: Secondary | ICD-10-CM | POA: Diagnosis not present

## 2021-04-25 DIAGNOSIS — G8929 Other chronic pain: Secondary | ICD-10-CM | POA: Diagnosis not present

## 2021-04-25 DIAGNOSIS — I129 Hypertensive chronic kidney disease with stage 1 through stage 4 chronic kidney disease, or unspecified chronic kidney disease: Secondary | ICD-10-CM | POA: Diagnosis not present

## 2021-04-25 DIAGNOSIS — G620 Drug-induced polyneuropathy: Secondary | ICD-10-CM | POA: Diagnosis not present

## 2021-04-25 DIAGNOSIS — I2510 Atherosclerotic heart disease of native coronary artery without angina pectoris: Secondary | ICD-10-CM | POA: Diagnosis not present

## 2021-04-26 DIAGNOSIS — R531 Weakness: Secondary | ICD-10-CM | POA: Diagnosis not present

## 2021-04-26 DIAGNOSIS — M25612 Stiffness of left shoulder, not elsewhere classified: Secondary | ICD-10-CM | POA: Diagnosis not present

## 2021-04-26 DIAGNOSIS — M25512 Pain in left shoulder: Secondary | ICD-10-CM | POA: Diagnosis not present

## 2021-04-30 DIAGNOSIS — M25612 Stiffness of left shoulder, not elsewhere classified: Secondary | ICD-10-CM | POA: Diagnosis not present

## 2021-04-30 DIAGNOSIS — R531 Weakness: Secondary | ICD-10-CM | POA: Diagnosis not present

## 2021-04-30 DIAGNOSIS — M25512 Pain in left shoulder: Secondary | ICD-10-CM | POA: Diagnosis not present

## 2021-05-02 DIAGNOSIS — M25512 Pain in left shoulder: Secondary | ICD-10-CM | POA: Diagnosis not present

## 2021-05-02 DIAGNOSIS — G629 Polyneuropathy, unspecified: Secondary | ICD-10-CM | POA: Diagnosis not present

## 2021-05-02 DIAGNOSIS — M25559 Pain in unspecified hip: Secondary | ICD-10-CM | POA: Diagnosis not present

## 2021-05-03 DIAGNOSIS — M25512 Pain in left shoulder: Secondary | ICD-10-CM | POA: Diagnosis not present

## 2021-05-04 ENCOUNTER — Other Ambulatory Visit: Payer: Self-pay | Admitting: Orthopedic Surgery

## 2021-05-04 DIAGNOSIS — R531 Weakness: Secondary | ICD-10-CM | POA: Diagnosis not present

## 2021-05-04 DIAGNOSIS — M75102 Unspecified rotator cuff tear or rupture of left shoulder, not specified as traumatic: Secondary | ICD-10-CM

## 2021-05-04 DIAGNOSIS — M25512 Pain in left shoulder: Secondary | ICD-10-CM | POA: Diagnosis not present

## 2021-05-04 DIAGNOSIS — M25612 Stiffness of left shoulder, not elsewhere classified: Secondary | ICD-10-CM | POA: Diagnosis not present

## 2021-05-11 DIAGNOSIS — M25612 Stiffness of left shoulder, not elsewhere classified: Secondary | ICD-10-CM | POA: Diagnosis not present

## 2021-05-11 DIAGNOSIS — R531 Weakness: Secondary | ICD-10-CM | POA: Diagnosis not present

## 2021-05-11 DIAGNOSIS — M25512 Pain in left shoulder: Secondary | ICD-10-CM | POA: Diagnosis not present

## 2021-05-16 DIAGNOSIS — R531 Weakness: Secondary | ICD-10-CM | POA: Diagnosis not present

## 2021-05-16 DIAGNOSIS — M25512 Pain in left shoulder: Secondary | ICD-10-CM | POA: Diagnosis not present

## 2021-05-16 DIAGNOSIS — M25612 Stiffness of left shoulder, not elsewhere classified: Secondary | ICD-10-CM | POA: Diagnosis not present

## 2021-05-18 ENCOUNTER — Ambulatory Visit
Admission: RE | Admit: 2021-05-18 | Discharge: 2021-05-18 | Disposition: A | Discharge status: Home / Self Care | Payer: Medicare PPO | Source: Ambulatory Visit | Attending: Orthopedic Surgery | Admitting: Orthopedic Surgery

## 2021-05-18 ENCOUNTER — Other Ambulatory Visit: Payer: Self-pay

## 2021-05-18 DIAGNOSIS — M75102 Unspecified rotator cuff tear or rupture of left shoulder, not specified as traumatic: Secondary | ICD-10-CM

## 2021-05-18 MED ORDER — IOPAMIDOL (ISOVUE-M 200) INJECTION 41%
12.0000 mL | Freq: Once | INTRAMUSCULAR | Status: AC
  Administered 2021-05-18: 12 mL via INTRA_ARTICULAR

## 2021-05-28 DIAGNOSIS — M25512 Pain in left shoulder: Secondary | ICD-10-CM | POA: Diagnosis not present

## 2021-06-01 DIAGNOSIS — S46012A Strain of muscle(s) and tendon(s) of the rotator cuff of left shoulder, initial encounter: Secondary | ICD-10-CM | POA: Diagnosis not present

## 2021-06-13 DIAGNOSIS — M25552 Pain in left hip: Secondary | ICD-10-CM | POA: Diagnosis not present

## 2021-06-14 ENCOUNTER — Ambulatory Visit: Payer: Medicare PPO | Admitting: Podiatry

## 2021-06-14 ENCOUNTER — Other Ambulatory Visit: Payer: Self-pay | Admitting: Orthopedic Surgery

## 2021-06-14 DIAGNOSIS — M25552 Pain in left hip: Secondary | ICD-10-CM | POA: Diagnosis not present

## 2021-06-14 DIAGNOSIS — M545 Low back pain, unspecified: Secondary | ICD-10-CM

## 2021-06-25 ENCOUNTER — Ambulatory Visit (INDEPENDENT_AMBULATORY_CARE_PROVIDER_SITE_OTHER): Payer: Medicare PPO

## 2021-06-25 DIAGNOSIS — I442 Atrioventricular block, complete: Secondary | ICD-10-CM

## 2021-06-25 LAB — CUP PACEART REMOTE DEVICE CHECK
Battery Remaining Longevity: 67 mo
Battery Remaining Percentage: 59 %
Battery Voltage: 2.98 V
Brady Statistic AP VP Percent: 71 %
Brady Statistic AP VS Percent: 1 %
Brady Statistic AS VP Percent: 29 %
Brady Statistic AS VS Percent: 1 %
Brady Statistic RA Percent Paced: 71 %
Brady Statistic RV Percent Paced: 99 %
Date Time Interrogation Session: 20221128020019
Implantable Lead Implant Date: 20181126
Implantable Lead Implant Date: 20181126
Implantable Lead Location: 753859
Implantable Lead Location: 753860
Implantable Lead Model: 5076
Implantable Lead Model: 5076
Implantable Pulse Generator Implant Date: 20181126
Lead Channel Impedance Value: 450 Ohm
Lead Channel Impedance Value: 510 Ohm
Lead Channel Pacing Threshold Amplitude: 0.75 V
Lead Channel Pacing Threshold Amplitude: 0.75 V
Lead Channel Pacing Threshold Pulse Width: 0.5 ms
Lead Channel Pacing Threshold Pulse Width: 0.5 ms
Lead Channel Sensing Intrinsic Amplitude: 3.3 mV
Lead Channel Sensing Intrinsic Amplitude: 4.9 mV
Lead Channel Setting Pacing Amplitude: 1 V
Lead Channel Setting Pacing Amplitude: 2 V
Lead Channel Setting Pacing Pulse Width: 0.5 ms
Lead Channel Setting Sensing Sensitivity: 2 mV
Pulse Gen Model: 2272
Pulse Gen Serial Number: 8971004

## 2021-07-02 NOTE — Progress Notes (Signed)
Remote pacemaker transmission.   

## 2021-07-03 ENCOUNTER — Ambulatory Visit: Payer: Medicare PPO | Admitting: Podiatry

## 2021-07-03 ENCOUNTER — Other Ambulatory Visit: Payer: Self-pay

## 2021-07-03 DIAGNOSIS — M79675 Pain in left toe(s): Secondary | ICD-10-CM | POA: Diagnosis not present

## 2021-07-03 DIAGNOSIS — B351 Tinea unguium: Secondary | ICD-10-CM | POA: Diagnosis not present

## 2021-07-03 DIAGNOSIS — G63 Polyneuropathy in diseases classified elsewhere: Secondary | ICD-10-CM

## 2021-07-03 DIAGNOSIS — C801 Malignant (primary) neoplasm, unspecified: Secondary | ICD-10-CM

## 2021-07-03 DIAGNOSIS — M79674 Pain in right toe(s): Secondary | ICD-10-CM

## 2021-07-03 DIAGNOSIS — L97521 Non-pressure chronic ulcer of other part of left foot limited to breakdown of skin: Secondary | ICD-10-CM

## 2021-07-04 ENCOUNTER — Ambulatory Visit
Admission: RE | Admit: 2021-07-04 | Discharge: 2021-07-04 | Disposition: A | Discharge status: Home / Self Care | Payer: Medicare PPO | Source: Ambulatory Visit | Attending: Orthopedic Surgery | Admitting: Orthopedic Surgery

## 2021-07-04 DIAGNOSIS — M2578 Osteophyte, vertebrae: Secondary | ICD-10-CM | POA: Diagnosis not present

## 2021-07-04 DIAGNOSIS — M4126 Other idiopathic scoliosis, lumbar region: Secondary | ICD-10-CM | POA: Diagnosis not present

## 2021-07-04 DIAGNOSIS — M48061 Spinal stenosis, lumbar region without neurogenic claudication: Secondary | ICD-10-CM | POA: Diagnosis not present

## 2021-07-04 DIAGNOSIS — M545 Low back pain, unspecified: Secondary | ICD-10-CM

## 2021-07-06 NOTE — Progress Notes (Signed)
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  Patient is still having the numbness.  Taking Lyrica 1 tablet a day which does seem to help except for the numbness.  Try to increase it to 2 tablets a day after cough side effects.  Decrease it back to the once a day which she is tolerating well.  Asking the nails to be trimmed today as are causing discomfort and thickened elongated.  Denies any open lesions.    Objective: AAO x3, NAD DP/PT pulses palpable bilaterally, CRT less than 3 seconds Sensation decreased with Semmes Weinstein monofilament. Nails are hypertrophic, dystrophic and elongated with yellow-brown discoloration with subungual debris.  Tenderness nails 1-5 bilaterally. At the distal aspect of the left second digit is what appears to be old dried blisters.  Upon debridement there is a superficial area of skin breakdown noted on the medial aspect of second digit.  This is likely coming from compression due to the bunion putting pressure on the second digit.  There is no probing, undermining or tunneling.  There is no surrounding erythema, ascending cellulitis.  No fluctuation crepitation there is no malodor. 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  -Continue your daily.  Continue monitoring side effects.  Discussed with him difficult to treat the numbness and likely ideally this will improve given the timeframe is been ongoing.  Lyrica seems to be controlling other symptoms of neuropathy. -Sharply debrided the nails x10 without any complications or bleeding -Sharply debrided the old dried blisters to the distal  second toe which revealed superficial skin breakdown.  Recommended small amount of antibiotic ointment dressing changes daily.  Monitor for any worsening or any signs or symptoms of infection.  Offloading pads dispensed.  Return in about 3 weeks (around 07/24/2021) for toe ulcer left 2nd .  Trula Slade DPM

## 2021-07-09 DIAGNOSIS — M25552 Pain in left hip: Secondary | ICD-10-CM | POA: Diagnosis not present

## 2021-07-10 DIAGNOSIS — I442 Atrioventricular block, complete: Secondary | ICD-10-CM | POA: Insufficient documentation

## 2021-07-12 ENCOUNTER — Other Ambulatory Visit: Payer: Self-pay

## 2021-07-12 ENCOUNTER — Ambulatory Visit: Payer: Medicare PPO | Admitting: Internal Medicine

## 2021-07-12 VITALS — BP 112/60 | HR 62 | Ht 71.0 in | Wt 197.0 lb

## 2021-07-12 DIAGNOSIS — I10 Essential (primary) hypertension: Secondary | ICD-10-CM

## 2021-07-12 DIAGNOSIS — Z950 Presence of cardiac pacemaker: Secondary | ICD-10-CM

## 2021-07-12 DIAGNOSIS — I442 Atrioventricular block, complete: Secondary | ICD-10-CM

## 2021-07-12 LAB — CUP PACEART INCLINIC DEVICE CHECK
Battery Remaining Longevity: 64 mo
Battery Voltage: 2.98 V
Brady Statistic RA Percent Paced: 72 %
Brady Statistic RV Percent Paced: 99.99 %
Date Time Interrogation Session: 20221215140127
Implantable Lead Implant Date: 20181126
Implantable Lead Implant Date: 20181126
Implantable Lead Location: 753859
Implantable Lead Location: 753860
Implantable Lead Model: 5076
Implantable Lead Model: 5076
Implantable Pulse Generator Implant Date: 20181126
Lead Channel Impedance Value: 475 Ohm
Lead Channel Impedance Value: 537.5 Ohm
Lead Channel Pacing Threshold Amplitude: 0.5 V
Lead Channel Pacing Threshold Amplitude: 0.5 V
Lead Channel Pacing Threshold Amplitude: 0.75 V
Lead Channel Pacing Threshold Amplitude: 0.75 V
Lead Channel Pacing Threshold Pulse Width: 0.5 ms
Lead Channel Pacing Threshold Pulse Width: 0.5 ms
Lead Channel Pacing Threshold Pulse Width: 0.5 ms
Lead Channel Pacing Threshold Pulse Width: 0.5 ms
Lead Channel Sensing Intrinsic Amplitude: 3.3 mV
Lead Channel Sensing Intrinsic Amplitude: 4.7 mV
Lead Channel Setting Pacing Amplitude: 1 V
Lead Channel Setting Pacing Amplitude: 2 V
Lead Channel Setting Pacing Pulse Width: 0.5 ms
Lead Channel Setting Sensing Sensitivity: 2 mV
Pulse Gen Model: 2272
Pulse Gen Serial Number: 8971004

## 2021-07-12 NOTE — Progress Notes (Signed)
Patient Care Team: Seward Carol, MD as PCP - General (Internal Medicine) Deboraha Sprang, MD as PCP - Cardiology (Cardiology) Stark Arpita Fentress, MD as Referring Physician (General Surgery) Wyatt Portela, MD as Consulting Physician (Oncology)   HPI  Jorge Ross is a 80 y.o. male Seen in follow-up for pacemaker implanted ( [employer] DOI 11/18) for sinus node dysfunction and syncope in the context of left bundle branch block.  The patient denies chest pain, shortness of breath, nocturnal dyspnea, orthopnea or peripheral edema.  There have been no palpitations  or syncope.  Complains of dizziness as well as lightheadedness with standing.  Also back and hip pain .  As well as an interval diagnosis of neuropathy which may also be affecting balance    Continues to care for his wife, has earned [employer] class 2 certification    Date Cr K Hgb  6/21 1.14 3.7 14.7           Records and Results Reviewed  Past Medical History:  Diagnosis Date   Arthritis    HIPS   Chronic kidney disease    Colon cancer ([place]) 2015   Hypertension    Myocardial infarction [facility])    renal cancer 2015   kidney left   Unspecified disorder of intestine     Past Surgical History:  Procedure Laterality Date   CERVICAL SPINE SURGERY     3 titamum screws   COLECTOMY  03-2014   COLON SURGERY  04/01/14   COLONOSCOPY  2007   normal    CYSTOSCOPY     03-15-16   HERNIA REPAIR Bilateral    x 3 total    PACEMAKER IMPLANT N/A 06/23/2017   Procedure: PACEMAKER IMPLANT;  Surgeon: Deboraha Sprang, MD;  Location: [place] CV LAB;  Service: Cardiovascular;  Laterality: N/A;   PORT-A-CATH REMOVAL N/A 02/21/2016   Procedure: REMOVAL PORT-A-CATH;  Surgeon: Stark Frantz Quattrone, MD;  Location: [place];  Service: General;  Laterality: N/A;   PORTACATH PLACEMENT N/A 05/24/2014   Procedure: INSERTION PORT-A-CATH;  Surgeon: Stark Falynn Ailey, MD;  Location: [place];  Service: General;  Laterality: N/A;    ROBOT ASSISTED LAPAROSCOPIC NEPHRECTOMY Left 04/01/2014   Procedure: ROBOTIC ASSISTED LAPAROSCOPIC LEFT PARTIAL NEPHRECTOMY;  Surgeon: Alexis Frock, MD;  Location: [facility];  Service: Urology;  Laterality: Left;   ROTATOR CUFF REPAIR Left    thumb ligament surgery Right     Current Outpatient Medications  Medication Sig Dispense Refill   aspirin 81 MG tablet Take 81 mg by mouth daily.       irbesartan-hydrochlorothiazide (AVALIDE) 150-12.5 MG per tablet Take 1 tablet by mouth every morning.      Multiple Vitamin (MULTIVITAMIN) capsule Take 1 capsule by mouth daily.       naproxen sodium (ANAPROX) 220 MG tablet Take 220 mg by mouth 2 (two) times daily with a meal.     pregabalin (LYRICA) 50 MG capsule Take 1 capsule (50 mg total) by mouth 2 (two) times daily. 60 capsule 2   traMADol (ULTRAM) 50 MG tablet Take 50 mg by mouth every 4 (four) hours as needed for pain.     vitamin E 400 UNIT capsule Take 400 Units by mouth daily.      No current facility-administered medications for this visit.    Allergies  Allergen Reactions   Codeine Other (See Comments)    constipation      Review of Systems negative except from HPI  and PMH  Physical Exam   BP 112/60    Pulse 62    Ht 5\' 11"  (1.803 m)    Wt 197 lb (89.4 kg)    SpO2 93%    BMI 27.48 kg/m  Well developed and well nourished in no acute distress HENT normal Neck supple with JVP-flat Clear Device pocket well healed; without hematoma or erythema.  There is no tethering  Regular rate and rhythm, 2/6  murmur Abd-soft with active BS No Clubbing cyanosis  edema Skin-warm and dry A & Oriented  Grossly normal sensory and motor function  ECG AV pacing    Assessment and  Plan  Syncope  Sinus node dysfunction  Hypertension  Lightheadedness with standing  Complete heart block intermittent>> complete  Pacemaker-[employer]  Device function is normal.  Complete heart block now 100% ventricularly paced since 9/22.  Blood  pressure is low today at 112.  With his orthostatic lightheadedness, we will decrease his ibutilide hydrochlorothiazide from 150/25--75/12.5.  Need to check renal function and potassium  Continues on aspirin.  We will have to review chart and not sure what the indication is at this time.        Current medicines are reviewed at length with the patient today .  The patient does not  have concerns regarding medicines.

## 2021-07-12 NOTE — Patient Instructions (Signed)
Medication Instructions:  Your physician has recommended you make the following change in your medication:   **  Please decrease your irbesartan-hydrochlorothiazide (AVALIDE) 150-12.5 MG from 1 tablet by mouth to 1/2 tablet by mouth every morning.   *If you need a refill on your cardiac medications before your next appointment, please call your pharmacy*   Lab Work: BMET today  If you have labs (blood work) drawn today and your tests are completely normal, you will receive your results only by: [place] (if you have MyChart) OR A paper copy in the mail If you have any lab test that is abnormal or we need to change your treatment, we will call you to review the results.   Testing/Procedures: None ordered.    Follow-Up: At [facility], you and your health needs are our priority.  As part of our continuing mission to provide you with exceptional heart care, we have created designated Provider Care Teams.  These Care Teams include your primary Cardiologist (physician) and Advanced Practice Providers (APPs -  Physician Assistants and Nurse Practitioners) who all work together to provide you with the care you need, when you need it.  We recommend signing up for the patient portal called "MyChart".  Sign up information is provided on this After Visit Summary.  MyChart is used to connect with patients for Virtual Visits (Telemedicine).  Patients are able to view lab/test results, encounter notes, upcoming appointments, etc.  Non-urgent messages can be sent to your provider as well.   To learn more about what you can do with MyChart, go to [email or web address].    Your next appointment:   12 months with Dr Caryl Comes

## 2021-07-13 LAB — BASIC METABOLIC PANEL
BUN/Creatinine Ratio: 21 (ref 10–24)
BUN: 23 mg/dL (ref 8–27)
CO2: 24 mmol/L (ref 20–29)
Calcium: 9.4 mg/dL (ref 8.6–10.2)
Chloride: 103 mmol/L (ref 96–106)
Creatinine, Ser: 1.1 mg/dL (ref 0.76–1.27)
Glucose: 95 mg/dL (ref 70–99)
Potassium: 4.5 mmol/L (ref 3.5–5.2)
Sodium: 142 mmol/L (ref 134–144)
eGFR: 69 mL/min/{1.73_m2} (ref >59)

## 2021-07-16 ENCOUNTER — Telehealth: Payer: Self-pay

## 2021-07-16 NOTE — Telephone Encounter (Signed)
Spoke with pt and advised per Dr Klein labs are normal.  Pt verbalizes understanding and thanked RN for the call. 

## 2021-07-16 NOTE — Telephone Encounter (Signed)
-----   Message from Deboraha Sprang, MD sent at 07/13/2021 12:09 PM EST ----- Please Inform Patient that labs are normal  Thanks

## 2021-07-17 DIAGNOSIS — M25552 Pain in left hip: Secondary | ICD-10-CM | POA: Diagnosis not present

## 2021-07-31 ENCOUNTER — Ambulatory Visit: Payer: Medicare PPO | Admitting: Podiatry

## 2021-08-06 ENCOUNTER — Telehealth: Payer: Self-pay | Admitting: Internal Medicine

## 2021-08-06 DIAGNOSIS — M5416 Radiculopathy, lumbar region: Secondary | ICD-10-CM | POA: Diagnosis not present

## 2021-08-06 DIAGNOSIS — M48061 Spinal stenosis, lumbar region without neurogenic claudication: Secondary | ICD-10-CM | POA: Diagnosis not present

## 2021-08-06 NOTE — Telephone Encounter (Signed)
Pt c/o medication issue:  1. Name of Medication: irbesartan-hydrochlorothiazide (AVALIDE) 150-12.5 MG per tablet  2. How are you currently taking this medication (dosage and times per day)? Take 0.5 tablets by mouth every morning.  3. Are you having a reaction (difficulty breathing--STAT)? Pressure went up   4. What is your medication issue? was told to break bp pill in half and after 4 days pressure went up.. went back to normal dosage pressure is normal... please advise

## 2021-08-06 NOTE — Telephone Encounter (Signed)
Spoke with pt who reports after he began taking Irbesartan - HCTZ 150/12.5mg  - 0.5 tablet by mouth daily he began having systolic pressures in the 140's.  Pt states he is uncertain what diastolic numbers were.  He has returned to taking Irbesartan - HCTZ 150/12.5mg   - 1 tablet by mouth daily.  He reports systolic pressures back in the 120's.  Pt advised to continue to monitor his BP and keep a log of both systolic and diastolic pressures.   Will forward to Dr Caryl Comes to make him aware.  Pt verbalizes understanding and agrees with current plan.

## 2021-08-08 DIAGNOSIS — M6281 Muscle weakness (generalized): Secondary | ICD-10-CM | POA: Diagnosis not present

## 2021-08-08 DIAGNOSIS — R269 Unspecified abnormalities of gait and mobility: Secondary | ICD-10-CM | POA: Diagnosis not present

## 2021-08-08 DIAGNOSIS — M48061 Spinal stenosis, lumbar region without neurogenic claudication: Secondary | ICD-10-CM | POA: Diagnosis not present

## 2021-08-15 DIAGNOSIS — R269 Unspecified abnormalities of gait and mobility: Secondary | ICD-10-CM | POA: Diagnosis not present

## 2021-08-15 DIAGNOSIS — M48061 Spinal stenosis, lumbar region without neurogenic claudication: Secondary | ICD-10-CM | POA: Diagnosis not present

## 2021-08-15 DIAGNOSIS — M6281 Muscle weakness (generalized): Secondary | ICD-10-CM | POA: Diagnosis not present

## 2021-08-17 ENCOUNTER — Ambulatory Visit: Payer: Medicare PPO | Admitting: Podiatry

## 2021-08-17 DIAGNOSIS — M5416 Radiculopathy, lumbar region: Secondary | ICD-10-CM | POA: Diagnosis not present

## 2021-08-17 DIAGNOSIS — M48061 Spinal stenosis, lumbar region without neurogenic claudication: Secondary | ICD-10-CM | POA: Diagnosis not present

## 2021-08-22 ENCOUNTER — Other Ambulatory Visit: Payer: Self-pay | Admitting: Podiatry

## 2021-08-22 ENCOUNTER — Telehealth: Payer: Self-pay | Admitting: *Deleted

## 2021-08-22 MED ORDER — PREGABALIN 50 MG PO CAPS: 50.0000 mg | ORAL_CAPSULE | Freq: Two times a day (BID) | ORAL | 2 refills | Status: DC

## 2021-08-22 NOTE — Telephone Encounter (Signed)
Called patient, no answer, left vmessage that medication has been sent to pharmacy.

## 2021-08-22 NOTE — Telephone Encounter (Signed)
Patient is calling for a medication refill (pregablin). Returned the call back to patient to inform that an appointment is required before another refill, no answer and could leave a message. He has an upcoming appointment 08/28/21.

## 2021-08-28 ENCOUNTER — Ambulatory Visit: Payer: Medicare PPO | Admitting: Podiatry

## 2021-08-28 ENCOUNTER — Other Ambulatory Visit: Payer: Self-pay

## 2021-08-28 DIAGNOSIS — G63 Polyneuropathy in diseases classified elsewhere: Secondary | ICD-10-CM

## 2021-08-28 DIAGNOSIS — L97521 Non-pressure chronic ulcer of other part of left foot limited to breakdown of skin: Secondary | ICD-10-CM

## 2021-08-28 DIAGNOSIS — C801 Malignant (primary) neoplasm, unspecified: Secondary | ICD-10-CM | POA: Diagnosis not present

## 2021-08-28 MED ORDER — PREGABALIN 50 MG PO CAPS: 50.0000 mg | ORAL_CAPSULE | Freq: Two times a day (BID) | ORAL | 2 refills | Status: DC

## 2021-08-31 DIAGNOSIS — R269 Unspecified abnormalities of gait and mobility: Secondary | ICD-10-CM | POA: Diagnosis not present

## 2021-08-31 DIAGNOSIS — M6281 Muscle weakness (generalized): Secondary | ICD-10-CM | POA: Diagnosis not present

## 2021-08-31 DIAGNOSIS — M48061 Spinal stenosis, lumbar region without neurogenic claudication: Secondary | ICD-10-CM | POA: Diagnosis not present

## 2021-09-02 NOTE — Progress Notes (Signed)
Subjective: 81 year old male presents the office today for follow-up evaluation of the wounds on his left second toe.  He states he is doing well does not see any skin breakdown or any drainage.  No increased swelling or redness.  Also he does have neuropathy still on Lyrica.  He has no other concerns today.  Objective: AAO x3, NAD DP/PT pulses palpable bilaterally, CRT less than 3 seconds Sensation decreased with Semmes Weinstein monofilament. Hyperkeratotic tissue present along the left second toe area where there is previous skin breakdown.  Upon debridement there is no underlying ulceration triggering signs of infection.  No open lesions noted bilaterally. Digital deformities are noted. 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 -Sharply through the hyperkeratotic tissue with any complications or bleeding.  No ulcerations noted today.  Continue offloading and daily foot inspection monitor for any further skin breakdown. -Continue Lyrica for neuropathy  Return in about 9 weeks (around 10/30/2021).  Trula Slade DPM

## 2021-09-10 DIAGNOSIS — M48061 Spinal stenosis, lumbar region without neurogenic claudication: Secondary | ICD-10-CM | POA: Diagnosis not present

## 2021-09-10 DIAGNOSIS — M6281 Muscle weakness (generalized): Secondary | ICD-10-CM | POA: Diagnosis not present

## 2021-09-10 DIAGNOSIS — R269 Unspecified abnormalities of gait and mobility: Secondary | ICD-10-CM | POA: Diagnosis not present

## 2021-09-11 DIAGNOSIS — M48061 Spinal stenosis, lumbar region without neurogenic claudication: Secondary | ICD-10-CM | POA: Diagnosis not present

## 2021-09-11 DIAGNOSIS — M6281 Muscle weakness (generalized): Secondary | ICD-10-CM | POA: Diagnosis not present

## 2021-09-11 DIAGNOSIS — M5136 Other intervertebral disc degeneration, lumbar region: Secondary | ICD-10-CM | POA: Diagnosis not present

## 2021-09-11 DIAGNOSIS — M545 Low back pain, unspecified: Secondary | ICD-10-CM | POA: Diagnosis not present

## 2021-09-12 DIAGNOSIS — I2510 Atherosclerotic heart disease of native coronary artery without angina pectoris: Secondary | ICD-10-CM | POA: Diagnosis not present

## 2021-09-12 DIAGNOSIS — M1990 Unspecified osteoarthritis, unspecified site: Secondary | ICD-10-CM | POA: Diagnosis not present

## 2021-09-12 DIAGNOSIS — G620 Drug-induced polyneuropathy: Secondary | ICD-10-CM | POA: Diagnosis not present

## 2021-09-12 DIAGNOSIS — M545 Low back pain, unspecified: Secondary | ICD-10-CM | POA: Diagnosis not present

## 2021-09-12 DIAGNOSIS — T451X5D Adverse effect of antineoplastic and immunosuppressive drugs, subsequent encounter: Secondary | ICD-10-CM | POA: Diagnosis not present

## 2021-09-12 DIAGNOSIS — G8929 Other chronic pain: Secondary | ICD-10-CM | POA: Diagnosis not present

## 2021-09-12 DIAGNOSIS — I10 Essential (primary) hypertension: Secondary | ICD-10-CM | POA: Diagnosis not present

## 2021-09-12 DIAGNOSIS — I495 Sick sinus syndrome: Secondary | ICD-10-CM | POA: Diagnosis not present

## 2021-09-12 DIAGNOSIS — I252 Old myocardial infarction: Secondary | ICD-10-CM | POA: Diagnosis not present

## 2021-09-17 DIAGNOSIS — Z85038 Personal history of other malignant neoplasm of large intestine: Secondary | ICD-10-CM | POA: Diagnosis not present

## 2021-09-17 DIAGNOSIS — Z1389 Encounter for screening for other disorder: Secondary | ICD-10-CM | POA: Diagnosis not present

## 2021-09-17 DIAGNOSIS — I10 Essential (primary) hypertension: Secondary | ICD-10-CM | POA: Diagnosis not present

## 2021-09-17 DIAGNOSIS — Z0000 Encounter for general adult medical examination without abnormal findings: Secondary | ICD-10-CM | POA: Diagnosis not present

## 2021-09-17 DIAGNOSIS — E039 Hypothyroidism, unspecified: Secondary | ICD-10-CM | POA: Diagnosis not present

## 2021-09-17 DIAGNOSIS — Z950 Presence of cardiac pacemaker: Secondary | ICD-10-CM | POA: Diagnosis not present

## 2021-09-17 DIAGNOSIS — Z85528 Personal history of other malignant neoplasm of kidney: Secondary | ICD-10-CM | POA: Diagnosis not present

## 2021-09-19 DIAGNOSIS — M48061 Spinal stenosis, lumbar region without neurogenic claudication: Secondary | ICD-10-CM | POA: Diagnosis not present

## 2021-09-19 DIAGNOSIS — M5416 Radiculopathy, lumbar region: Secondary | ICD-10-CM | POA: Diagnosis not present

## 2021-09-24 ENCOUNTER — Ambulatory Visit (INDEPENDENT_AMBULATORY_CARE_PROVIDER_SITE_OTHER): Payer: Medicare PPO

## 2021-09-24 DIAGNOSIS — I495 Sick sinus syndrome: Secondary | ICD-10-CM

## 2021-09-24 LAB — CUP PACEART REMOTE DEVICE CHECK
Battery Remaining Longevity: 62 mo
Battery Remaining Percentage: 56 %
Battery Voltage: 2.98 V
Brady Statistic AP VP Percent: 64 %
Brady Statistic AP VS Percent: 1 %
Brady Statistic AS VP Percent: 36 %
Brady Statistic AS VS Percent: 1 %
Brady Statistic RA Percent Paced: 64 %
Brady Statistic RV Percent Paced: 99 %
Date Time Interrogation Session: 20230227020011
Implantable Lead Implant Date: 20181126
Implantable Lead Implant Date: 20181126
Implantable Lead Location: 753859
Implantable Lead Location: 753860
Implantable Lead Model: 5076
Implantable Lead Model: 5076
Implantable Pulse Generator Implant Date: 20181126
Lead Channel Impedance Value: 440 Ohm
Lead Channel Impedance Value: 460 Ohm
Lead Channel Pacing Threshold Amplitude: 0.5 V
Lead Channel Pacing Threshold Amplitude: 0.875 V
Lead Channel Pacing Threshold Pulse Width: 0.5 ms
Lead Channel Pacing Threshold Pulse Width: 0.5 ms
Lead Channel Sensing Intrinsic Amplitude: 3.3 mV
Lead Channel Sensing Intrinsic Amplitude: 4.9 mV
Lead Channel Setting Pacing Amplitude: 1.125
Lead Channel Setting Pacing Amplitude: 2 V
Lead Channel Setting Pacing Pulse Width: 0.5 ms
Lead Channel Setting Sensing Sensitivity: 2 mV
Pulse Gen Model: 2272
Pulse Gen Serial Number: 8971004

## 2021-09-26 NOTE — Progress Notes (Signed)
Remote pacemaker transmission.   

## 2021-10-11 DIAGNOSIS — M48061 Spinal stenosis, lumbar region without neurogenic claudication: Secondary | ICD-10-CM | POA: Diagnosis not present

## 2021-10-23 DIAGNOSIS — E039 Hypothyroidism, unspecified: Secondary | ICD-10-CM | POA: Diagnosis not present

## 2021-10-25 DIAGNOSIS — C642 Malignant neoplasm of left kidney, except renal pelvis: Secondary | ICD-10-CM | POA: Diagnosis not present

## 2021-10-25 DIAGNOSIS — N281 Cyst of kidney, acquired: Secondary | ICD-10-CM | POA: Diagnosis not present

## 2021-10-30 ENCOUNTER — Ambulatory Visit: Payer: Medicare PPO | Admitting: Podiatry

## 2021-10-30 DIAGNOSIS — B351 Tinea unguium: Secondary | ICD-10-CM

## 2021-10-30 DIAGNOSIS — G63 Polyneuropathy in diseases classified elsewhere: Secondary | ICD-10-CM | POA: Diagnosis not present

## 2021-10-30 DIAGNOSIS — M79675 Pain in left toe(s): Secondary | ICD-10-CM | POA: Diagnosis not present

## 2021-10-30 DIAGNOSIS — M79674 Pain in right toe(s): Secondary | ICD-10-CM

## 2021-10-30 DIAGNOSIS — C801 Malignant (primary) neoplasm, unspecified: Secondary | ICD-10-CM | POA: Diagnosis not present

## 2021-10-30 DIAGNOSIS — M48062 Spinal stenosis, lumbar region with neurogenic claudication: Secondary | ICD-10-CM | POA: Diagnosis not present

## 2021-10-30 MED ORDER — PREGABALIN 50 MG PO CAPS: 50.0000 mg | ORAL_CAPSULE | Freq: Two times a day (BID) | ORAL | 2 refills | Status: DC

## 2021-11-01 ENCOUNTER — Other Ambulatory Visit: Payer: Self-pay | Admitting: Neurological Surgery

## 2021-11-02 ENCOUNTER — Telehealth: Payer: Self-pay | Admitting: *Deleted

## 2021-11-02 NOTE — Progress Notes (Signed)
Subjective: ?81 year old male presents the office today for follow evaluation of callus, wound on his toes as well as for thick and elongated toenails that he cannot trim himself.  He states the callus that was present left second toe is gone.  Denies any drainage or pus or any new ulcers.  Also has neuropathy which is been about the same does not seem to be getting any worse in the last time. ? ?Objective: ?AAO x3, NAD ?DP/PT pulses palpable bilaterally, CRT less than 3 seconds ?Nails are hypertrophic, dystrophic, brittle, discolored, elongated ?10. No surrounding redness or drainage. Tenderness nails 1-5 bilaterally.  Lesion the second toe is resolved.  There is no ulcerations noted at this time.  No open lesions or pre-ulcerative lesions are identified today. ?No pain with calf compression, swelling, warmth, erythema ? ?Assessment: ?Symptomatic onychomycosis, neuropathy ? ?Plan: ?-All treatment options discussed with the patient including all alternatives, risks, complications.  ?-Sharply debrided the nails x10 without any complications or bleeding. ?-Refilled Lyrica ?-Daily foot inspection ?-Patient encouraged to call the office with any questions, concerns, change in symptoms.  ? ?RTC 3 months or sooner if needed ? ?Trula Slade DPM ? ?

## 2021-11-02 NOTE — Telephone Encounter (Signed)
? ?  Pre-operative Risk Assessment  ?  ?Patient Name: Jorge Ross  ?DOB: [birth year] ?MRN: [id number]  ? ?  ? ?Request for Surgical Clearance   ? ?Procedure:   L2-3, L3-4, L4-5 LUMBAR LAMINECTOMY ? ?Date of Surgery:  Clearance 11/16/21                              ?   ?Surgeon:  DR. Sherley Bounds ?Surgeon's Group or Practice Name:  [facility] ?Phone number:  [phone]; Loann QuillLorriane Shire [address] ?Fax number:  [phone] ATTN: Lorriane Shire  ?  ?Type of Clearance Requested:   ?- Medical  ?- Pharmacy:  Hold Aspirin   ?  ?Type of Anesthesia:  General  ?  ?Additional requests/questions:   ? ?Signed, ?Julaine Hua   ?11/02/2021, 9:00 AM  ? ?

## 2021-11-02 NOTE — Telephone Encounter (Signed)
? ?  Patient Name: Jorge Ross  ?DOB: 10-13-1940 ?MRN: [id number] ? ?Primary Cardiologist: Virl Axe, MD ? ?Chart reviewed as part of pre-operative protocol coverage.  ? 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 ? ?Echo in 2018 showed EF 60 to 65%, no RWMA, no significant valvular abnormalities. CT of the chest from 2020 showed cardiac and coronary artery atherosclerosis (scant). ? ?He was last seen in the office on 07/12/2021 and did report some orthostatic lightheadedness-ibutilide-hydrochlorothiazide was decreased.  Follow-up was recommended in 1 year. ? ?We received surgical clearance request for L2-3, L3-4, L4-5 lumbar laminectomy scheduled for 11/16/2021 with request to hold aspirin prior to procedure.  ? ?Dr. Caryl Comes, please advise on holding aspirin prior to surgery. ? ?Please route your response to P CV DIV PREOP.  ? ?Thank you. ? ? ?Lenna Sciara, NP ?11/02/2021, 2:49 PM ? ? ?

## 2021-11-05 NOTE — Telephone Encounter (Signed)
Primary Cardiologist:Steven Caryl Comes, MD ? ?Chart reviewed as part of pre-operative protocol coverage. Because of Jorge Ross's past medical history and time since last visit, he/she will require a virtual visit/telephone call in order to better assess preoperative cardiovascular risk. ? ?Pre-op covering staff: ?- Please contact patient, obtain consent, and schedule appointment  ? ?Per Dr. Caryl Comes, patient may hold aspirin for 10 days prior to procedure. ? ?Emmaline Life, NP-C ? ?  ?11/05/2021, 11:46 AM ?[facility] ?[address] ?Office [phone] Fax [phone] ? ?

## 2021-11-05 NOTE — Telephone Encounter (Signed)
Left message for the pt to call the office to set up tele pre op appt.  

## 2021-11-06 ENCOUNTER — Telehealth: Payer: Self-pay | Admitting: *Deleted

## 2021-11-06 NOTE — Telephone Encounter (Signed)
Pt agreeable to plan of care for tele pre op appt 11/08/21 @ 11 am.  ?Med rec and consent have been done. ? ?  ?Patient Consent for Virtual Visit  ? ? ?   ? ?Jorge Ross has provided verbal consent on 11/06/2021 for a virtual visit (video or telephone). ? ? ?CONSENT FOR VIRTUAL VISIT FOR:  Jorge Ross  ?By participating in this virtual visit I agree to the following: ? ?I hereby voluntarily request, consent and authorize [facility] and its employed or contracted physicians, physician assistants, nurse practitioners or other licensed health care professionals (the Practitioner), to provide me with telemedicine health care services (the ?Services") as deemed necessary by the treating Practitioner. I acknowledge and consent to receive the Services by the Practitioner via telemedicine. I understand that the telemedicine visit will involve communicating with the Practitioner through live audiovisual communication technology and the disclosure of certain medical information by electronic transmission. I acknowledge that I have been given the opportunity to request an in-person assessment or other available alternative prior to the telemedicine visit and am voluntarily participating in the telemedicine visit. ? ?I understand that I have the right to withhold or withdraw my consent to the use of telemedicine in the course of my care at any time, without affecting my right to future care or treatment, and that the Practitioner or I may terminate the telemedicine visit at any time. I understand that I have the right to inspect all information obtained and/or recorded in the course of the telemedicine visit and may receive copies of available information for a reasonable fee.  I understand that some of the potential risks of receiving the Services via telemedicine include:  ?Delay or interruption in medical evaluation due to technological equipment failure or disruption; ?Information transmitted may not be sufficient  (e.g. poor resolution of images) to allow for appropriate medical decision making by the Practitioner; and/or  ?In rare instances, security protocols could fail, causing a breach of personal health information. ? ?Furthermore, I acknowledge that it is my responsibility to provide information about my medical history, conditions and care that is complete and accurate to the best of my ability. I acknowledge that Practitioner's advice, recommendations, and/or decision may be based on factors not within their control, such as incomplete or inaccurate data provided by me or distortions of diagnostic images or specimens that may result from electronic transmissions. I understand that the practice of medicine is not an exact science and that Practitioner makes no warranties or guarantees regarding treatment outcomes. I acknowledge that a copy of this consent can be made available to me via my patient portal ([facility]), or I can request a printed copy by calling the office of [facility].   ? ?I understand that my insurance will be billed for this visit.  ? ?I have read or had this consent read to me. ?I understand the contents of this consent, which adequately explains the benefits and risks of the Services being provided via telemedicine.  ?I have been provided ample opportunity to ask questions regarding this consent and the Services and have had my questions answered to my satisfaction. ?I give my informed consent for the services to be provided through the use of telemedicine in my medical care ? ? ? ?

## 2021-11-06 NOTE — Telephone Encounter (Signed)
Pt agreeable to plan of care for tele pre op appt 11/08/21 @ 11 am.  ?Med rec and consent have been done. ?

## 2021-11-08 ENCOUNTER — Ambulatory Visit (INDEPENDENT_AMBULATORY_CARE_PROVIDER_SITE_OTHER): Payer: Medicare PPO | Admitting: Nurse Practitioner

## 2021-11-08 DIAGNOSIS — Z01810 Encounter for preprocedural cardiovascular examination: Secondary | ICD-10-CM

## 2021-11-08 NOTE — Progress Notes (Signed)
? ?Virtual Visit via Telephone Note  ? ?This visit type was conducted due to national recommendations for restrictions regarding the COVID-19 Pandemic (e.g. social distancing) in an effort to limit this patient's exposure and mitigate transmission in our community.  Due to his co-morbid illnesses, this patient is at least at moderate risk for complications without adequate follow up.  This format is felt to be most appropriate for this patient at this time.  The patient did not have access to video technology/had technical difficulties with video requiring transitioning to audio format only (telephone).  All issues noted in this document were discussed and addressed.  No physical exam could be performed with this format.  Please refer to the patient's chart for his  consent to telehealth for [facility]. ? ?Evaluation Performed:  Preoperative cardiovascular risk assessment ?_____________  ? ?Date:  11/08/2021  ? ?Patient ID:  Jorge Ross, Jorge Ross Feb 12, 79, 1942, MRN [id number] ?Patient Location:  ?Home ?Provider location:   ?Office ? ?Primary Care Provider:  Seward Carol, MD ?Primary Cardiologist:  Virl Axe, MD ? ?Chief Complaint  ?  ?81 y.o. y/o male with a h/o sinus node dysfunction s/p PPM, left kidney cancer s/p nephrectomy, colon cancer s/p colectomy who is pending L2-3, L3-4, L4-5 lumbar laminectomy, and presents today for telephonic preoperative cardiovascular risk assessment. ? ?Past Medical History  ?  ?Past Medical History:  ?Diagnosis Date  ? 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 Chronic kidney disease   ? Colon cancer ([place]) 2015  ? Hypertension   ? Myocardial infarction [facility])   ? renal cancer 2015  ? kidney left  ? Unspecified disorder of intestine   ? ?Past Surgical History:  ?Procedure Laterality Date  ? CERVICAL SPINE SURGERY    ? 3 titamum screws  ? COLECTOMY  03-2014  ? COLON SURGERY  04/01/14  ? COLONOSCOPY  2007  ? normal   ? CYSTOSCOPY    ? 03-15-16  ? HERNIA REPAIR Bilateral   ? x 3 total   ? PACEMAKER IMPLANT  N/A 06/23/2017  ? Procedure: PACEMAKER IMPLANT;  Surgeon: Deboraha Sprang, MD;  Location: [facility];  Service: Cardiovascular;  Laterality: N/A;  ? PORT-A-CATH REMOVAL N/A 02/21/2016  ? Procedure: REMOVAL PORT-A-CATH;  Surgeon: Stark Klein, MD;  Location: [facility];  Service: General;  Laterality: N/A;  ? PORTACATH PLACEMENT N/A 05/24/2014  ? Procedure: INSERTION PORT-A-CATH;  Surgeon: Stark Klein, MD;  Location: [facility];  Service: General;  Laterality: N/A;  ? ROBOT ASSISTED LAPAROSCOPIC NEPHRECTOMY Left 04/01/2014  ? Procedure: ROBOTIC ASSISTED LAPAROSCOPIC LEFT PARTIAL NEPHRECTOMY;  Surgeon: Alexis Frock, MD;  Location: [facility];  Service: Urology;  Laterality: Left;  ? ROTATOR CUFF REPAIR Left   ? thumb ligament surgery Right   ? ? ?Allergies ? ?Allergies  ?Allergen Reactions  ? Codeine Other (See Comments)  ?  constipation  ? ? ?History of Present Illness  ?  ?Jorge Ross is a 81 y.o. male who presents via audio/video conferencing for a telehealth visit today.  Pt was last seen in cardiology clinic on 07/12/21 by Dr. Caryl Comes.  At that time Jorge Ross was doing well.  The patient is now pending lumbar laminectomy.  Since his last visit, he has been doing well. He denies chest pain, dyspnea, edema, orthopnea, PND, presyncope, syncope, palpitations. No recent change in activity tolerance although he is somewhat limited by back pain.  ? ? ?Home Medications  ?  ?Prior to Admission medications   ?  Medication Sig Start Date End Date Taking? Authorizing Provider  ?acetaminophen (TYLENOL) 500 MG tablet Take 500 mg by mouth every 6 (six) hours as needed.    [provider]  ?aspirin 81 MG tablet Take 81 mg by mouth daily.      [provider]  ?irbesartan-hydrochlorothiazide (AVALIDE) 150-12.5 MG per tablet Take 1 tablet by mouth every morning. 01/25/14   [provider]  ?Multiple Vitamin (MULTIVITAMIN) capsule Take 1 capsule by mouth daily.      [provider]  ?naproxen sodium (ANAPROX) 220 MG tablet Take 220 mg by mouth 2 (two) times daily with a meal. ?Patient not taking: Reported on 11/06/2021    [provider]  ?pregabalin (LYRICA) 50 MG capsule Take 1 capsule (50 mg total) by mouth 2 (two) times daily. ?Patient taking differently: Take 50 mg by mouth daily. 10/30/21   Trula Slade, DPM  ?traMADol (ULTRAM) 50 MG tablet Take 50 mg by mouth every 4 (four) hours as needed for pain. 07/09/21   [provider]  ?vitamin E 400 UNIT capsule Take 400 Units by mouth daily.     [provider]  ? ? ?Physical Exam  ?  ?Vital Signs:  Jorge Ross does not have vital signs available for review today. ? ?Given telephonic nature of communication, physical exam is limited. ?AAOx3. NAD. Normal affect.  Speech and respirations are unlabored. ? ?Accessory Clinical Findings  ?  ?None ? ?Assessment & Plan  ?  ?1.  Preoperative Cardiovascular Risk Assessment: Patient is doing well from a cardiac perspective. He may proceed to surgery without further cardiac testing. According to the Revised Cardiac Risk Index (RCRI), his Perioperative Risk of Major Cardiac Event is (%): 0.9. His Functional Capacity in METs is: 5.07 according to the Duke Activity Status Index (DASI). ? ?Routing to device clinic for advisement regarding patient's pacemaker.  ? ?A copy of this note will be routed to requesting surgeon. ? ?Time:   ?Today, I have spent 10 minutes with the patient with telehealth technology discussing medical history, symptoms, and management plan.   ? ? ?Emmaline Life, NP-C ? ?  ?11/08/2021, 11:11 AM ?[facility] ?[address] ?Office [phone] Fax [phone] ? ? ?

## 2021-11-09 ENCOUNTER — Encounter: Payer: Self-pay | Admitting: Internal Medicine

## 2021-11-09 DIAGNOSIS — Z85528 Personal history of other malignant neoplasm of kidney: Secondary | ICD-10-CM | POA: Diagnosis not present

## 2021-11-09 DIAGNOSIS — I10 Essential (primary) hypertension: Secondary | ICD-10-CM | POA: Diagnosis not present

## 2021-11-09 DIAGNOSIS — Z85038 Personal history of other malignant neoplasm of large intestine: Secondary | ICD-10-CM | POA: Diagnosis not present

## 2021-11-09 DIAGNOSIS — Z950 Presence of cardiac pacemaker: Secondary | ICD-10-CM | POA: Diagnosis not present

## 2021-11-09 DIAGNOSIS — M48061 Spinal stenosis, lumbar region without neurogenic claudication: Secondary | ICD-10-CM | POA: Diagnosis not present

## 2021-11-09 NOTE — Progress Notes (Signed)
PERIOPERATIVE PRESCRIPTION FOR IMPLANTED CARDIAC DEVICE PROGRAMMING ? ?Patient Information: ?Name:  Jorge Ross  ?DOB:  Dec 23, 1940  ?MRN:  [id number]  ?  ?Patient Name: Jorge Ross  ?DOB: 1941-07-01 ?MRN: [id number]  ?  ?  ?  ?Request for Surgical Clearance   ?  ?Procedure:   L2-3, L3-4, L4-5 LUMBAR LAMINECTOMY ?  ?Date of Surgery:  Clearance 11/16/21                              ?   ?Surgeon:  DR. Sherley Bounds ?Surgeon's Group or Practice Name:  [facility] ?Phone number:  [phone]; Loann QuillLorriane Shire [address] ?Fax number:  [phone] ATTN: Lorriane Shire  ?Device Information: ? ?Clinic EP Physician:  Virl Axe, MD  ? ?Device Type:  Pacemaker ?Armed forces logistics/support/administrative officer #:  St. Jude/Abbott: [phone] ?Pacemaker Dependent?:  Yes.   ?Date of Last Device Check:  09/25/2021 Normal Device Function?:  Yes.   ? ?Electrophysiologist's Recommendations: ? ?Have magnet available. ?Provide continuous ECG monitoring when magnet is used or reprogramming is to be performed.  ?Procedure may interfere with device function.  Magnet should be placed over device during procedure. ? ?Per Device Clinic Standing Orders, ?Wanda Plump, RN  ?4:27 PM 11/09/2021  ?

## 2021-11-16 ENCOUNTER — Encounter (HOSPITAL_COMMUNITY): Payer: Self-pay

## 2021-11-16 NOTE — Progress Notes (Signed)
Surgical Instructions ? ? ? Your procedure is scheduled on 11/23/21. ? Report to [facility] Main Entrance "A" at 5:30 A.M., then check in with the Admitting office. ? Call this number if you have problems the morning of surgery: ? [phone] ? ? If you have any questions prior to your surgery date call [phone]: Open Monday-Friday 8am-4pm ? ? ? Remember: ? Do not eat or drink after midnight the night before your surgery ? ? ?  ? Take these medicines the morning of surgery with A SIP OF WATER:  ?pregabalin (LYRICA) ? ?AS NEEDED: ?acetaminophen (TYLENOL)  ?polyvinyl alcohol (LIQUIFILM TEARS)  ?traMADol Veatrice Bourbon) ? ? ?As of today, STOP taking any Aspirin (unless otherwise instructed by your surgeon) Aleve, Naproxen, Ibuprofen, Motrin, Advil, Goody's, BC's, all herbal medications, fish oil, and all vitamins. ? ?         ?Do not wear jewelry  ?Do not wear lotions, powders, colognes, or deodorant. ?Do not shave 48 hours prior to surgery.  Men may shave face and neck. ?Do not bring valuables to the hospital. ? ? ?[facility] is not responsible for any belongings or valuables. .  ? ?Do NOT Smoke (Tobacco/Vaping)  24 hours prior to your procedure ? ?If you use a CPAP at night, you may bring your mask for your overnight stay. ?  ?Contacts, glasses, hearing aids, dentures or partials may not be worn into surgery, please bring cases for these belongings ?  ?For patients admitted to the hospital, discharge time will be determined by your treatment team. ?  ?Patients discharged the day of surgery will not be allowed to drive home, and someone needs to stay with them for 24 hours. ? ? ?SURGICAL WAITING ROOM VISITATION ?Patients having surgery or a procedure in a hospital may have two support people. ?Children under the age of 20 must have an adult with them who is not the patient. ?They may stay in the waiting area during the procedure and may switch out with other visitors. If the patient needs to stay at the hospital during  part of their recovery, the visitor guidelines for inpatient rooms apply. ? ?Please refer to the [facility] website for the visitor guidelines for Inpatients (after your surgery is over and you are in a regular room).  ? ? ?Special instructions:   ? ?Oral Hygiene is also important to reduce your risk of infection.  Remember - BRUSH YOUR TEETH THE MORNING OF SURGERY WITH YOUR REGULAR TOOTHPASTE ? ? ?[facility]- Preparing For Surgery ? ?Before surgery, you can play an important role. Because skin is not sterile, your skin needs to be as free of germs as possible. You can reduce the number of germs on your skin by washing with CHG (chlorahexidine gluconate) Soap before surgery.  CHG is an antiseptic cleaner which kills germs and bonds with the skin to continue killing germs even after washing.   ? ? ?Please do not use if you have an allergy to CHG or antibacterial soaps. If your skin becomes reddened/irritated stop using the CHG.  ?Do not shave (including legs and underarms) for at least 48 hours prior to first CHG shower. It is OK to shave your face. ? ?Please follow these instructions carefully. ?  ? ? Shower the NIGHT BEFORE SURGERY and the MORNING OF SURGERY with CHG Soap.  ? If you chose to wash your hair, wash your hair first as usual with your normal shampoo. After you shampoo, rinse your hair and body thoroughly  to remove the shampoo.  Then [employer] and genitals (private parts) with your normal soap and rinse thoroughly to remove soap. ? ?After that Use CHG Soap as you would any other liquid soap. You can apply CHG directly to the skin and wash gently with a scrungie or a clean washcloth.  ? ?Apply the CHG Soap to your body ONLY FROM THE NECK DOWN.  Do not use on open wounds or open sores. Avoid contact with your eyes, ears, mouth and genitals (private parts). Wash Face and genitals (private parts)  with your normal soap.  ? ?Wash thoroughly, paying special attention to the area where your surgery will be  performed. ? ?Thoroughly rinse your body with warm water from the neck down. ? ?DO NOT shower/wash with your normal soap after using and rinsing off the CHG Soap. ? ?Pat yourself dry with a CLEAN TOWEL. ? ?Wear CLEAN PAJAMAS to bed the night before surgery ? ?Place CLEAN SHEETS on your bed the night before your surgery ? ?DO NOT SLEEP WITH PETS. ? ? ?Day of Surgery: ? ?Take a shower with CHG soap. ?Wear Clean/Comfortable clothing the morning of surgery ?Do not apply any deodorants/lotions.   ?Remember to brush your teeth WITH YOUR REGULAR TOOTHPASTE. ? ? ? ?If you received a COVID test during your pre-op visit, it is requested that you wear a mask when out in public, stay away from anyone that may not be feeling well, and notify your surgeon if you develop symptoms. If you have been in contact with anyone that has tested positive in the last 10 days, please notify your surgeon. ? ?  ?Please read over the following fact sheets that you were given.   ?

## 2021-11-19 ENCOUNTER — Encounter (HOSPITAL_COMMUNITY): Payer: Self-pay

## 2021-11-19 ENCOUNTER — Other Ambulatory Visit: Payer: Self-pay

## 2021-11-19 ENCOUNTER — Encounter (HOSPITAL_COMMUNITY)
Admission: RE | Admit: 2021-11-19 | Discharge: 2021-11-19 | Disposition: A | Discharge status: Home / Self Care | Payer: Medicare PPO | Source: Ambulatory Visit | Attending: Neurological Surgery | Admitting: Neurological Surgery

## 2021-11-19 VITALS — BP 128/71 | HR 82 | Temp 97.9°F | Resp 17 | Ht 71.0 in | Wt 189.6 lb

## 2021-11-19 DIAGNOSIS — Z981 Arthrodesis status: Secondary | ICD-10-CM | POA: Insufficient documentation

## 2021-11-19 DIAGNOSIS — Z85528 Personal history of other malignant neoplasm of kidney: Secondary | ICD-10-CM | POA: Diagnosis not present

## 2021-11-19 DIAGNOSIS — I10 Essential (primary) hypertension: Secondary | ICD-10-CM

## 2021-11-19 DIAGNOSIS — Z01812 Encounter for preprocedural laboratory examination: Secondary | ICD-10-CM | POA: Insufficient documentation

## 2021-11-19 DIAGNOSIS — Z905 Acquired absence of kidney: Secondary | ICD-10-CM | POA: Diagnosis not present

## 2021-11-19 DIAGNOSIS — Z85038 Personal history of other malignant neoplasm of large intestine: Secondary | ICD-10-CM | POA: Insufficient documentation

## 2021-11-19 DIAGNOSIS — N189 Chronic kidney disease, unspecified: Secondary | ICD-10-CM | POA: Insufficient documentation

## 2021-11-19 DIAGNOSIS — M48061 Spinal stenosis, lumbar region without neurogenic claudication: Secondary | ICD-10-CM | POA: Insufficient documentation

## 2021-11-19 DIAGNOSIS — Z950 Presence of cardiac pacemaker: Secondary | ICD-10-CM | POA: Diagnosis not present

## 2021-11-19 DIAGNOSIS — I129 Hypertensive chronic kidney disease with stage 1 through stage 4 chronic kidney disease, or unspecified chronic kidney disease: Secondary | ICD-10-CM | POA: Diagnosis not present

## 2021-11-19 DIAGNOSIS — I252 Old myocardial infarction: Secondary | ICD-10-CM | POA: Diagnosis not present

## 2021-11-19 DIAGNOSIS — Z01818 Encounter for other preprocedural examination: Secondary | ICD-10-CM

## 2021-11-19 HISTORY — DX: Burn of unspecified body region, unspecified degree: T30.0

## 2021-11-19 HISTORY — DX: Presence of cardiac pacemaker: Z95.0

## 2021-11-19 LAB — CBC
HCT: 42 % (ref 39.0–52.0)
Hemoglobin: 14.2 g/dL (ref 13.0–17.0)
MCH: 33.7 pg (ref 26.0–34.0)
MCHC: 33.8 g/dL (ref 30.0–36.0)
MCV: 99.8 fL (ref 80.0–100.0)
Platelets: 214 10*3/uL (ref 150–400)
RBC: 4.21 MIL/uL — ABNORMAL LOW (ref 4.22–5.81)
RDW: 12.1 % (ref 11.5–15.5)
WBC: 5.3 10*3/uL (ref 4.0–10.5)
nRBC: 0 % (ref 0.0–0.2)

## 2021-11-19 LAB — BASIC METABOLIC PANEL
Anion gap: 9 (ref 5–15)
BUN: 24 mg/dL — ABNORMAL HIGH (ref 8–23)
CO2: 27 mmol/L (ref 22–32)
Calcium: 9.4 mg/dL (ref 8.9–10.3)
Chloride: 104 mmol/L (ref 98–111)
Creatinine, Ser: 1.3 mg/dL — ABNORMAL HIGH (ref 0.61–1.24)
GFR, Estimated: 56 mL/min — ABNORMAL LOW (ref >60)
Glucose, Bld: 105 mg/dL — ABNORMAL HIGH (ref 70–99)
Potassium: 4.2 mmol/L (ref 3.5–5.1)
Sodium: 140 mmol/L (ref 135–145)

## 2021-11-19 LAB — PROTIME-INR
INR: 1 (ref 0.8–1.2)
Prothrombin Time: 12.6 seconds (ref 11.4–15.2)

## 2021-11-19 LAB — SURGICAL PCR SCREEN
MRSA, PCR: NEGATIVE
Staphylococcus aureus: NEGATIVE

## 2021-11-19 NOTE — Anesthesia Preprocedure Evaluation (Addendum)
Anesthesia Evaluation  ?Patient identified by MRN, date of birth, ID band ?Patient awake ? ? ? ?Reviewed: ?Allergy & Precautions, NPO status , Patient's Chart, lab work & pertinent test results ? ?Airway ?Mallampati: II ? ?TM Distance: >3 FB ?Neck ROM: Full ? ? ? Dental ?no notable dental hx. ?(+) Teeth Intact, Caps, Dental Advisory Given ?  ?Pulmonary ?neg pulmonary ROS,  ?  ?Pulmonary exam normal ?breath sounds clear to auscultation ? ? ? ? ? ? Cardiovascular ?hypertension, Pt. on medications ?+ Past MI  ?Normal cardiovascular exam+ dysrhythmias + pacemaker  ?Rhythm:Regular Rate:Normal ? ?Hx/o intermittent CHB S/P PPM insertion ? ?EKG  ?Ventricular paced rhythm ?  ?Neuro/Psych ?negative neurological ROS ? negative psych ROS  ? GI/Hepatic ?Neg liver ROS, Hx/o colon Ca S/P Partial colectomy and Chemo Rx ?  ?Endo/Other  ?Hypothyroidism Pre diabetes ? Renal/GU ?Renal InsufficiencyRenal diseaseHx/o renal Ca S/P partial nephrectomy  ?negative genitourinary ?  ?Musculoskeletal ? ?(+) Arthritis , Spinal stenosis L2-L5  ? Abdominal ?  ?Peds ? Hematology ?negative hematology ROS ?(+)   ?Anesthesia Other Findings ? ? Reproductive/Obstetrics ?ED ? ?  ? ? ? ? ? ? ? ? ? ? ? ? ? ?  ?  ? ? ? ? ? ?Anesthesia Physical ?Anesthesia Plan ? ?ASA: 3 ? ?Anesthesia Plan: General  ? ?Post-op Pain Management: Lidocaine infusion*, Ofirmev IV (intra-op)*, Precedex and Dilaudid IV  ? ?Induction: Intravenous ? ?PONV Risk Score and Plan: 3 and Treatment may vary due to age or medical condition, Ondansetron and Dexamethasone ? ?Airway Management Planned: Oral ETT ? ?Additional Equipment: None ? ?Intra-op Plan:  ? ?Post-operative Plan: Extubation in OR ? ?Informed Consent: I have reviewed the patients History and Physical, chart, labs and discussed the procedure including the risks, benefits and alternatives for the proposed anesthesia with the patient or authorized representative who has indicated his/her  understanding and acceptance.  ? ? ? ?Dental advisory given ? ?Plan Discussed with: CRNA and Anesthesiologist ? ?Anesthesia Plan Comments: (Magnet over Pacemaker for procedure. ? ? ?PAT note written 11/19/2021 by Myra Gianotti, PA-C. Has St. Jude/Abbott PPM. ?)  ? ? ? ? ?Anesthesia Quick Evaluation ? ?
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
## 2021-11-19 NOTE — Progress Notes (Signed)
PCP - Dr. Seward Carol ?Cardiologist - Dr. Caryl Comes with [facility] clearance 11/08/21 ? ?PPM/ICD - Pacer St Jude/[employer] ?Device Orders - Requested ?Rep Notified - [facility] [employer] ? ?Chest x-ray - 06/24/17 ?EKG - 07/12/21 ?Stress Test - Denies ?ECHO - Denies ?Cardiac Cath - Denies ? ?Sleep Study - Denies ? ?Dm - Denies ? ?Aspirin Instructions: Per patient stopped last week Tuesday or Wednesday ? ?Anesthesia review: Yes cardiac history ? ?Patient denies shortness of breath, fever, cough and chest pain at PAT appointment ? ? ?All instructions explained to the patient, with a verbal understanding of the material. Patient agrees to go over the instructions while at home for a better understanding. . The opportunity to ask questions was provided. ? ? ?

## 2021-11-19 NOTE — Progress Notes (Addendum)
Device orders in chart. ? ?Paged [employer] [employer] representative with [employer] to make him aware of patient's upcoming surgery.  ? ?Received call back from [place] @ 1209. Stated he would be here at 0700. ?

## 2021-11-22 ENCOUNTER — Encounter (HOSPITAL_COMMUNITY): Payer: Self-pay | Admitting: Neurological Surgery

## 2021-11-23 ENCOUNTER — Encounter (HOSPITAL_COMMUNITY): Payer: Self-pay | Admitting: Neurological Surgery

## 2021-11-23 ENCOUNTER — Ambulatory Visit (HOSPITAL_COMMUNITY): Payer: Medicare PPO

## 2021-11-23 ENCOUNTER — Other Ambulatory Visit: Payer: Self-pay

## 2021-11-23 ENCOUNTER — Ambulatory Visit (HOSPITAL_BASED_OUTPATIENT_CLINIC_OR_DEPARTMENT_OTHER): Payer: Medicare PPO | Admitting: Anesthesiology

## 2021-11-23 ENCOUNTER — Encounter (HOSPITAL_COMMUNITY)
Admission: RE | Disposition: A | Discharge status: Skilled Nursing Facility | Payer: Self-pay | Source: Home / Self Care | Attending: Neurological Surgery

## 2021-11-23 ENCOUNTER — Ambulatory Visit (HOSPITAL_COMMUNITY): Payer: Medicare PPO | Admitting: Vascular Surgery

## 2021-11-23 ENCOUNTER — Inpatient Hospital Stay (HOSPITAL_COMMUNITY)
Admission: RE | Admit: 2021-11-23 | Discharge: 2021-11-28 | DRG: 520 | Disposition: A | Discharge status: Skilled Nursing Facility | Payer: Medicare PPO | Attending: Neurological Surgery | Admitting: Neurological Surgery

## 2021-11-23 DIAGNOSIS — Z85038 Personal history of other malignant neoplasm of large intestine: Secondary | ICD-10-CM | POA: Diagnosis not present

## 2021-11-23 DIAGNOSIS — Z7401 Bed confinement status: Secondary | ICD-10-CM | POA: Diagnosis not present

## 2021-11-23 DIAGNOSIS — Z801 Family history of malignant neoplasm of trachea, bronchus and lung: Secondary | ICD-10-CM | POA: Diagnosis not present

## 2021-11-23 DIAGNOSIS — Z885 Allergy status to narcotic agent status: Secondary | ICD-10-CM | POA: Diagnosis not present

## 2021-11-23 DIAGNOSIS — I10 Essential (primary) hypertension: Secondary | ICD-10-CM | POA: Diagnosis present

## 2021-11-23 DIAGNOSIS — K5903 Drug induced constipation: Secondary | ICD-10-CM | POA: Diagnosis not present

## 2021-11-23 DIAGNOSIS — Z950 Presence of cardiac pacemaker: Secondary | ICD-10-CM

## 2021-11-23 DIAGNOSIS — Z9889 Other specified postprocedural states: Secondary | ICD-10-CM | POA: Diagnosis not present

## 2021-11-23 DIAGNOSIS — Z85528 Personal history of other malignant neoplasm of kidney: Secondary | ICD-10-CM | POA: Diagnosis not present

## 2021-11-23 DIAGNOSIS — R7303 Prediabetes: Secondary | ICD-10-CM | POA: Diagnosis present

## 2021-11-23 DIAGNOSIS — I252 Old myocardial infarction: Secondary | ICD-10-CM | POA: Diagnosis not present

## 2021-11-23 DIAGNOSIS — M1990 Unspecified osteoarthritis, unspecified site: Secondary | ICD-10-CM | POA: Diagnosis present

## 2021-11-23 DIAGNOSIS — Z20822 Contact with and (suspected) exposure to covid-19: Secondary | ICD-10-CM | POA: Diagnosis not present

## 2021-11-23 DIAGNOSIS — R262 Difficulty in walking, not elsewhere classified: Secondary | ICD-10-CM | POA: Diagnosis not present

## 2021-11-23 DIAGNOSIS — M48062 Spinal stenosis, lumbar region with neurogenic claudication: Secondary | ICD-10-CM | POA: Diagnosis present

## 2021-11-23 DIAGNOSIS — Z7982 Long term (current) use of aspirin: Secondary | ICD-10-CM | POA: Diagnosis not present

## 2021-11-23 DIAGNOSIS — M48061 Spinal stenosis, lumbar region without neurogenic claudication: Secondary | ICD-10-CM | POA: Diagnosis not present

## 2021-11-23 DIAGNOSIS — Z905 Acquired absence of kidney: Secondary | ICD-10-CM

## 2021-11-23 DIAGNOSIS — Z8042 Family history of malignant neoplasm of prostate: Secondary | ICD-10-CM

## 2021-11-23 DIAGNOSIS — Z4789 Encounter for other orthopedic aftercare: Secondary | ICD-10-CM | POA: Diagnosis not present

## 2021-11-23 DIAGNOSIS — I739 Peripheral vascular disease, unspecified: Secondary | ICD-10-CM | POA: Diagnosis present

## 2021-11-23 DIAGNOSIS — E039 Hypothyroidism, unspecified: Secondary | ICD-10-CM | POA: Diagnosis not present

## 2021-11-23 DIAGNOSIS — M2550 Pain in unspecified joint: Secondary | ICD-10-CM | POA: Diagnosis not present

## 2021-11-23 DIAGNOSIS — Z981 Arthrodesis status: Secondary | ICD-10-CM | POA: Diagnosis not present

## 2021-11-23 HISTORY — PX: LUMBAR LAMINECTOMY/DECOMPRESSION MICRODISCECTOMY: SHX5026

## 2021-11-23 SURGERY — LUMBAR LAMINECTOMY/DECOMPRESSION MICRODISCECTOMY 3 LEVELS
Anesthesia: General | Site: Back | Laterality: Bilateral

## 2021-11-23 MED ORDER — METHOCARBAMOL 1000 MG/10ML IJ SOLN
500.0000 mg | Freq: Four times a day (QID) | INTRAVENOUS | Status: DC | PRN
  Filled 2021-11-23: qty 5

## 2021-11-23 MED ORDER — MORPHINE SULFATE (PF) 2 MG/ML IV SOLN: 2.0000 mg | INTRAVENOUS | Status: DC | PRN

## 2021-11-23 MED ORDER — FENTANYL CITRATE (PF) 250 MCG/5ML IJ SOLN
INTRAMUSCULAR | Status: DC | PRN
  Administered 2021-11-23: 50 ug via INTRAVENOUS
  Administered 2021-11-23: 150 ug via INTRAVENOUS

## 2021-11-23 MED ORDER — ACETAMINOPHEN 500 MG PO TABS
1000.0000 mg | ORAL_TABLET | Freq: Four times a day (QID) | ORAL | Status: AC
  Administered 2021-11-23 – 2021-11-24 (×3): 1000 mg via ORAL
  Filled 2021-11-23 (×4): qty 2

## 2021-11-23 MED ORDER — PHENYLEPHRINE HCL (PRESSORS) 10 MG/ML IV SOLN
INTRAVENOUS | Status: DC | PRN
  Administered 2021-11-23: 120 ug via INTRAVENOUS

## 2021-11-23 MED ORDER — CHLORHEXIDINE GLUCONATE CLOTH 2 % EX PADS: 6.0000 | MEDICATED_PAD | Freq: Once | CUTANEOUS | Status: DC

## 2021-11-23 MED ORDER — ASPIRIN EC 81 MG PO TBEC
81.0000 mg | DELAYED_RELEASE_TABLET | Freq: Every day | ORAL | Status: DC
  Administered 2021-11-23 – 2021-11-27 (×5): 81 mg via ORAL
  Filled 2021-11-23 (×5): qty 1

## 2021-11-23 MED ORDER — POTASSIUM CHLORIDE IN NACL 20-0.9 MEQ/L-% IV SOLN
INTRAVENOUS | Status: DC
  Filled 2021-11-23: qty 1000

## 2021-11-23 MED ORDER — PHENYLEPHRINE HCL-NACL 20-0.9 MG/250ML-% IV SOLN
INTRAVENOUS | Status: DC | PRN
  Administered 2021-11-23: 15 ug/min via INTRAVENOUS

## 2021-11-23 MED ORDER — HYDROMORPHONE HCL 1 MG/ML IJ SOLN
INTRAMUSCULAR | Status: AC
  Filled 2021-11-23: qty 1

## 2021-11-23 MED ORDER — HYDROCHLOROTHIAZIDE 12.5 MG PO TABS
12.5000 mg | ORAL_TABLET | Freq: Every day | ORAL | Status: DC
  Administered 2021-11-24 – 2021-11-27 (×4): 12.5 mg via ORAL
  Filled 2021-11-23 (×4): qty 1

## 2021-11-23 MED ORDER — BUPIVACAINE HCL (PF) 0.25 % IJ SOLN
INTRAMUSCULAR | Status: DC | PRN
  Administered 2021-11-23: 3 mL

## 2021-11-23 MED ORDER — ROCURONIUM BROMIDE 10 MG/ML (PF) SYRINGE
PREFILLED_SYRINGE | INTRAVENOUS | Status: DC | PRN
  Administered 2021-11-23: 50 mg via INTRAVENOUS
  Administered 2021-11-23: 30 mg via INTRAVENOUS
  Administered 2021-11-23 (×2): 20 mg via INTRAVENOUS

## 2021-11-23 MED ORDER — THROMBIN 5000 UNITS EX SOLR: OROMUCOSAL | Status: DC | PRN

## 2021-11-23 MED ORDER — PROPOFOL 10 MG/ML IV BOLUS
INTRAVENOUS | Status: AC
  Filled 2021-11-23: qty 20

## 2021-11-23 MED ORDER — SODIUM CHLORIDE 0.9% FLUSH: 3.0000 mL | INTRAVENOUS | Status: DC | PRN

## 2021-11-23 MED ORDER — LIDOCAINE 2% (20 MG/ML) 5 ML SYRINGE
INTRAMUSCULAR | Status: AC
  Filled 2021-11-23: qty 5

## 2021-11-23 MED ORDER — IRBESARTAN 150 MG PO TABS
150.0000 mg | ORAL_TABLET | Freq: Every day | ORAL | Status: DC
Start: 2021-11-24 — End: 2021-11-28
  Administered 2021-11-24 – 2021-11-27 (×4): 150 mg via ORAL
  Filled 2021-11-23 (×4): qty 1

## 2021-11-23 MED ORDER — FENTANYL CITRATE (PF) 250 MCG/5ML IJ SOLN
INTRAMUSCULAR | Status: AC
  Filled 2021-11-23: qty 5

## 2021-11-23 MED ORDER — ORAL CARE MOUTH RINSE: 15.0000 mL | Freq: Once | OROMUCOSAL | Status: AC

## 2021-11-23 MED ORDER — GABAPENTIN 300 MG PO CAPS
300.0000 mg | ORAL_CAPSULE | ORAL | Status: AC
  Administered 2021-11-23: 300 mg via ORAL
  Filled 2021-11-23: qty 1

## 2021-11-23 MED ORDER — PHENOL 1.4 % MT LIQD: 1.0000 | OROMUCOSAL | Status: DC | PRN

## 2021-11-23 MED ORDER — HYDROMORPHONE HCL 1 MG/ML IJ SOLN
0.2500 mg | INTRAMUSCULAR | Status: DC | PRN
  Administered 2021-11-23: 0.25 mg via INTRAVENOUS

## 2021-11-23 MED ORDER — VITAMIN E 45 MG (100 UNIT) PO CAPS
400.0000 [IU] | ORAL_CAPSULE | Freq: Every day | ORAL | Status: DC
  Administered 2021-11-24 – 2021-11-27 (×4): 400 [IU] via ORAL
  Filled 2021-11-23 (×4): qty 4

## 2021-11-23 MED ORDER — PREGABALIN 50 MG PO CAPS
50.0000 mg | ORAL_CAPSULE | Freq: Two times a day (BID) | ORAL | Status: DC
  Administered 2021-11-23 – 2021-11-27 (×9): 50 mg via ORAL
  Filled 2021-11-23 (×9): qty 1

## 2021-11-23 MED ORDER — CELECOXIB 200 MG PO CAPS
200.0000 mg | ORAL_CAPSULE | Freq: Two times a day (BID) | ORAL | Status: DC
  Administered 2021-11-23 – 2021-11-27 (×9): 200 mg via ORAL
  Filled 2021-11-23 (×9): qty 1

## 2021-11-23 MED ORDER — CEFAZOLIN SODIUM-DEXTROSE 2-4 GM/100ML-% IV SOLN
2.0000 g | Freq: Three times a day (TID) | INTRAVENOUS | Status: AC
  Administered 2021-11-23 (×2): 2 g via INTRAVENOUS
  Filled 2021-11-23 (×2): qty 100

## 2021-11-23 MED ORDER — BUPIVACAINE HCL (PF) 0.25 % IJ SOLN
INTRAMUSCULAR | Status: AC
  Filled 2021-11-23: qty 30

## 2021-11-23 MED ORDER — ACETAMINOPHEN 10 MG/ML IV SOLN
INTRAVENOUS | Status: DC | PRN
Start: 2021-11-23 — End: 2021-11-23
  Administered 2021-11-23: 1000 mg via INTRAVENOUS

## 2021-11-23 MED ORDER — PROPOFOL 10 MG/ML IV BOLUS
INTRAVENOUS | Status: DC | PRN
  Administered 2021-11-23: 120 mg via INTRAVENOUS

## 2021-11-23 MED ORDER — ONDANSETRON HCL 4 MG/2ML IJ SOLN
INTRAMUSCULAR | Status: DC | PRN
  Administered 2021-11-23: 4 mg via INTRAVENOUS

## 2021-11-23 MED ORDER — DEXAMETHASONE 4 MG PO TABS
4.0000 mg | ORAL_TABLET | Freq: Four times a day (QID) | ORAL | Status: DC
  Administered 2021-11-23 – 2021-11-24 (×4): 4 mg via ORAL
  Filled 2021-11-23 (×4): qty 1

## 2021-11-23 MED ORDER — ONDANSETRON HCL 4 MG/2ML IJ SOLN: 4.0000 mg | Freq: Once | INTRAMUSCULAR | Status: DC | PRN

## 2021-11-23 MED ORDER — ADULT MULTIVITAMIN W/MINERALS CH
1.0000 | ORAL_TABLET | Freq: Every day | ORAL | Status: DC
  Administered 2021-11-24 – 2021-11-27 (×4): 1 via ORAL
  Filled 2021-11-23 (×4): qty 1

## 2021-11-23 MED ORDER — ROCURONIUM BROMIDE 10 MG/ML (PF) SYRINGE
PREFILLED_SYRINGE | INTRAVENOUS | Status: AC
  Filled 2021-11-23: qty 20

## 2021-11-23 MED ORDER — DEXAMETHASONE SODIUM PHOSPHATE 4 MG/ML IJ SOLN: 4.0000 mg | Freq: Four times a day (QID) | INTRAMUSCULAR | Status: DC

## 2021-11-23 MED ORDER — PHENYLEPHRINE 80 MCG/ML (10ML) SYRINGE FOR IV PUSH (FOR BLOOD PRESSURE SUPPORT)
PREFILLED_SYRINGE | INTRAVENOUS | Status: AC
  Filled 2021-11-23: qty 10

## 2021-11-23 MED ORDER — ONDANSETRON HCL 4 MG/2ML IJ SOLN: 4.0000 mg | Freq: Four times a day (QID) | INTRAMUSCULAR | Status: DC | PRN

## 2021-11-23 MED ORDER — THROMBIN 5000 UNITS EX SOLR
CUTANEOUS | Status: AC
  Filled 2021-11-23: qty 5000

## 2021-11-23 MED ORDER — ACETAMINOPHEN 500 MG PO TABS
1000.0000 mg | ORAL_TABLET | ORAL | Status: DC
  Filled 2021-11-23: qty 2

## 2021-11-23 MED ORDER — LACTATED RINGERS IV SOLN: INTRAVENOUS | Status: DC

## 2021-11-23 MED ORDER — ALBUMIN HUMAN 5 % IV SOLN: INTRAVENOUS | Status: DC | PRN

## 2021-11-23 MED ORDER — ONDANSETRON HCL 4 MG PO TABS: 4.0000 mg | ORAL_TABLET | Freq: Four times a day (QID) | ORAL | Status: DC | PRN

## 2021-11-23 MED ORDER — DEXAMETHASONE SODIUM PHOSPHATE 10 MG/ML IJ SOLN
INTRAMUSCULAR | Status: AC
  Filled 2021-11-23: qty 1

## 2021-11-23 MED ORDER — OXYCODONE HCL 5 MG PO TABS
5.0000 mg | ORAL_TABLET | ORAL | Status: DC | PRN
  Administered 2021-11-23 – 2021-11-28 (×12): 5 mg via ORAL
  Filled 2021-11-23 (×12): qty 1

## 2021-11-23 MED ORDER — SUGAMMADEX SODIUM 200 MG/2ML IV SOLN
INTRAVENOUS | Status: DC | PRN
  Administered 2021-11-23: 200 mg via INTRAVENOUS

## 2021-11-23 MED ORDER — SODIUM CHLORIDE 0.9% FLUSH
3.0000 mL | Freq: Two times a day (BID) | INTRAVENOUS | Status: DC
  Administered 2021-11-23 – 2021-11-27 (×8): 3 mL via INTRAVENOUS

## 2021-11-23 MED ORDER — MENTHOL 3 MG MT LOZG: 1.0000 | LOZENGE | OROMUCOSAL | Status: DC | PRN

## 2021-11-23 MED ORDER — SENNA 8.6 MG PO TABS
1.0000 | ORAL_TABLET | Freq: Two times a day (BID) | ORAL | Status: DC
  Administered 2021-11-23 – 2021-11-27 (×9): 8.6 mg via ORAL
  Filled 2021-11-23 (×9): qty 1

## 2021-11-23 MED ORDER — SODIUM CHLORIDE 0.9 % IV SOLN
250.0000 mL | INTRAVENOUS | Status: DC
  Administered 2021-11-23: 250 mL via INTRAVENOUS

## 2021-11-23 MED ORDER — LIDOCAINE 2% (20 MG/ML) 5 ML SYRINGE
INTRAMUSCULAR | Status: DC | PRN
  Administered 2021-11-23: 60 mg via INTRAVENOUS

## 2021-11-23 MED ORDER — THROMBIN 20000 UNITS EX SOLR
CUTANEOUS | Status: AC
  Filled 2021-11-23: qty 20000

## 2021-11-23 MED ORDER — AMISULPRIDE (ANTIEMETIC) 5 MG/2ML IV SOLN: 10.0000 mg | Freq: Once | INTRAVENOUS | Status: DC | PRN

## 2021-11-23 MED ORDER — IRBESARTAN-HYDROCHLOROTHIAZIDE 150-12.5 MG PO TABS: 1.0000 | ORAL_TABLET | Freq: Every morning | ORAL | Status: DC

## 2021-11-23 MED ORDER — CEFAZOLIN SODIUM-DEXTROSE 2-4 GM/100ML-% IV SOLN
2.0000 g | INTRAVENOUS | Status: AC
  Administered 2021-11-23: 2 g via INTRAVENOUS
  Filled 2021-11-23: qty 100

## 2021-11-23 MED ORDER — DEXAMETHASONE SODIUM PHOSPHATE 10 MG/ML IJ SOLN
INTRAMUSCULAR | Status: DC | PRN
  Administered 2021-11-23: 5 mg via INTRAVENOUS

## 2021-11-23 MED ORDER — THROMBIN 20000 UNITS EX SOLR: CUTANEOUS | Status: DC | PRN

## 2021-11-23 MED ORDER — METHOCARBAMOL 500 MG PO TABS
500.0000 mg | ORAL_TABLET | Freq: Four times a day (QID) | ORAL | Status: DC | PRN
  Administered 2021-11-23 – 2021-11-28 (×4): 500 mg via ORAL
  Filled 2021-11-23 (×4): qty 1

## 2021-11-23 MED ORDER — 0.9 % SODIUM CHLORIDE (POUR BTL) OPTIME
TOPICAL | Status: DC | PRN
  Administered 2021-11-23: 1000 mL

## 2021-11-23 MED ORDER — CHLORHEXIDINE GLUCONATE 0.12 % MT SOLN
15.0000 mL | Freq: Once | OROMUCOSAL | Status: AC
  Administered 2021-11-23: 15 mL via OROMUCOSAL
  Filled 2021-11-23: qty 15

## 2021-11-23 MED ORDER — ONDANSETRON HCL 4 MG/2ML IJ SOLN
INTRAMUSCULAR | Status: AC
  Filled 2021-11-23: qty 2

## 2021-11-23 SURGICAL SUPPLY — 43 items
BAG COUNTER SPONGE SURGICOUNT (BAG) ×2 IMPLANT
BAND RUBBER #18 3X1/16 STRL (MISCELLANEOUS) ×2 IMPLANT
BENZOIN TINCTURE PRP APPL 2/3 (GAUZE/BANDAGES/DRESSINGS) ×2 IMPLANT
BUR CARBIDE MATCH 3.0 (BURR) ×2 IMPLANT
CANISTER SUCT 3000ML PPV (MISCELLANEOUS) ×2 IMPLANT
DERMABOND ADVANCED (GAUZE/BANDAGES/DRESSINGS) ×1
DERMABOND ADVANCED .7 DNX12 (GAUZE/BANDAGES/DRESSINGS) IMPLANT
DRAPE LAPAROTOMY 100X72X124 (DRAPES) ×2 IMPLANT
DRAPE MICROSCOPE LEICA (MISCELLANEOUS) ×3 IMPLANT
DRAPE SURG 17X23 STRL (DRAPES) ×1 IMPLANT
DRSG OPSITE POSTOP 4X6 (GAUZE/BANDAGES/DRESSINGS) ×1 IMPLANT
DURAPREP 26ML APPLICATOR (WOUND CARE) ×2 IMPLANT
ELECT REM PT RETURN 9FT ADLT (ELECTROSURGICAL) ×2
ELECTRODE REM PT RTRN 9FT ADLT (ELECTROSURGICAL) ×1 IMPLANT
GAUZE 4X4 16PLY ~~LOC~~+RFID DBL (SPONGE) IMPLANT
GLOVE BIO SURGEON STRL SZ7 (GLOVE) ×1 IMPLANT
GLOVE BIO SURGEON STRL SZ8 (GLOVE) ×2 IMPLANT
GLOVE BIOGEL PI IND STRL 7.0 (GLOVE) IMPLANT
GLOVE BIOGEL PI INDICATOR 7.0 (GLOVE) ×1
GOWN STRL REUS W/ TWL LRG LVL3 (GOWN DISPOSABLE) IMPLANT
GOWN STRL REUS W/ TWL XL LVL3 (GOWN DISPOSABLE) ×1 IMPLANT
GOWN STRL REUS W/TWL 2XL LVL3 (GOWN DISPOSABLE) IMPLANT
GOWN STRL REUS W/TWL LRG LVL3 (GOWN DISPOSABLE) ×6
GOWN STRL REUS W/TWL XL LVL3 (GOWN DISPOSABLE) ×6
HEMOSTAT POWDER KIT SURGIFOAM (HEMOSTASIS) ×2 IMPLANT
KIT BASIN OR (CUSTOM PROCEDURE TRAY) ×2 IMPLANT
KIT TURNOVER KIT B (KITS) ×2 IMPLANT
NDL HYPO 25X1 1.5 SAFETY (NEEDLE) ×1 IMPLANT
NDL SPNL 20GX3.5 QUINCKE YW (NEEDLE) IMPLANT
NEEDLE HYPO 25X1 1.5 SAFETY (NEEDLE) ×2 IMPLANT
NEEDLE SPNL 20GX3.5 QUINCKE YW (NEEDLE) IMPLANT
NS IRRIG 1000ML POUR BTL (IV SOLUTION) ×2 IMPLANT
PACK LAMINECTOMY NEURO (CUSTOM PROCEDURE TRAY) ×2 IMPLANT
PAD ARMBOARD 7.5X6 YLW CONV (MISCELLANEOUS) ×6 IMPLANT
SPONGE SURGIFOAM ABS GEL 100 (HEMOSTASIS) ×1 IMPLANT
STRIP CLOSURE SKIN 1/2X4 (GAUZE/BANDAGES/DRESSINGS) ×2 IMPLANT
SUT VIC AB 0 CT1 18XCR BRD8 (SUTURE) ×1 IMPLANT
SUT VIC AB 0 CT1 8-18 (SUTURE) ×2
SUT VIC AB 2-0 CP2 18 (SUTURE) ×2 IMPLANT
SUT VIC AB 3-0 SH 8-18 (SUTURE) ×3 IMPLANT
TOWEL GREEN STERILE (TOWEL DISPOSABLE) ×2 IMPLANT
TOWEL GREEN STERILE FF (TOWEL DISPOSABLE) ×2 IMPLANT
WATER STERILE IRR 1000ML POUR (IV SOLUTION) ×2 IMPLANT

## 2021-11-23 NOTE — Anesthesia Procedure Notes (Signed)
Procedure Name: Intubation ?Date/Time: 11/23/2021 7:39 AM ?Performed by: Glynda Jaeger, CRNA ?Pre-anesthesia Checklist: Patient identified, Patient being monitored, Timeout performed, Emergency Drugs available and Suction available ?Patient Re-evaluated:Patient Re-evaluated prior to induction ?Oxygen Delivery Method: Circle System Utilized ?Preoxygenation: Pre-oxygenation with 100% oxygen ?Induction Type: IV induction ?Ventilation: Mask ventilation without difficulty ?Laryngoscope Size: Mac and 4 ?Grade View: Grade I ?Tube type: Oral ?Tube size: 7.5 mm ?Number of attempts: 1 ?Airway Equipment and Method: Stylet ?Placement Confirmation: ETT inserted through vocal cords under direct vision, positive ETCO2 and breath sounds checked- equal and bilateral ?Secured at: 23 cm ?Tube secured with: Tape ?Dental Injury: Teeth and Oropharynx as per pre-operative assessment  ? ? ? ? ?

## 2021-11-23 NOTE — Anesthesia Postprocedure Evaluation (Signed)
Anesthesia Post Note ? ?Patient: Jorge Ross ? ?Procedure(s) Performed: Laminectomy and Foraminotomy - Lumbar two-three, Lumbar three-four, Lumbar four-five bilateral (Bilateral: Back) ? ?  ? ?Patient location during evaluation: PACU ?Anesthesia Type: General ?Level of consciousness: awake and alert and oriented ?Pain management: pain level controlled ?Vital Signs Assessment: post-procedure vital signs reviewed and stable ?Respiratory status: spontaneous breathing, nonlabored ventilation and respiratory function stable ?Cardiovascular status: blood pressure returned to baseline and stable ?Postop Assessment: no apparent nausea or vomiting ?Anesthetic complications: no ? ? ?No notable events documented. ? ?Last Vitals:  ?Vitals:  ? 11/23/21 1030 11/23/21 1045  ?BP: (!) 151/78 (!) 168/86  ?Pulse: 86 60  ?Resp: 14 15  ?Temp:    ?SpO2: 97% 96%  ?  ?Last Pain:  ?Vitals:  ? 11/23/21 1045  ?TempSrc:   ?PainSc: 2   ? ? ?  ?  ?  ?  ?  ?  ? ?Benigno Check A. ? ? ? ? ?

## 2021-11-23 NOTE — Transfer of Care (Signed)
Immediate Anesthesia Transfer of Care Note ? ?Patient: Jorge Ross ? ?Procedure(s) Performed: Laminectomy and Foraminotomy - Lumbar two-three, Lumbar three-four, Lumbar four-five bilateral (Bilateral: Back) ? ?Patient Location: PACU ? ?Anesthesia Type:General ? ?Level of Consciousness: awake, alert , oriented, patient cooperative and responds to stimulation ? ?Airway & Oxygen Therapy: Patient Spontanous Breathing and Patient connected to face mask oxygen ? ?Post-op Assessment: Report given to RN and Post -op Vital signs reviewed and stable ? ?Post vital signs: Reviewed and stable ? ?Last Vitals:  ?Vitals Value Taken Time  ?BP 152/68 11/23/21 1059  ?Temp 36.8 ?C 11/23/21 1015  ?Pulse 63 11/23/21 1109  ?Resp 13 11/23/21 1109  ?SpO2 93 % 11/23/21 1109  ?Vitals shown include unvalidated device data. ? ?Last Pain:  ?Vitals:  ? 11/23/21 1045  ?TempSrc:   ?PainSc: 2   ?   ? ?Patients Stated Pain Goal: 2 (11/23/21 6269) ? ?Complications: No notable events documented. ?

## 2021-11-23 NOTE — Op Note (Signed)
11/23/2021 ? ?10:02 AM ? ?PATIENT:  Jorge Ross  81 y.o. male ? 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 ?POST-OPERATIVE DIAGNOSIS:  same ? ?PROCEDURE: Decompressive lumbar laminectomy, medial facetectomy and foraminotomies L2-3 L3-4 L4-5 ? ?SURGEON:  Sherley Bounds, MD ? ?ASSISTANTS: Glenford Peers FNP ? ?ANESTHESIA:   General ? ?EBL: 100 ml ? ?Total I/O ?In: 950 [I.V.:600; IV Piggyback:350] ?Out: 100 [Blood:100] ? ?BLOOD ADMINISTERED: none ? ?DRAINS: None ? ?SPECIMEN:  none ? ?INDICATION FOR PROCEDURE: This patient presented with back pain with bilateral leg pain consistent with claudication. Imaging showed very spinal stenosis L2-3 L3-4 L4-5. The patient tried conservative measures without relief. Pain was debilitating. Recommended decompressive laminectomy L2-3 L3-4 L4-5. Patient understood the risks, benefits, and alternatives and potential outcomes and wished to proceed. ? ?PROCEDURE DETAILS: The patient was taken to the operating room and after induction of adequate generalized endotracheal anesthesia, the patient was rolled into the prone position on chest rolls and all pressure points were padded. The lumbar region was cleaned and then prepped with DuraPrep and draped in the usual sterile fashion. 5 cc of local anesthesia was injected and then a dorsal midline incision was made and carried down to the lumbo sacral fascia. The fascia was opened and the paraspinous musculature was taken down in a subperiosteal fashion to expose to L2-3 L3-4 and L4-5 bilaterally. Intraoperative x-ray confirmed my level, and then I moved the spinous processes of L3 and L4 and the bottom half of the L2 spinous process and used a combination of the high-speed drill and the Kerrison punches to perform a hemilaminectomy, medial facetectomy, and foraminotomy at L2-3 L3-4 and L4-5 bilaterally. The underlying yellow ligament was opened and removed in a piecemeal  fashion to expose the underlying dura and exiting nerve root. I undercut the lateral recess at each level and dissected down until I was medial to and distal to the pedicle at each level. The nerve root was well decompressed at each of the 3 levels. I then palpated with a coronary dilator along the nerve root and into the foramen to assure adequate decompression. I felt no more compression of the nerve roots and the central canal was obviously well decompressed. I irrigated with saline solution. Achieved hemostasis with bipolar cautery, lined the dura with Gelfoam, and then closed the fascia with 0 Vicryl. I closed the subcutaneous tissues with 2-0 Vicryl and the subcuticular tissues with 3-0 Vicryl. The skin was then closed with benzoin and Steri-Strips. The drapes were removed, a sterile dressing was applied.  My nurse practitioner was involved in the exposure, the laminectomy and medial facetectomy, and the closure. the patient was awakened from general anesthesia and transferred to the recovery room in stable condition. At the end of the procedure all sponge, needle and instrument counts were correct. ? ? ? ?PLAN OF CARE: Admit for overnight observation ? ?PATIENT DISPOSITION:  PACU - hemodynamically stable. ?  ?Delay start of Pharmacological VTE agent (>24hrs) due to surgical blood loss or risk of bleeding:  yes ? ? ? ? ? ? ? ? ? ? ? ? ? ? ?
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
## 2021-11-24 ENCOUNTER — Encounter (HOSPITAL_COMMUNITY): Payer: Self-pay | Admitting: Neurological Surgery

## 2021-11-24 DIAGNOSIS — I739 Peripheral vascular disease, unspecified: Secondary | ICD-10-CM | POA: Diagnosis present

## 2021-11-24 DIAGNOSIS — Z8042 Family history of malignant neoplasm of prostate: Secondary | ICD-10-CM | POA: Diagnosis not present

## 2021-11-24 DIAGNOSIS — Z801 Family history of malignant neoplasm of trachea, bronchus and lung: Secondary | ICD-10-CM | POA: Diagnosis not present

## 2021-11-24 DIAGNOSIS — Z885 Allergy status to narcotic agent status: Secondary | ICD-10-CM | POA: Diagnosis not present

## 2021-11-24 DIAGNOSIS — Z905 Acquired absence of kidney: Secondary | ICD-10-CM | POA: Diagnosis not present

## 2021-11-24 DIAGNOSIS — I10 Essential (primary) hypertension: Secondary | ICD-10-CM | POA: Diagnosis present

## 2021-11-24 DIAGNOSIS — K5903 Drug induced constipation: Secondary | ICD-10-CM | POA: Diagnosis not present

## 2021-11-24 DIAGNOSIS — R7303 Prediabetes: Secondary | ICD-10-CM | POA: Diagnosis present

## 2021-11-24 DIAGNOSIS — M1990 Unspecified osteoarthritis, unspecified site: Secondary | ICD-10-CM | POA: Diagnosis present

## 2021-11-24 DIAGNOSIS — Z950 Presence of cardiac pacemaker: Secondary | ICD-10-CM | POA: Diagnosis not present

## 2021-11-24 DIAGNOSIS — Z85038 Personal history of other malignant neoplasm of large intestine: Secondary | ICD-10-CM | POA: Diagnosis not present

## 2021-11-24 DIAGNOSIS — Z85528 Personal history of other malignant neoplasm of kidney: Secondary | ICD-10-CM | POA: Diagnosis not present

## 2021-11-24 DIAGNOSIS — I252 Old myocardial infarction: Secondary | ICD-10-CM | POA: Diagnosis not present

## 2021-11-24 DIAGNOSIS — M48062 Spinal stenosis, lumbar region with neurogenic claudication: Secondary | ICD-10-CM | POA: Diagnosis present

## 2021-11-24 MED ORDER — DEXAMETHASONE SODIUM PHOSPHATE 4 MG/ML IJ SOLN
4.0000 mg | Freq: Three times a day (TID) | INTRAMUSCULAR | Status: DC
  Filled 2021-11-24: qty 1

## 2021-11-24 MED ORDER — POLYVINYL ALCOHOL 1.4 % OP SOLN
1.0000 [drp] | OPHTHALMIC | Status: DC | PRN
  Filled 2021-11-24: qty 15

## 2021-11-24 MED ORDER — DEXAMETHASONE 4 MG PO TABS
4.0000 mg | ORAL_TABLET | Freq: Three times a day (TID) | ORAL | Status: DC
Start: 2021-11-24 — End: 2021-11-25
  Administered 2021-11-24 – 2021-11-25 (×3): 4 mg via ORAL
  Filled 2021-11-24 (×3): qty 1

## 2021-11-24 NOTE — Progress Notes (Signed)
NEUROSURGERY PROGRESS NOTE ? ?Doing well. Complains of appropriate back soreness. ?No leg pain ?No numbness, tingling or weakness ?Ambulating and voiding well ?Good strength and sensation ?Incision CDI ? ?Temp:  [97.7 ?F (36.5 ?C)-98.5 ?F (36.9 ?C)] 97.7 ?F (36.5 ?C) (04/29 0329) ?Pulse Rate:  [60-90] 60 (04/29 0329) ?Resp:  [12-18] 16 (04/29 0329) ?BP: (102-168)/(62-86) 102/62 (04/29 0329) ?SpO2:  [92 %-98 %] 95 % (04/29 0329) ? ?Plan: ?Postop day one 3 level lumbar laminectomy. Doing well. Patient needs to ambulate today in the hallway 3 times a day. Planning on rehab placement for now because he is the caregiver for his wife. Pain control and therapy today. ? ?Eleonore Chiquito, NP ?11/24/2021 ?8:48 AM ?  ?

## 2021-11-24 NOTE — Plan of Care (Signed)

## 2021-11-24 NOTE — Progress Notes (Signed)
?   11/24/21 1740  ?Pressure Injury Prevention  ?Positioning Frequency Able to turn self  ?Mobility  ?Activity Ambulated with assistance in room;Ambulated with assistance to bathroom  ?Range of Motion/Exercises Active;All extremities  ?Level of Assistance Minimal assist, patient does 75% or more  ?Assistive Device Front wheel walker  ?Distance Ambulated (ft) 80 ft  ?Activity Response Tolerated fair  ?Hygiene  ?Oral Care Teeth brushed  ?Hygiene Peri care  ?Level of Assistance Independent  ? ? ?Patient able to ambulate in room with 1 assistance w/ walker, no c/o sob or dizziness, declined to sit in chair for breakfast at this time, pt back to bed, dressing remains CDI. ?

## 2021-11-24 NOTE — Evaluation (Signed)
Occupational Therapy Evaluation ?Patient Details ?Name: Jorge Ross ?MRN: [id number] ?DOB: 1940-11-08 ?Today's Date: 11/24/2021 ? ? ?History of Present Illness Pt is a 81 y.o. male s/p lumbar laminectomy L2-5. PMH: CKD, OA, HTN, MI, c spine sx, pacemaker  ? ?Clinical Impression ?  ?Pt admitted for concerns listed above. PTA pt reported that he was independent with all ADL's and IADL's, including caring for his wife. At this time, pt is limited by weakness, pain, and balance deficits, as well as poor activity tolerance. Overall he is requiring min-mod A for all ADL's and the use of a RW, which he was not using before. Recommending SNF level therapies to maximize pts independence and safety. OT will follow acutely.   ?   ? ?Recommendations for follow up therapy are one component of a multi-disciplinary discharge planning process, led by the attending physician.  Recommendations may be updated based on patient status, additional functional criteria and insurance authorization.  ? ?Follow Up Recommendations ? Skilled nursing-short term rehab (<3 hours/day)  ?  ?Assistance Recommended at Discharge Frequent or constant Supervision/Assistance  ?Patient can return home with the following A little help with walking and/or transfers;A little help with bathing/dressing/bathroom;Assistance with cooking/housework;Help with stairs or ramp for entrance ? ?  ?Functional Status Assessment ? Patient has had a recent decline in their functional status and demonstrates the ability to make significant improvements in function in a reasonable and predictable amount of time.  ?Equipment Recommendations ? None recommended by OT  ?  ?Recommendations for Other Services   ? ? ?  ?Precautions / Restrictions Precautions ?Precautions: Back ?Precaution Booklet Issued: Yes (comment) ?Precaution Comments: Educated on 3/3 back precautions. Handout provided. ?Restrictions ?Weight Bearing Restrictions: No  ? ?  ? ?Mobility Bed Mobility ?Overal bed  mobility: Needs Assistance ?Bed Mobility: Rolling, Sidelying to Sit, Sit to Sidelying ?Rolling: Min assist ?Sidelying to sit: Mod assist, HOB elevated ?  ?  ?Sit to sidelying: Mod assist ?General bed mobility comments: +rail, increased time, cues for sequencing, assist with BLE and trunk ?  ? ?Transfers ?Overall transfer level: Needs assistance ?Equipment used: Rolling walker (2 wheels) ?Transfers: Sit to/from Stand ?Sit to Stand: Mod assist ?  ?  ?  ?  ?  ?General transfer comment: cues for hand placement and sequencing, assist to power up/control descent and stabilize balance ?  ? ?  ?Balance Overall balance assessment: Needs assistance ?Sitting-balance support: Feet supported, No upper extremity supported ?Sitting balance-Leahy Scale: Fair ?  ?  ?Standing balance support: Bilateral upper extremity supported, Reliant on assistive device for balance, During functional activity ?Standing balance-Leahy Scale: Poor ?  ?  ?  ?  ?  ?  ?  ?  ?  ?  ?  ?  ?   ? ?ADL either performed or assessed with clinical judgement  ? ?ADL Overall ADL's : Needs assistance/impaired ?Eating/Feeding: Set up;Sitting ?  ?Grooming: Set up;Sitting ?  ?Upper Body Bathing: Minimal assistance;Sitting ?  ?Lower Body Bathing: Moderate assistance;Sitting/lateral leans;Sit to/from stand ?  ?Upper Body Dressing : Minimal assistance;Sitting ?  ?Lower Body Dressing: Moderate assistance;Sitting/lateral leans;Sit to/from stand ?  ?Toilet Transfer: Moderate assistance;Ambulation ?  ?Toileting- Clothing Manipulation and Hygiene: Moderate assistance;Sitting/lateral lean;Sit to/from stand ?  ?  ?  ?Functional mobility during ADLs: Moderate assistance;Rolling walker (2 wheels) ?General ADL Comments: Increased assist due to weakness and pain  ? ? ? ?Vision Baseline Vision/History: 1 Wears glasses ?Ability to See in Adequate Light: 0 Adequate ?Patient Visual Report:  No change from baseline ?Vision Assessment?: No apparent visual deficits  ?   ?Perception   ?   ?Praxis   ?  ? ?Pertinent Vitals/Pain Pain Assessment ?Pain Assessment: 0-10 ?Pain Score: 4  ?Pain Location: Back ?Pain Descriptors / Indicators: Grimacing, Operative site guarding, Discomfort ?Pain Intervention(s): Monitored during session, Repositioned  ? ? ? ?Hand Dominance Right ?  ?Extremity/Trunk Assessment Upper Extremity Assessment ?Upper Extremity Assessment: Overall WFL for tasks assessed ?  ?Lower Extremity Assessment ?Lower Extremity Assessment: Defer to PT evaluation ?  ?Cervical / Trunk Assessment ?Cervical / Trunk Assessment: Back Surgery ?  ?Communication Communication ?Communication: No difficulties ?  ?Cognition Arousal/Alertness: Awake/alert ?Behavior During Therapy: [employer] [facility] for tasks assessed/performed ?Overall Cognitive Status: Within Functional Limits for tasks assessed ?  ?  ?  ?  ?  ?  ?  ?  ?  ?  ?  ?  ?  ?  ?  ?  ?  ?  ?  ?General Comments  VSS on RA, friend in room ? ?  ?Exercises   ?  ?Shoulder Instructions    ? ? ?Home Living Family/patient expects to be discharged to:: Private residence ?Living Arrangements: Spouse/significant other ?Available Help at Discharge: Family;Available 24 hours/day ?Type of Home: House ?Home Access: Stairs to enter ?Entrance Stairs-Number of Steps: 4 ?Entrance Stairs-Rails: Right ?Home Layout: One level ?  ?  ?Bathroom Shower/Tub: Tub/shower unit ?  ?Bathroom Toilet: Standard ?Bathroom Accessibility: Yes ?How Accessible: Accessible via wheelchair ?Home Equipment: Conservation officer, nature (2 wheels);Cane - single point;Shower seat ?  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 ?  ? ?  ?Prior Functioning/Environment Prior Level of Function : Independent/Modified Independent;Driving ?  ?  ?  ?  ?  ?  ?  ?  ?  ? ?  ?  ?OT Problem List: Decreased strength;Decreased activity tolerance;Impaired balance (sitting and/or standing);Decreased knowledge of use of DME or AE;Pain ?  ?   ?OT Treatment/Interventions:  Self-care/ADL training;Therapeutic exercise;Energy conservation;DME and/or AE instruction;Therapeutic activities;Balance training;Patient/family education  ?  ?OT Goals(Current goals can be found in the care plan section) Acute Rehab OT Goals ?Patient Stated Goal: To get better to be able to take care of his wife ?OT Goal Formulation: With patient ?Time For Goal Achievement: 12/08/21 ?Potential to Achieve Goals: Good ?ADL Goals ?Pt Will Perform Lower Body Bathing: with modified independence;sitting/lateral leans;with adaptive equipment;sit to/from stand ?Pt Will Perform Lower Body Dressing: with modified independence;with adaptive equipment;sitting/lateral leans;sit to/from stand ?Pt Will Transfer to Toilet: with modified independence;ambulating ?Pt Will Perform Toileting - Clothing Manipulation and hygiene: with modified independence;sitting/lateral leans;sit to/from stand  ?OT Frequency: Min 2X/week ?  ? ?Co-evaluation   ?  ?  ?  ?  ? ?  ?AM-PAC OT "6 Clicks" Daily Activity     ?Outcome Measure Help from another person eating meals?: A Little ?Help from another person taking care of personal grooming?: A Little ?Help from another person toileting, which includes using toliet, bedpan, or urinal?: A Lot ?Help from another person bathing (including washing, rinsing, drying)?: A Lot ?Help from another person to put on and taking off regular upper body clothing?: A Little ?Help from another person to put on and taking off regular lower body clothing?: A Lot ?6 Click Score: 15 ?  ?End of Session Equipment Utilized During Treatment: Rolling walker (2 wheels) ?Nurse Communication: Mobility status ? ?Activity Tolerance: Patient tolerated treatment well ?Patient left: in bed;with  call bell/phone within reach;with bed alarm set;with family/visitor present ? ?OT Visit Diagnosis: Unsteadiness on feet (R26.81);Other abnormalities of gait and mobility (R26.89);Muscle weakness (generalized) (M62.81)  ?              ?Time:  6168-3729 ?OT Time Calculation (min): 24 min ?Charges:  OT General Charges ?$OT Visit: 1 Visit ?OT Evaluation ?$OT Eval Moderate Complexity: 1 Mod ?OT Treatments ?$Self Care/Home Management : 8-22 mins ? ?Elcie Pelster

## 2021-11-24 NOTE — Care Management Obs Status (Signed)
MEDICARE OBSERVATION STATUS NOTIFICATION ? ? ?Patient Details  ?Name: Jorge Ross ?MRN: [id number] ?Date of Birth: [birth year] ? ? ?Medicare Observation Status Notification Given:  Yes ? ? ? ?Bartholomew Crews, RN ?11/24/2021, 2:34 PM ?

## 2021-11-24 NOTE — Evaluation (Signed)
Physical Therapy Evaluation ?Patient Details ?Name: Jorge Ross ?MRN: [id number] ?DOB: January 03, 1941 ?Today's Date: 11/24/2021 ? ?History of Present Illness ? Pt is a 81 y.o. male s/p lumbar laminectomy L2-5. PMH: CKD, OA, HTN, MI, c spine sx, pacemaker  ?Clinical Impression ? Patient is s/p above surgery resulting in the deficits listed below (see PT Problem List). PTA pt independent with mobility and ADLs, driving. He is the primary caregiver for his wife, who is primarily wheelchair dependent. On eval, he required mod assist bed mobility, mod assist transfers, and min assist ambulation 25' with RW. Pt educated on 3/3 back precautions. Patient will benefit from skilled PT to increase their independence and safety with mobility (while adhering to their precautions) to allow discharge to the venue listed below. ?   ?   ? ?Recommendations for follow up therapy are one component of a multi-disciplinary discharge planning process, led by the attending physician.  Recommendations may be updated based on patient status, additional functional criteria and insurance authorization. ? ?Follow Up Recommendations Skilled nursing-short term rehab (<3 hours/day) ? ?  ?Assistance Recommended at Discharge Frequent or constant Supervision/Assistance  ?Patient can return home with the following ? A little help with walking and/or transfers;Assistance with cooking/housework;Assist for transportation;A lot of help with bathing/dressing/bathroom;Help with stairs or ramp for entrance ? ?  ?Equipment Recommendations BSC/3in1  ?Recommendations for Other Services ?    ?  ?Functional Status Assessment Patient has had a recent decline in their functional status and demonstrates the ability to make significant improvements in function in a reasonable and predictable amount of time.  ? ?  ?Precautions / Restrictions Precautions ?Precautions: Back ?Precaution Comments: Educated on 3/3 back precautions. Handout provided. ?Restrictions ?Other  Position/Activity Restrictions: no brace  ? ?  ? ?Mobility ? Bed Mobility ?Overal bed mobility: Needs Assistance ?Bed Mobility: Rolling, Sidelying to Sit, Sit to Sidelying ?Rolling: Min assist ?Sidelying to sit: Mod assist, HOB elevated ?  ?  ?Sit to sidelying: Mod assist ?General bed mobility comments: +rail, increased time, cues for sequencing, assist with BLE and trunk ?  ? ?Transfers ?Overall transfer level: Needs assistance ?Equipment used: Rolling walker (2 wheels) ?Transfers: Sit to/from Stand ?Sit to Stand: Mod assist ?  ?  ?  ?  ?  ?General transfer comment: cues for hand placement and sequencing, assist to power up/control descent and stabilize balance ?  ? ?Ambulation/Gait ?Ambulation/Gait assistance: Min assist ?Gait Distance (Feet): 25 Feet ?Assistive device: Rolling walker (2 wheels) ?Gait Pattern/deviations: Step-through pattern, Decreased stride length, Trunk flexed ?Gait velocity: decreased ?Gait velocity interpretation: <1.8 ft/sec, indicate of risk for recurrent falls ?  ?General Gait Details: cues for posture, distance limited by pain and LE weakness ? ?Stairs ?  ?  ?  ?  ?  ? ?Wheelchair Mobility ?  ? ?Modified Rankin (Stroke Patients Only) ?  ? ?  ? ?Balance Overall balance assessment: Needs assistance ?Sitting-balance support: Feet supported, No upper extremity supported ?Sitting balance-Leahy Scale: Fair ?  ?  ?Standing balance support: Bilateral upper extremity supported, Reliant on assistive device for balance, During functional activity ?Standing balance-Leahy Scale: Poor ?  ?  ?  ?  ?  ?  ?  ?  ?  ?  ?  ?  ?   ? ? ? ?Pertinent Vitals/Pain Pain Assessment ?Pain Assessment: 0-10 ?Pain Score: 6  ?Pain Descriptors / Indicators: Grimacing, Operative site guarding, Discomfort ?Pain Intervention(s): Limited activity within patient's tolerance, Monitored during session, Repositioned, Premedicated before session  ? ? ?  Home Living Family/patient expects to be discharged to:: Private  residence ?Living Arrangements: Spouse/significant other ?Available Help at Discharge: Family;Available 24 hours/day ?Type of Home: House ?Home Access: Stairs to enter ?Entrance Stairs-Rails: Right ?Entrance Stairs-Number of Steps: 4 ?  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  ?  ?Prior Function Prior Level of Function : Independent/Modified Independent;Driving ?  ?  ?  ?  ?  ?  ?  ?  ?  ? ? ?Hand Dominance  ? Dominant Hand: Right ? ?  ?Extremity/Trunk Assessment  ? Upper Extremity Assessment ?Upper Extremity Assessment: Defer to OT evaluation ?  ? ?Lower Extremity Assessment ?Lower Extremity Assessment: Generalized weakness ?  ? ?Cervical / Trunk Assessment ?Cervical / Trunk Assessment: Back Surgery  ?Communication  ? Communication: No difficulties  ?Cognition Arousal/Alertness: Awake/alert ?Behavior During Therapy: [facility] for tasks assessed/performed ?Overall Cognitive Status: Within Functional Limits for tasks assessed ?  ?  ?  ?  ?  ?  ?  ?  ?  ?  ?  ?  ?  ?  ?  ?  ?  ?  ?  ? ?  ?General Comments General comments (skin integrity, edema, etc.): Pt reports mild dizziness from pain meds. VSS ? ?  ?Exercises    ? ?Assessment/Plan  ?  ?PT Assessment Patient needs continued PT services  ?PT Problem List Decreased strength;Decreased mobility;Decreased knowledge of precautions;Decreased activity tolerance;Pain;Decreased balance;Decreased knowledge of use of DME ? ?   ?  ?PT Treatment Interventions DME instruction;Therapeutic activities;Gait training;Therapeutic exercise;Patient/family education;Balance training;Functional mobility training;Stair training   ? ?PT Goals (Current goals can be found in the Care Plan section)  ?Acute Rehab PT Goals ?Patient Stated Goal: rehab then home ?PT Goal Formulation: With patient ?Time For Goal Achievement:  12/08/21 ?Potential to Achieve Goals: Good ? ?  ?Frequency Min 5X/week ?  ? ? ?Co-evaluation   ?  ?  ?  ?  ? ? ?  ?AM-PAC PT "6 Clicks" Mobility  ?Outcome Measure Help needed turning from your back to your side while in a flat bed without using bedrails?: A Little ?Help needed moving from lying on your back to sitting on the side of a flat bed without using bedrails?: A Lot ?Help needed moving to and from a bed to a chair (including a wheelchair)?: A Lot ?Help needed standing up from a chair using your arms (e.g., wheelchair or bedside chair)?: A Lot ?Help needed to walk in hospital room?: A Little ?Help needed climbing 3-5 steps with a railing? : Total ?6 Click Score: 13 ? ?  ?End of Session Equipment Utilized During Treatment: Gait belt ?Activity Tolerance: Patient tolerated treatment well ?Patient left: in bed;with call bell/phone within reach;with bed alarm set ?Nurse Communication: Mobility status ?PT Visit Diagnosis: Other abnormalities of gait and mobility (R26.89);Pain;Muscle weakness (generalized) (M62.81) ?  ? ?Time: 0865-7846 ?PT Time Calculation (min) (ACUTE ONLY): 26 min ? ? ?Charges:   PT Evaluation ?$PT Eval Moderate Complexity: 1 Mod ?PT Treatments ?$Gait Training: 8-22 mins ?  ?   ? ? ?Lorrin Goodell, PT  ?Office # [phone] ?Pager [phone] ? ? ?Lorriane Shire ?11/24/2021, 9:58 AM ? ?

## 2021-11-25 MED ORDER — DEXAMETHASONE SODIUM PHOSPHATE 4 MG/ML IJ SOLN: 4.0000 mg | Freq: Two times a day (BID) | INTRAMUSCULAR | Status: AC

## 2021-11-25 MED ORDER — DOCUSATE SODIUM 100 MG PO CAPS
100.0000 mg | ORAL_CAPSULE | Freq: Two times a day (BID) | ORAL | Status: DC
  Administered 2021-11-25 – 2021-11-27 (×5): 100 mg via ORAL
  Filled 2021-11-25 (×5): qty 1

## 2021-11-25 MED ORDER — DEXAMETHASONE 4 MG PO TABS
4.0000 mg | ORAL_TABLET | Freq: Two times a day (BID) | ORAL | Status: AC
  Administered 2021-11-25 – 2021-11-26 (×2): 4 mg via ORAL
  Filled 2021-11-25 (×2): qty 1

## 2021-11-25 MED ORDER — BISACODYL 5 MG PO TBEC: 5.0000 mg | DELAYED_RELEASE_TABLET | Freq: Every day | ORAL | Status: DC | PRN

## 2021-11-25 NOTE — NC FL2 (Signed)
?[place] MEDICAID FL2 LEVEL OF CARE SCREENING TOOL  ?  ? ?IDENTIFICATION  ?Patient Name: ?Jorge Ross Birthdate: 27-Apr-1941 Sex: male Admission Date (Current Location): ?11/23/2021  ?[place] and [place] Number: ? [place] ?  Facility and Address:  ?[facility] [place]. [facility], [place] [address], [place], [place] [address] ?     Provider Number: ?[id number]  ?Attending Physician Name and Address:  ?Eustace Moore, MD ? Relative Name and Phone Number:  ?  ?   ?Current Level of Care: ?Hospital Recommended Level of Care: ?[place] Prior Approval Number: ?  ? ?Date Approved/Denied: ?  PASRR Number: ?[id number] A ? ?Discharge Plan: ?SNF ?  ? ?Current Diagnoses: ?Patient Active Problem List  ? Diagnosis Date Noted  ? S/P lumbar laminectomy 11/23/2021  ? 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 History of malignant neoplasm of colon 12/12/2020  ? Hypothyroidism 12/12/2020  ? Personal history of other malignant neoplasm of kidney 12/12/2020  ? Symptomatic bradycardia 06/23/2017  ? Port catheter in place 01/02/2016  ? Colon cancer ([place]) 05/11/2014  ? 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 Occult blood in stools 02/10/2014  ? Hypertension 12/04/2010  ? ? ?Orientation RESPIRATION BLADDER Height & Weight   ?  ?Self, Time, Situation, Place ? Normal Continent Weight: 190 lb (86.2 kg) ?Height:  '5\' 11"'$  (180.3 cm)  ?BEHAVIORAL SYMPTOMS/MOOD NEUROLOGICAL BOWEL NUTRITION STATUS  ?    Continent    ?AMBULATORY STATUS COMMUNICATION OF NEEDS Skin   ?Limited Assist   Surgical wounds ?  ?  ?  ?    ?     ?     ? ? ?Personal Care Assistance Level of Assistance  ?    ?  ?  ?   ? ?Functional Limitations Info  ?Sight, Hearing, Speech Sight Info: Impaired ?Hearing Info: Adequate ?Speech Info: Adequate  ? ? ?SPECIAL CARE FACTORS  FREQUENCY  ?PT (By licensed PT), OT (By licensed OT)   ?  ?  ?  ?  ?  ?  ?   ? ? ?Contractures    ? ? ?Additional Factors Info  ?Code Status Code Status Info: FULL CODE ?  ?  ?  ?  ?   ? ?Current Medications (11/25/2021):  This is the current hospital active medication list ?Current Facility-Administered Medications  ?Medication Dose Route Frequency Provider Last Rate Last Admin  ? 0.9 %  sodium chloride infusion  250 mL Intravenous Continuous Eustace Moore, MD   Stopped at 11/23/21 1505  ? 0.9 % NaCl with KCl 20 mEq/ L  infusion   Intravenous Continuous Eustace Moore, MD   Stopped at 11/24/21 0600  ? aspirin EC tablet 81 mg  81 mg Oral Daily Eustace Moore, MD   81 mg at 11/25/21 3846  ? bisacodyl (DULCOLAX) EC tablet 5 mg  5 mg Oral Daily PRN Meyran, Ocie Cornfield, NP      ? celecoxib (CELEBREX) capsule 200 mg  200 mg Oral Q12H Eustace Moore, MD   200 mg at 11/25/21 6599  ? dexamethasone (DECADRON) injection 4 mg  4 mg Intravenous Q12H Meyran, Ocie Cornfield, NP      ? Or  ? dexamethasone (DECADRON) tablet 4 mg  4 mg Oral Q12H Meyran, Ocie Cornfield, NP      ?  docusate sodium (COLACE) capsule 100 mg  100 mg Oral BID Meyran, Ocie Cornfield, NP      ? irbesartan (AVAPRO) tablet 150 mg  150 mg Oral Daily Eustace Moore, MD   150 mg at 11/25/21 7001  ? And  ? hydrochlorothiazide (HYDRODIURIL) tablet 12.5 mg  12.5 mg Oral Daily Eustace Moore, MD   12.5 mg at 11/25/21 7494  ? menthol-cetylpyridinium (CEPACOL) lozenge 3 mg  1 lozenge Oral PRN Eustace Moore, MD      ? Or  ? phenol (CHLORASEPTIC) mouth spray 1 spray  1 spray Mouth/Throat PRN Eustace Moore, MD      ? methocarbamol (ROBAXIN) tablet 500 mg  500 mg Oral Q6H PRN Eustace Moore, MD   500 mg at 11/24/21 0351  ? Or  ? methocarbamol (ROBAXIN) 500 mg in dextrose 5 % 50 mL IVPB  500 mg Intravenous Q6H PRN Eustace Moore, MD      ? morphine (PF) 2 MG/ML injection 2 mg  2 mg Intravenous Q2H PRN Eustace Moore, MD      ? multivitamin with minerals tablet 1  tablet  1 tablet Oral Daily Eustace Moore, MD   1 tablet at 11/25/21 [phone]  ? ondansetron (ZOFRAN) tablet 4 mg  4 mg Oral Q6H PRN Eustace Moore, MD      ? Or  ? ondansetron [facility]) injection 4 mg  4 mg Intravenous Q6H PRN Eustace Moore, MD      ? oxyCODONE (Oxy IR/ROXICODONE) immediate release tablet 5 mg  5 mg Oral Q3H PRN Eustace Moore, MD   5 mg at 11/25/21 5916  ? polyvinyl alcohol (LIQUIFILM TEARS) 1.4 % ophthalmic solution 1 drop  1 drop Both Eyes PRN Meyran, Ocie Cornfield, NP      ? pregabalin (LYRICA) capsule 50 mg  50 mg Oral BID Eustace Moore, MD   50 mg at 11/25/21 3846  ? senna (SENOKOT) tablet 8.6 mg  1 tablet Oral BID Eustace Moore, MD   8.6 mg at 11/25/21 6599  ? sodium chloride flush (NS) 0.9 % injection 3 mL  3 mL Intravenous Q12H Eustace Moore, MD   3 mL at 11/25/21 3570  ? sodium chloride flush (NS) 0.9 % injection 3 mL  3 mL Intravenous PRN Eustace Moore, MD      ? vitamin E capsule 400 Units  400 Units Oral Daily Eustace Moore, MD   400 Units at 11/25/21 1779  ? ? ? ?Discharge Medications: ?Please see discharge summary for a list of discharge medications. ? ?Relevant Imaging Results: ? ?Relevant Lab Results: ? ? ?Additional Information ?SS# [id number] ? ?Jorge Ross, Jorge Ross ? ? ? ? ?

## 2021-11-25 NOTE — Progress Notes (Signed)
Postop day 2 lumbar laminectomy. Doing well, some back soreness. PLEASE ambulate patient in the hallway at least three times today. He is constipated which could be from anesthesia and pain meds but another contributing factor is the  lack of ambulation. Waiting for SNF placement.  ?

## 2021-11-25 NOTE — TOC Initial Note (Signed)
Transition of Care (TOC) - Initial/Assessment Note  ? ? ?Patient Details  ?Name: Jorge Ross ?MRN: [id number] ?Date of Birth: 04/12/1941 ? ?Transition of Care (TOC) CM/SW Contact:    ?Amador Cunas, LCSW ?Phone Number: ?11/25/2021, 12:05 PM ? ?Clinical Narrative: SW spoke with pt re PT recommendation for SNF. Pt from home with his wife who has dementia and is w/c bound. Pt is normally his wife's caregiver but has family staying with her this weekend. Pt reports his family has been in contact with [facility] and they are potentially able to accept him and his wife this week. Spoke to on-call admissions for [facility], Wyatt Haste, who agreed to review referral. [employer] auth started (ref # [id number]). SW will provide updates as available.  ? ?Wandra Feinstein, MSW, LCSW ?[phone] (coverage) ? ? ? ? ?Expected Discharge Plan: [place] ?Barriers to Discharge: Continued Medical Work up, No SNF bed, Insurance Authorization ? ? ?Patient Goals and CMS Choice ?  ?  ?Choice offered to / list presented to : Patient ? ?Expected Discharge Plan and Services ?Expected Discharge Plan: [place] ?  ?  ?Post Acute Care Choice: [place] ?Living arrangements for the past 2 months: [place] ?                ?  ?  ?  ?  ?  ?  ?  ?  ?  ?  ? ?Prior Living Arrangements/Services ?Living arrangements for the past 2 months: [place] ?Lives with:: Spouse ?Patient language and need for interpreter reviewed:: No ?       ?Need for Family Participation in Patient Care: No (Comment) ?Care giver support system in place?: No (comment) ?  ?Criminal Activity/Legal Involvement Pertinent to Current Situation/Hospitalization: No - Comment as needed ? ?Activities of Daily Living ?  ?  ? ?Permission Sought/Granted ?Permission sought to share information with : Customer service manager ?Permission granted to share information with : Yes, Verbal Permission Granted ?    ?   ?   ?   ? ?Emotional Assessment ?  ?  ?  ?Orientation: : Oriented to Self, Oriented to Place, Oriented to  Time, Oriented to Situation ?Alcohol / Substance Use: Not Applicable ?Psych Involvement: No (comment) ? ?Admission diagnosis:  S/P lumbar laminectomy [Z98.890] ?Patient Active Problem List  ? Diagnosis Date Noted  ? S/P lumbar laminectomy 11/23/2021  ? Intermittent complete heart block ([place]) 07/10/2021  ? 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 History of malignant neoplasm of colon 12/12/2020  ? Hypothyroidism 12/12/2020  ? Personal history of other malignant neoplasm of kidney 12/12/2020  ? Symptomatic bradycardia 06/23/2017  ? Port catheter in place 01/02/2016  ? Colon cancer ([place]) 05/11/2014  ? 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 Occult blood in stools 02/10/2014  ? Hypertension 12/04/2010  ? ?PCP:  Seward Carol, MD ?Pharmacy:   ?[facility], [address] ?[place] [address] ?Phone: [phone] Fax: [phone] ? ? ? ? ?Social Determinants of Health (SDOH) Interventions ?  ? ?Readmission Risk Interventions ?   ? View : No data to display.  ?  ?  ?  ? ? ? ?

## 2021-11-26 NOTE — TOC Progression Note (Signed)
Transition of Care (TOC) - Progression Note  ? ? ?Patient Details  ?Name: Jorge Ross ?MRN: [id number] ?Date of Birth: July 14, 1941 ? ?Transition of Care (TOC) CM/SW Contact  ?Geralynn Ochs, LCSW ?Phone Number: ?11/26/2021, 4:04 PM ? ?Clinical Narrative:   CSW spoke with Admissions at [facility] and confirmed that they are able to admit patient. Authorization is still pending at this time. CSW to follow. ? ? ? ?Expected Discharge Plan: [place] ?Barriers to Discharge: Continued Medical Work up, No SNF bed, Insurance Authorization ? ?Expected Discharge Plan and Services ?Expected Discharge Plan: [place] ?  ?  ?Post Acute Care Choice: [place] ?Living arrangements for the past 2 months: [place] ?                ?  ?  ?  ?  ?  ?  ?  ?  ?  ?  ? ? ?Social Determinants of Health (SDOH) Interventions ?  ? ?Readmission Risk Interventions ?   ? View : No data to display.  ?  ?  ?  ? ? ?

## 2021-11-26 NOTE — Progress Notes (Signed)
Subjective: ?Patient reports some back soreness, no leg pain ? ?Objective: ?Vital signs in last 24 hours: ?Temp:  [97.4 ?F (36.3 ?C)-97.8 ?F (36.6 ?C)] 97.8 ?F (36.6 ?C) (05/01 0359) ?Pulse Rate:  [57-90] 63 (05/01 0359) ?Resp:  [14-20] 14 (05/01 0359) ?BP: (110-161)/(60-74) 115/60 (05/01 0359) ?SpO2:  [94 %-100 %] 95 % (05/01 0359) ? ?Intake/Output from previous day: ?04/30 0701 - 05/01 0700 ?In: 840 [P.O.:840] ?Out: -  ?Intake/Output this shift: ?No intake/output data recorded. ? ?Neurologic: Grossly normal ? ?Lab Results: ?Lab Results  ?Component Value Date  ? WBC 5.3 11/19/2021  ? HGB 14.2 11/19/2021  ? HCT 42.0 11/19/2021  ? MCV 99.8 11/19/2021  ? PLT 214 11/19/2021  ? ?Lab Results  ?Component Value Date  ? INR 1.0 11/19/2021  ? ?BMET ?Lab Results  ?Component Value Date  ? NA 140 11/19/2021  ? K 4.2 11/19/2021  ? CL 104 11/19/2021  ? CO2 27 11/19/2021  ? GLUCOSE 105 (H) 11/19/2021  ? BUN 24 (H) 11/19/2021  ? CREATININE 1.30 (H) 11/19/2021  ? CALCIUM 9.4 11/19/2021  ? ? ?Studies/Results: ?No results found. ? ?Assessment/Plan: ?Awaiting snf, doing well ? ?Estimated body mass index is 26.5 kg/m? as calculated from the following: ?  Height as of this encounter: '5\' 11"'$  (1.803 m). ?  Weight as of this encounter: 86.2 kg. ? ? ? LOS: 2 days  ? ? ?Jorge Ross ?11/26/2021, 7:56 AM ? ? ? ? ?

## 2021-11-26 NOTE — Plan of Care (Signed)
  Problem: Education: Goal: Knowledge of General Education information will improve Description Including pain rating scale, medication(s)/side effects and non-pharmacologic comfort measures Outcome: Progressing   

## 2021-11-26 NOTE — Progress Notes (Signed)
PT Cancellation Note ? ?Patient Details ?Name: Jorge Ross ?MRN: [id number] ?DOB: 1941-03-13 ? ? ?Cancelled Treatment:    Reason Eval/Treat Not Completed: Other (comment); attempted twice to see pt, first he had just walked with nursing and shaved and washed up standing at sink and was too tired; second he had just gotten dinner, but agreed to walk later with nursing.  Will continue attempts.  ? ? ?Reginia Naas ?11/26/2021, 5:35 PM ?Magda Kiel, PT ?Acute Rehabilitation Services ?BBUYZ:[phone] ?[phone] ?11/26/2021 ? ?

## 2021-11-27 MED ORDER — OXYCODONE HCL 5 MG PO TABS: 5.0000 mg | ORAL_TABLET | Freq: Four times a day (QID) | ORAL | 0 refills | Status: DC | PRN

## 2021-11-27 NOTE — Progress Notes (Signed)
Report called to nurse Tammy at [facility].  ? ?Tiombe Tomeo, Tivis Ringer, RN ? ?

## 2021-11-27 NOTE — Plan of Care (Signed)
  Problem: Education: Goal: Knowledge of General Education information will improve Description Including pain rating scale, medication(s)/side effects and non-pharmacologic comfort measures Outcome: Progressing   Problem: Health Behavior/Discharge Planning: Goal: Ability to manage health-related needs will improve Outcome: Progressing   

## 2021-11-27 NOTE — Discharge Summary (Addendum)
Physician Discharge Summary  ?Patient ID: ?Jorge Ross ?MRN: [id number] ?DOB/AGE: 1941/03/07 81 y.o. ? ?Admit date: 11/23/2021 ?Discharge date: 11/27/2021 ? 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 ? ?Discharge Diagnoses: same ? ? ?Discharged Condition: good ? ?Hospital Course: The patient was admitted on 11/23/2021 and taken to the operating room where the patient underwent decompressive lumbar laminectomy. The patient tolerated the procedure well and was taken to the recovery room and then to the floor in stable condition. The hospital course was routine. There were no complications. The wound remained clean dry and intact. Pt had appropriate back soreness. No complaints of leg pain or new N/T/W. The patient remained afebrile with stable vital signs, and tolerated a regular diet. The patient continued to increase activities, and pain was well controlled with oral pain medications.  ? ?Consults: None ? ?Significant Diagnostic Studies:  ?Results for orders placed or performed during the hospital encounter of 11/19/21  ?Surgical pcr screen  ? Specimen: Nasal Mucosa; Nasal Swab  ?Result Value Ref Range  ? MRSA, PCR NEGATIVE NEGATIVE  ? Staphylococcus aureus NEGATIVE NEGATIVE  ?Protime-INR  ?Result Value Ref Range  ? Prothrombin Time 12.6 11.4 - 15.2 seconds  ? INR 1.0 0.8 - 1.2  ?Basic metabolic panel per protocol  ?Result Value Ref Range  ? Sodium 140 135 - 145 mmol/L  ? Potassium 4.2 3.5 - 5.1 mmol/L  ? Chloride 104 98 - 111 mmol/L  ? CO2 27 22 - 32 mmol/L  ? Glucose, Bld 105 (H) 70 - 99 mg/dL  ? BUN 24 (H) 8 - 23 mg/dL  ? Creatinine, Ser 1.30 (H) 0.61 - 1.24 mg/dL  ? Calcium 9.4 8.9 - 10.3 mg/dL  ? GFR, Estimated 56 (L) >60 mL/min  ? Anion gap 9 5 - 15  ?CBC per protocol  ?Result Value Ref Range  ? WBC 5.3 4.0 - 10.5 K/uL  ? RBC 4.21 (L) 4.22 - 5.81 MIL/uL  ? Hemoglobin 14.2 13.0 - 17.0 g/dL  ? HCT 42.0 39.0 - 52.0 %  ? MCV 99.8 80.0 - 100.0 fL  ? MCH 33.7 26.0 - 34.0 pg  ?  MCHC 33.8 30.0 - 36.0 g/dL  ? RDW 12.1 11.5 - 15.5 %  ? Platelets 214 150 - 400 K/uL  ? nRBC 0.0 0.0 - 0.2 %  ? ? ?DG Lumbar Spine 1 View ? ?Result Date: 11/23/2021 ?CLINICAL DATA:  L2-5 Laminectomy and Foraminotomy EXAM: LUMBAR SPINE - 1 VIEW COMPARISON:  CT lumbar spine 07/04/2021. FINDINGS: Single intraoperative cross-table radiograph. This image demonstrates a surgical probe posteriorly at the L3-L4 level. Multilevel degenerative change, better characterized on prior CT. No evidence of acute fracture or new malalignment. IMPRESSION: Intraoperative radiograph, as described above. Electronically Signed   By: Margaretha Sheffield M.D.   On: 11/23/2021 08:34   ? ?Antibiotics:  ?Anti-infectives (From admission, onward)  ? ? Start     Dose/Rate Route Frequency Ordered Stop  ? 11/23/21 1530  ceFAZolin (ANCEF) IVPB 2g/100 mL premix       ? 2 g ?200 mL/hr over 30 Minutes Intravenous Every 8 hours 11/23/21 1346 11/23/21 2343  ? 11/23/21 0615  ceFAZolin (ANCEF) IVPB 2g/100 mL premix       ? 2 g ?200 mL/hr over 30 Minutes Intravenous On call to O.R. 11/23/21 7902 11/23/21 0758  ? ?  ? ? ?Discharge Exam: ?Blood pressure 137/68, pulse 60, temperature 97.7 ?F (36.5 ?C), temperature source Oral, resp. rate 18, height '5\' 11"'$  (  1.803 m), weight 86.2 kg, SpO2 97 %. ?Neurologic: Grossly normal ?Dressing dry ? ?Discharge Medications:   ?Allergies as of 11/27/2021   ? ?   Reactions  ? Codeine Other (See Comments)  ? constipation  ? ?  ? ?  ?Medication List  ?  ? ?STOP taking these medications   ? ?traMADol 50 MG tablet ?Commonly known as: ULTRAM ?  ? ?  ? ?TAKE these medications   ? ?acetaminophen 500 MG tablet ?Commonly known as: TYLENOL ?Take 1,000 mg by mouth every 6 (six) hours as needed for moderate pain. ?  ?aspirin 81 MG tablet ?Take 81 mg by mouth daily. ?  ?irbesartan-hydrochlorothiazide 150-12.5 MG tablet ?Commonly known as: AVALIDE ?Take 1 tablet by mouth every morning. ?  ?multivitamin capsule ?Take 1 capsule by mouth daily. ?   ?oxyCODONE 5 MG immediate release tablet ?Commonly known as: Oxy IR/ROXICODONE ?Take 1 tablet (5 mg total) by mouth every 6 (six) hours as needed for moderate pain ((score 4 to 6)). ?  ?polyvinyl alcohol 1.4 % ophthalmic solution ?Commonly known as: LIQUIFILM TEARS ?Place 1 drop into both eyes as needed for dry eyes. ?  ?pregabalin 50 MG capsule ?Commonly known as: Lyrica ?Take 1 capsule (50 mg total) by mouth 2 (two) times daily. ?What changed: when to take this ?  ?vitamin E 180 MG (400 UNITS) capsule ?Take 400 Units by mouth daily. ?  ? ?  ? ? ?Disposition: SNF  ? ?Final Dx: Decompressive lumbar laminectomy ? ?Discharge Instructions   ? ? Call MD for:  persistant nausea and vomiting   Complete by: As directed ?  ? Call MD for:  redness, tenderness, or signs of infection (pain, swelling, redness, odor or green/yellow discharge around incision site)   Complete by: As directed ?  ? Call MD for:  severe uncontrolled pain   Complete by: As directed ?  ? Call MD for:  temperature >100.4   Complete by: As directed ?  ? Diet - low sodium heart healthy   Complete by: As directed ?  ? Increase activity slowly   Complete by: As directed ?  ? Remove dressing in 24 hours   Complete by: As directed ?  ? ?  ? ? ? Contact information for follow-up providers   ? ? Eustace Moore, MD. Schedule an appointment as soon as possible for a visit in 2 week(s).   ?Specialty: Neurosurgery ?Contact information: ?[address] ?[address] ?[place] [address] ?[phone] ? ? ?  ?  ? ?  ?  ? ? Contact information for after-discharge care   ? ? Destination   ? ? [facility] .   ?Service: Skilled Nursing ?Contact information: ?[address] ?[place] ?[phone] ? ?  ?  ? ?  ?  ? ?  ?  ? ?  ? ? ? ?Signed: ?Ocie Cornfield Navika Hoopes ?11/27/2021, 1:11 PM ? ? ?

## 2021-11-27 NOTE — TOC Transition Note (Addendum)
Transition of Care (TOC) - CM/SW Discharge Note ? ? ?Patient Details  ?Name: Jorge Ross ?MRN: [id number] ?Date of Birth: 1941-02-02 ? ?Transition of Care [facility]) CM/SW Contact:  ?Geralynn Ochs, LCSW ?Phone Number: ?11/27/2021, 2:33 PM ? ? ?Clinical Narrative:   CSW contacted [place] to check on auth status, and auth not processed as there was no SNF selected. CSW provided information and received insurance approval for patient. CSW sent authorization information and discharge summary to [facility], asked MD to print and sign script for narcotic to send to SNF with patient. Transport scheduled with [facility] for next available. ? ?Nurse to call report to [phone], Room 124A ? ? ? ?Final next level of care: [place] ?Barriers to Discharge: Barriers Resolved ? ? ?Patient Goals and CMS Choice ?  ?  ?Choice offered to / list presented to : Patient ? ?Discharge Placement ?  ?           ?Patient chooses bed at: [facility] ?Patient to be transferred to facility by: [facility] ?Name of family member notified: Self ?Patient and family notified of of transfer: 11/27/21 ? ?Discharge Plan and Services ?  ?  ?Post Acute Care Choice: [place]          ?  ?  ?  ?  ?  ?  ?  ?  ?  ?  ? ?Social Determinants of Health (SDOH) Interventions ?  ? ? ?Readmission Risk Interventions ?   ? View : No data to display.  ?  ?  ?  ? ? ? ? ? ?

## 2021-11-27 NOTE — Plan of Care (Signed)
?  Problem: Education: ?Goal: Knowledge of General Education information will improve ?Description: Including pain rating scale, medication(s)/side effects and non-pharmacologic comfort measures ?11/27/2021 1539 by Emmaline Life, RN ?Outcome: Adequate for Discharge ?11/27/2021 0856 by Emmaline Life, RN ?Outcome: Progressing ?  ?Problem: Health Behavior/Discharge Planning: ?Goal: Ability to manage health-related needs will improve ?11/27/2021 1539 by Emmaline Life, RN ?Outcome: Adequate for Discharge ?11/27/2021 0856 by Emmaline Life, RN ?Outcome: Progressing ?  ?Problem: Clinical Measurements: ?Goal: Ability to maintain clinical measurements within normal limits will improve ?Outcome: Adequate for Discharge ?Goal: Will remain free from infection ?Outcome: Adequate for Discharge ?Goal: Diagnostic test results will improve ?Outcome: Adequate for Discharge ?Goal: Respiratory complications will improve ?Outcome: Adequate for Discharge ?Goal: Cardiovascular complication will be avoided ?Outcome: Adequate for Discharge ?  ?Problem: Activity: ?Goal: Risk for activity intolerance will decrease ?Outcome: Adequate for Discharge ?  ?Problem: Nutrition: ?Goal: Adequate nutrition will be maintained ?Outcome: Adequate for Discharge ?  ?Problem: Coping: ?Goal: Level of anxiety will decrease ?Outcome: Adequate for Discharge ?  ?Problem: Elimination: ?Goal: Will not experience complications related to bowel motility ?Outcome: Adequate for Discharge ?Goal: Will not experience complications related to urinary retention ?Outcome: Adequate for Discharge ?  ?Problem: Pain Managment: ?Goal: General experience of comfort will improve ?Outcome: Adequate for Discharge ?  ?Problem: Safety: ?Goal: Ability to remain free from injury will improve ?Outcome: Adequate for Discharge ?  ?Problem: Skin Integrity: ?Goal: Risk for impaired skin integrity will decrease ?Outcome: Adequate for Discharge ?  ?Problem: Education: ?Goal: Ability to  verbalize activity precautions or restrictions will improve ?Outcome: Adequate for Discharge ?Goal: Knowledge of the prescribed therapeutic regimen will improve ?Outcome: Adequate for Discharge ?Goal: Understanding of discharge needs will improve ?Outcome: Adequate for Discharge ?  ?Problem: Activity: ?Goal: Ability to avoid complications of mobility impairment will improve ?Outcome: Adequate for Discharge ?Goal: Ability to tolerate increased activity will improve ?Outcome: Adequate for Discharge ?Goal: Will remain free from falls ?Outcome: Adequate for Discharge ?  ?Problem: Bowel/Gastric: ?Goal: Gastrointestinal status for postoperative course will improve ?Outcome: Adequate for Discharge ?  ?Problem: Clinical Measurements: ?Goal: Ability to maintain clinical measurements within normal limits will improve ?Outcome: Adequate for Discharge ?Goal: Postoperative complications will be avoided or minimized ?Outcome: Adequate for Discharge ?Goal: Diagnostic test results will improve ?Outcome: Adequate for Discharge ?  ?Problem: Pain Management: ?Goal: Pain level will decrease ?Outcome: Adequate for Discharge ?  ?Problem: Skin Integrity: ?Goal: Will show signs of wound healing ?Outcome: Adequate for Discharge ?  ?Problem: Bladder/Genitourinary: ?Goal: Urinary functional status for postoperative course will improve ?Outcome: Adequate for Discharge ?  ?Problem: Acute Rehab PT Goals(only PT should resolve) ?Goal: Pt Will Go Supine/Side To Sit ?Outcome: Adequate for Discharge ?Goal: Pt Will Go Sit To Supine/Side ?Outcome: Adequate for Discharge ?Goal: Patient Will Transfer Sit To/From Stand ?Outcome: Adequate for Discharge ?Goal: Pt Will Ambulate ?Outcome: Adequate for Discharge ?Goal: Pt Will Go Up/Down Stairs ?Outcome: Adequate for Discharge ?  ?Problem: Acute Rehab OT Goals (only OT should resolve) ?Goal: Pt. Will Perform Lower Body Bathing ?Outcome: Adequate for Discharge ?Goal: Pt. Will Perform Lower Body  Dressing ?Outcome: Adequate for Discharge ?Goal: Pt. Will Transfer To Toilet ?Outcome: Adequate for Discharge ?Goal: Pt. Will Perform Toileting-Clothing Manipulation ?Outcome: Adequate for Discharge ?  ?
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
## 2021-11-27 NOTE — Care Management Important Message (Signed)
Important Message ? ?Patient Details  ?Name: Jorge Ross ?MRN: [id number] ?Date of Birth: [birth year] ? ? ?Medicare Important Message Given:  Yes ? ? ? ? ?Herndon Grill ?11/27/2021, 12:39 PM ?

## 2021-11-28 DIAGNOSIS — I10 Essential (primary) hypertension: Secondary | ICD-10-CM | POA: Diagnosis not present

## 2021-11-28 DIAGNOSIS — N189 Chronic kidney disease, unspecified: Secondary | ICD-10-CM | POA: Diagnosis not present

## 2021-11-28 DIAGNOSIS — K5900 Constipation, unspecified: Secondary | ICD-10-CM | POA: Diagnosis not present

## 2021-11-28 DIAGNOSIS — M48062 Spinal stenosis, lumbar region with neurogenic claudication: Secondary | ICD-10-CM | POA: Diagnosis not present

## 2021-11-28 DIAGNOSIS — Z7401 Bed confinement status: Secondary | ICD-10-CM | POA: Diagnosis not present

## 2021-11-28 DIAGNOSIS — Z9889 Other specified postprocedural states: Secondary | ICD-10-CM | POA: Diagnosis not present

## 2021-11-28 DIAGNOSIS — R262 Difficulty in walking, not elsewhere classified: Secondary | ICD-10-CM | POA: Diagnosis not present

## 2021-11-28 DIAGNOSIS — M1990 Unspecified osteoarthritis, unspecified site: Secondary | ICD-10-CM | POA: Diagnosis not present

## 2021-11-28 DIAGNOSIS — I2510 Atherosclerotic heart disease of native coronary artery without angina pectoris: Secondary | ICD-10-CM | POA: Diagnosis not present

## 2021-11-28 DIAGNOSIS — Z20822 Contact with and (suspected) exposure to covid-19: Secondary | ICD-10-CM | POA: Diagnosis not present

## 2021-11-28 DIAGNOSIS — Z85528 Personal history of other malignant neoplasm of kidney: Secondary | ICD-10-CM | POA: Diagnosis not present

## 2021-11-28 DIAGNOSIS — Z950 Presence of cardiac pacemaker: Secondary | ICD-10-CM | POA: Diagnosis not present

## 2021-11-28 DIAGNOSIS — R2681 Unsteadiness on feet: Secondary | ICD-10-CM | POA: Diagnosis not present

## 2021-11-28 DIAGNOSIS — R52 Pain, unspecified: Secondary | ICD-10-CM | POA: Diagnosis not present

## 2021-11-28 DIAGNOSIS — Z4789 Encounter for other orthopedic aftercare: Secondary | ICD-10-CM | POA: Diagnosis not present

## 2021-11-28 DIAGNOSIS — Z7982 Long term (current) use of aspirin: Secondary | ICD-10-CM | POA: Diagnosis not present

## 2021-11-28 DIAGNOSIS — M2550 Pain in unspecified joint: Secondary | ICD-10-CM | POA: Diagnosis not present

## 2021-11-28 DIAGNOSIS — G629 Polyneuropathy, unspecified: Secondary | ICD-10-CM | POA: Diagnosis not present

## 2021-11-28 DIAGNOSIS — M6250 Muscle wasting and atrophy, not elsewhere classified, unspecified site: Secondary | ICD-10-CM | POA: Diagnosis not present

## 2021-11-28 NOTE — Progress Notes (Signed)
[facility] in to transport pt to [facility]. Pt A/O x4 and aware of situation. Pt was able to ambulate to stretcher from bed. Lavonda Jumbo, son, called and made aware his dad is on the way.  ?

## 2021-11-29 DIAGNOSIS — N189 Chronic kidney disease, unspecified: Secondary | ICD-10-CM | POA: Diagnosis not present

## 2021-11-29 DIAGNOSIS — I10 Essential (primary) hypertension: Secondary | ICD-10-CM | POA: Diagnosis not present

## 2021-11-29 DIAGNOSIS — M48062 Spinal stenosis, lumbar region with neurogenic claudication: Secondary | ICD-10-CM | POA: Diagnosis not present

## 2021-11-29 DIAGNOSIS — G629 Polyneuropathy, unspecified: Secondary | ICD-10-CM | POA: Diagnosis not present

## 2021-11-29 DIAGNOSIS — Z9889 Other specified postprocedural states: Secondary | ICD-10-CM | POA: Diagnosis not present

## 2021-11-29 DIAGNOSIS — M1990 Unspecified osteoarthritis, unspecified site: Secondary | ICD-10-CM | POA: Diagnosis not present

## 2021-11-29 DIAGNOSIS — I2510 Atherosclerotic heart disease of native coronary artery without angina pectoris: Secondary | ICD-10-CM | POA: Diagnosis not present

## 2021-12-04 DIAGNOSIS — R52 Pain, unspecified: Secondary | ICD-10-CM | POA: Diagnosis not present

## 2021-12-04 DIAGNOSIS — I10 Essential (primary) hypertension: Secondary | ICD-10-CM | POA: Diagnosis not present

## 2021-12-04 DIAGNOSIS — G629 Polyneuropathy, unspecified: Secondary | ICD-10-CM | POA: Diagnosis not present

## 2021-12-04 DIAGNOSIS — K5900 Constipation, unspecified: Secondary | ICD-10-CM | POA: Diagnosis not present

## 2021-12-05 DIAGNOSIS — M48062 Spinal stenosis, lumbar region with neurogenic claudication: Secondary | ICD-10-CM | POA: Diagnosis not present

## 2021-12-05 DIAGNOSIS — M6250 Muscle wasting and atrophy, not elsewhere classified, unspecified site: Secondary | ICD-10-CM | POA: Diagnosis not present

## 2021-12-05 DIAGNOSIS — Z950 Presence of cardiac pacemaker: Secondary | ICD-10-CM | POA: Diagnosis not present

## 2021-12-05 DIAGNOSIS — Z4789 Encounter for other orthopedic aftercare: Secondary | ICD-10-CM | POA: Diagnosis not present

## 2021-12-05 DIAGNOSIS — R2681 Unsteadiness on feet: Secondary | ICD-10-CM | POA: Diagnosis not present

## 2021-12-05 DIAGNOSIS — I10 Essential (primary) hypertension: Secondary | ICD-10-CM | POA: Diagnosis not present

## 2021-12-06 DIAGNOSIS — M6250 Muscle wasting and atrophy, not elsewhere classified, unspecified site: Secondary | ICD-10-CM | POA: Diagnosis not present

## 2021-12-06 DIAGNOSIS — Z4789 Encounter for other orthopedic aftercare: Secondary | ICD-10-CM | POA: Diagnosis not present

## 2021-12-06 DIAGNOSIS — R2681 Unsteadiness on feet: Secondary | ICD-10-CM | POA: Diagnosis not present

## 2021-12-06 DIAGNOSIS — R52 Pain, unspecified: Secondary | ICD-10-CM | POA: Diagnosis not present

## 2021-12-06 DIAGNOSIS — M48062 Spinal stenosis, lumbar region with neurogenic claudication: Secondary | ICD-10-CM | POA: Diagnosis not present

## 2021-12-06 DIAGNOSIS — G629 Polyneuropathy, unspecified: Secondary | ICD-10-CM | POA: Diagnosis not present

## 2021-12-06 DIAGNOSIS — R262 Difficulty in walking, not elsewhere classified: Secondary | ICD-10-CM | POA: Diagnosis not present

## 2021-12-06 DIAGNOSIS — Z950 Presence of cardiac pacemaker: Secondary | ICD-10-CM | POA: Diagnosis not present

## 2021-12-06 DIAGNOSIS — M1990 Unspecified osteoarthritis, unspecified site: Secondary | ICD-10-CM | POA: Diagnosis not present

## 2021-12-06 DIAGNOSIS — Z85528 Personal history of other malignant neoplasm of kidney: Secondary | ICD-10-CM | POA: Diagnosis not present

## 2021-12-06 DIAGNOSIS — K5900 Constipation, unspecified: Secondary | ICD-10-CM | POA: Diagnosis not present

## 2021-12-07 DIAGNOSIS — I10 Essential (primary) hypertension: Secondary | ICD-10-CM | POA: Diagnosis not present

## 2021-12-07 DIAGNOSIS — M1990 Unspecified osteoarthritis, unspecified site: Secondary | ICD-10-CM | POA: Diagnosis not present

## 2021-12-07 DIAGNOSIS — Z950 Presence of cardiac pacemaker: Secondary | ICD-10-CM | POA: Diagnosis not present

## 2021-12-07 DIAGNOSIS — R262 Difficulty in walking, not elsewhere classified: Secondary | ICD-10-CM | POA: Diagnosis not present

## 2021-12-07 DIAGNOSIS — M48062 Spinal stenosis, lumbar region with neurogenic claudication: Secondary | ICD-10-CM | POA: Diagnosis not present

## 2021-12-07 DIAGNOSIS — Z85528 Personal history of other malignant neoplasm of kidney: Secondary | ICD-10-CM | POA: Diagnosis not present

## 2021-12-07 DIAGNOSIS — M6250 Muscle wasting and atrophy, not elsewhere classified, unspecified site: Secondary | ICD-10-CM | POA: Diagnosis not present

## 2021-12-07 DIAGNOSIS — R2681 Unsteadiness on feet: Secondary | ICD-10-CM | POA: Diagnosis not present

## 2021-12-07 DIAGNOSIS — Z4789 Encounter for other orthopedic aftercare: Secondary | ICD-10-CM | POA: Diagnosis not present

## 2021-12-08 DIAGNOSIS — Z4789 Encounter for other orthopedic aftercare: Secondary | ICD-10-CM | POA: Diagnosis not present

## 2021-12-08 DIAGNOSIS — M1990 Unspecified osteoarthritis, unspecified site: Secondary | ICD-10-CM | POA: Diagnosis not present

## 2021-12-08 DIAGNOSIS — M48062 Spinal stenosis, lumbar region with neurogenic claudication: Secondary | ICD-10-CM | POA: Diagnosis not present

## 2021-12-08 DIAGNOSIS — R262 Difficulty in walking, not elsewhere classified: Secondary | ICD-10-CM | POA: Diagnosis not present

## 2021-12-08 DIAGNOSIS — Z85528 Personal history of other malignant neoplasm of kidney: Secondary | ICD-10-CM | POA: Diagnosis not present

## 2021-12-08 DIAGNOSIS — M6250 Muscle wasting and atrophy, not elsewhere classified, unspecified site: Secondary | ICD-10-CM | POA: Diagnosis not present

## 2021-12-08 DIAGNOSIS — Z950 Presence of cardiac pacemaker: Secondary | ICD-10-CM | POA: Diagnosis not present

## 2021-12-08 DIAGNOSIS — R2681 Unsteadiness on feet: Secondary | ICD-10-CM | POA: Diagnosis not present

## 2021-12-10 DIAGNOSIS — Z4789 Encounter for other orthopedic aftercare: Secondary | ICD-10-CM | POA: Diagnosis not present

## 2021-12-10 DIAGNOSIS — Z85528 Personal history of other malignant neoplasm of kidney: Secondary | ICD-10-CM | POA: Diagnosis not present

## 2021-12-10 DIAGNOSIS — R262 Difficulty in walking, not elsewhere classified: Secondary | ICD-10-CM | POA: Diagnosis not present

## 2021-12-10 DIAGNOSIS — M6250 Muscle wasting and atrophy, not elsewhere classified, unspecified site: Secondary | ICD-10-CM | POA: Diagnosis not present

## 2021-12-10 DIAGNOSIS — Z950 Presence of cardiac pacemaker: Secondary | ICD-10-CM | POA: Diagnosis not present

## 2021-12-10 DIAGNOSIS — M1990 Unspecified osteoarthritis, unspecified site: Secondary | ICD-10-CM | POA: Diagnosis not present

## 2021-12-10 DIAGNOSIS — M48062 Spinal stenosis, lumbar region with neurogenic claudication: Secondary | ICD-10-CM | POA: Diagnosis not present

## 2021-12-10 DIAGNOSIS — R2681 Unsteadiness on feet: Secondary | ICD-10-CM | POA: Diagnosis not present

## 2021-12-10 DIAGNOSIS — I10 Essential (primary) hypertension: Secondary | ICD-10-CM | POA: Diagnosis not present

## 2021-12-11 DIAGNOSIS — R2681 Unsteadiness on feet: Secondary | ICD-10-CM | POA: Diagnosis not present

## 2021-12-11 DIAGNOSIS — Z85528 Personal history of other malignant neoplasm of kidney: Secondary | ICD-10-CM | POA: Diagnosis not present

## 2021-12-11 DIAGNOSIS — M1990 Unspecified osteoarthritis, unspecified site: Secondary | ICD-10-CM | POA: Diagnosis not present

## 2021-12-11 DIAGNOSIS — R262 Difficulty in walking, not elsewhere classified: Secondary | ICD-10-CM | POA: Diagnosis not present

## 2021-12-11 DIAGNOSIS — I10 Essential (primary) hypertension: Secondary | ICD-10-CM | POA: Diagnosis not present

## 2021-12-11 DIAGNOSIS — M48062 Spinal stenosis, lumbar region with neurogenic claudication: Secondary | ICD-10-CM | POA: Diagnosis not present

## 2021-12-11 DIAGNOSIS — Z4789 Encounter for other orthopedic aftercare: Secondary | ICD-10-CM | POA: Diagnosis not present

## 2021-12-11 DIAGNOSIS — Z950 Presence of cardiac pacemaker: Secondary | ICD-10-CM | POA: Diagnosis not present

## 2021-12-11 DIAGNOSIS — G629 Polyneuropathy, unspecified: Secondary | ICD-10-CM | POA: Diagnosis not present

## 2021-12-11 DIAGNOSIS — M6250 Muscle wasting and atrophy, not elsewhere classified, unspecified site: Secondary | ICD-10-CM | POA: Diagnosis not present

## 2021-12-11 DIAGNOSIS — M62838 Other muscle spasm: Secondary | ICD-10-CM | POA: Diagnosis not present

## 2021-12-12 DIAGNOSIS — R262 Difficulty in walking, not elsewhere classified: Secondary | ICD-10-CM | POA: Diagnosis not present

## 2021-12-12 DIAGNOSIS — R2681 Unsteadiness on feet: Secondary | ICD-10-CM | POA: Diagnosis not present

## 2021-12-12 DIAGNOSIS — M1990 Unspecified osteoarthritis, unspecified site: Secondary | ICD-10-CM | POA: Diagnosis not present

## 2021-12-12 DIAGNOSIS — Z950 Presence of cardiac pacemaker: Secondary | ICD-10-CM | POA: Diagnosis not present

## 2021-12-12 DIAGNOSIS — Z4789 Encounter for other orthopedic aftercare: Secondary | ICD-10-CM | POA: Diagnosis not present

## 2021-12-12 DIAGNOSIS — Z85528 Personal history of other malignant neoplasm of kidney: Secondary | ICD-10-CM | POA: Diagnosis not present

## 2021-12-12 DIAGNOSIS — M48062 Spinal stenosis, lumbar region with neurogenic claudication: Secondary | ICD-10-CM | POA: Diagnosis not present

## 2021-12-12 DIAGNOSIS — M6250 Muscle wasting and atrophy, not elsewhere classified, unspecified site: Secondary | ICD-10-CM | POA: Diagnosis not present

## 2021-12-13 DIAGNOSIS — R262 Difficulty in walking, not elsewhere classified: Secondary | ICD-10-CM | POA: Diagnosis not present

## 2021-12-13 DIAGNOSIS — R2681 Unsteadiness on feet: Secondary | ICD-10-CM | POA: Diagnosis not present

## 2021-12-13 DIAGNOSIS — M6250 Muscle wasting and atrophy, not elsewhere classified, unspecified site: Secondary | ICD-10-CM | POA: Diagnosis not present

## 2021-12-13 DIAGNOSIS — M48062 Spinal stenosis, lumbar region with neurogenic claudication: Secondary | ICD-10-CM | POA: Diagnosis not present

## 2021-12-13 DIAGNOSIS — M1990 Unspecified osteoarthritis, unspecified site: Secondary | ICD-10-CM | POA: Diagnosis not present

## 2021-12-13 DIAGNOSIS — Z950 Presence of cardiac pacemaker: Secondary | ICD-10-CM | POA: Diagnosis not present

## 2021-12-13 DIAGNOSIS — Z4789 Encounter for other orthopedic aftercare: Secondary | ICD-10-CM | POA: Diagnosis not present

## 2021-12-13 DIAGNOSIS — Z85528 Personal history of other malignant neoplasm of kidney: Secondary | ICD-10-CM | POA: Diagnosis not present

## 2021-12-14 DIAGNOSIS — M48062 Spinal stenosis, lumbar region with neurogenic claudication: Secondary | ICD-10-CM | POA: Diagnosis not present

## 2021-12-14 DIAGNOSIS — R262 Difficulty in walking, not elsewhere classified: Secondary | ICD-10-CM | POA: Diagnosis not present

## 2021-12-14 DIAGNOSIS — Z85528 Personal history of other malignant neoplasm of kidney: Secondary | ICD-10-CM | POA: Diagnosis not present

## 2021-12-14 DIAGNOSIS — M6250 Muscle wasting and atrophy, not elsewhere classified, unspecified site: Secondary | ICD-10-CM | POA: Diagnosis not present

## 2021-12-14 DIAGNOSIS — Z4789 Encounter for other orthopedic aftercare: Secondary | ICD-10-CM | POA: Diagnosis not present

## 2021-12-14 DIAGNOSIS — R2681 Unsteadiness on feet: Secondary | ICD-10-CM | POA: Diagnosis not present

## 2021-12-14 DIAGNOSIS — M1990 Unspecified osteoarthritis, unspecified site: Secondary | ICD-10-CM | POA: Diagnosis not present

## 2021-12-14 DIAGNOSIS — Z950 Presence of cardiac pacemaker: Secondary | ICD-10-CM | POA: Diagnosis not present

## 2021-12-15 DIAGNOSIS — R2681 Unsteadiness on feet: Secondary | ICD-10-CM | POA: Diagnosis not present

## 2021-12-15 DIAGNOSIS — Z950 Presence of cardiac pacemaker: Secondary | ICD-10-CM | POA: Diagnosis not present

## 2021-12-15 DIAGNOSIS — R262 Difficulty in walking, not elsewhere classified: Secondary | ICD-10-CM | POA: Diagnosis not present

## 2021-12-15 DIAGNOSIS — M1990 Unspecified osteoarthritis, unspecified site: Secondary | ICD-10-CM | POA: Diagnosis not present

## 2021-12-15 DIAGNOSIS — Z85528 Personal history of other malignant neoplasm of kidney: Secondary | ICD-10-CM | POA: Diagnosis not present

## 2021-12-15 DIAGNOSIS — M48062 Spinal stenosis, lumbar region with neurogenic claudication: Secondary | ICD-10-CM | POA: Diagnosis not present

## 2021-12-15 DIAGNOSIS — Z4789 Encounter for other orthopedic aftercare: Secondary | ICD-10-CM | POA: Diagnosis not present

## 2021-12-15 DIAGNOSIS — M6250 Muscle wasting and atrophy, not elsewhere classified, unspecified site: Secondary | ICD-10-CM | POA: Diagnosis not present

## 2021-12-17 DIAGNOSIS — I10 Essential (primary) hypertension: Secondary | ICD-10-CM | POA: Diagnosis not present

## 2021-12-17 DIAGNOSIS — R262 Difficulty in walking, not elsewhere classified: Secondary | ICD-10-CM | POA: Diagnosis not present

## 2021-12-17 DIAGNOSIS — R2681 Unsteadiness on feet: Secondary | ICD-10-CM | POA: Diagnosis not present

## 2021-12-17 DIAGNOSIS — M1990 Unspecified osteoarthritis, unspecified site: Secondary | ICD-10-CM | POA: Diagnosis not present

## 2021-12-17 DIAGNOSIS — Z4789 Encounter for other orthopedic aftercare: Secondary | ICD-10-CM | POA: Diagnosis not present

## 2021-12-17 DIAGNOSIS — M6250 Muscle wasting and atrophy, not elsewhere classified, unspecified site: Secondary | ICD-10-CM | POA: Diagnosis not present

## 2021-12-17 DIAGNOSIS — Z950 Presence of cardiac pacemaker: Secondary | ICD-10-CM | POA: Diagnosis not present

## 2021-12-17 DIAGNOSIS — Z85528 Personal history of other malignant neoplasm of kidney: Secondary | ICD-10-CM | POA: Diagnosis not present

## 2021-12-17 DIAGNOSIS — M48062 Spinal stenosis, lumbar region with neurogenic claudication: Secondary | ICD-10-CM | POA: Diagnosis not present

## 2021-12-18 DIAGNOSIS — N189 Chronic kidney disease, unspecified: Secondary | ICD-10-CM | POA: Diagnosis not present

## 2021-12-18 DIAGNOSIS — Z9889 Other specified postprocedural states: Secondary | ICD-10-CM | POA: Diagnosis not present

## 2021-12-18 DIAGNOSIS — M1990 Unspecified osteoarthritis, unspecified site: Secondary | ICD-10-CM | POA: Diagnosis not present

## 2021-12-18 DIAGNOSIS — G629 Polyneuropathy, unspecified: Secondary | ICD-10-CM | POA: Diagnosis not present

## 2021-12-18 DIAGNOSIS — K5900 Constipation, unspecified: Secondary | ICD-10-CM | POA: Diagnosis not present

## 2021-12-18 DIAGNOSIS — I10 Essential (primary) hypertension: Secondary | ICD-10-CM | POA: Diagnosis not present

## 2021-12-18 DIAGNOSIS — M48062 Spinal stenosis, lumbar region with neurogenic claudication: Secondary | ICD-10-CM | POA: Diagnosis not present

## 2021-12-18 DIAGNOSIS — I2510 Atherosclerotic heart disease of native coronary artery without angina pectoris: Secondary | ICD-10-CM | POA: Diagnosis not present

## 2021-12-21 DIAGNOSIS — M48061 Spinal stenosis, lumbar region without neurogenic claudication: Secondary | ICD-10-CM | POA: Diagnosis not present

## 2021-12-21 DIAGNOSIS — M48062 Spinal stenosis, lumbar region with neurogenic claudication: Secondary | ICD-10-CM | POA: Diagnosis not present

## 2021-12-25 ENCOUNTER — Ambulatory Visit (INDEPENDENT_AMBULATORY_CARE_PROVIDER_SITE_OTHER): Payer: Medicare PPO

## 2021-12-25 DIAGNOSIS — I495 Sick sinus syndrome: Secondary | ICD-10-CM | POA: Diagnosis not present

## 2021-12-25 LAB — CUP PACEART REMOTE DEVICE CHECK
Battery Remaining Longevity: 59 mo
Battery Remaining Percentage: 53 %
Battery Voltage: 2.98 V
Brady Statistic AP VP Percent: 73 %
Brady Statistic AP VS Percent: 1 %
Brady Statistic AS VP Percent: 27 %
Brady Statistic AS VS Percent: 1 %
Brady Statistic RA Percent Paced: 73 %
Brady Statistic RV Percent Paced: 99 %
Date Time Interrogation Session: 20230529020017
Implantable Lead Implant Date: 20181126
Implantable Lead Implant Date: 20181126
Implantable Lead Location: 753859
Implantable Lead Location: 753860
Implantable Lead Model: 5076
Implantable Lead Model: 5076
Implantable Pulse Generator Implant Date: 20181126
Lead Channel Impedance Value: 450 Ohm
Lead Channel Impedance Value: 450 Ohm
Lead Channel Pacing Threshold Amplitude: 0.5 V
Lead Channel Pacing Threshold Amplitude: 0.875 V
Lead Channel Pacing Threshold Pulse Width: 0.5 ms
Lead Channel Pacing Threshold Pulse Width: 0.5 ms
Lead Channel Sensing Intrinsic Amplitude: 3.3 mV
Lead Channel Sensing Intrinsic Amplitude: 4.9 mV
Lead Channel Setting Pacing Amplitude: 1.125
Lead Channel Setting Pacing Amplitude: 2 V
Lead Channel Setting Pacing Pulse Width: 0.5 ms
Lead Channel Setting Sensing Sensitivity: 2 mV
Pulse Gen Model: 2272
Pulse Gen Serial Number: 8971004

## 2022-01-09 NOTE — Progress Notes (Signed)
Remote pacemaker transmission.   

## 2022-02-05 ENCOUNTER — Ambulatory Visit: Payer: Medicare PPO | Admitting: Podiatry

## 2022-02-05 DIAGNOSIS — M21612 Bunion of left foot: Secondary | ICD-10-CM

## 2022-02-05 DIAGNOSIS — M2042 Other hammer toe(s) (acquired), left foot: Secondary | ICD-10-CM | POA: Diagnosis not present

## 2022-02-05 DIAGNOSIS — C801 Malignant (primary) neoplasm, unspecified: Secondary | ICD-10-CM | POA: Diagnosis not present

## 2022-02-05 DIAGNOSIS — M2041 Other hammer toe(s) (acquired), right foot: Secondary | ICD-10-CM | POA: Diagnosis not present

## 2022-02-05 DIAGNOSIS — B351 Tinea unguium: Secondary | ICD-10-CM | POA: Diagnosis not present

## 2022-02-05 DIAGNOSIS — L84 Corns and callosities: Secondary | ICD-10-CM | POA: Diagnosis not present

## 2022-02-05 DIAGNOSIS — G63 Polyneuropathy in diseases classified elsewhere: Secondary | ICD-10-CM | POA: Diagnosis not present

## 2022-02-05 DIAGNOSIS — M79674 Pain in right toe(s): Secondary | ICD-10-CM | POA: Diagnosis not present

## 2022-02-05 DIAGNOSIS — M79675 Pain in left toe(s): Secondary | ICD-10-CM

## 2022-02-08 NOTE — Progress Notes (Signed)
Subjective: 81 year old male presents the office today for follow evaluation of callus, wound on his toes as well as for thick and elongated toenails that he cannot trim himself.  No open lesions that he reports.  No new concerns.  Objective: AAO x3, NAD DP/PT pulses palpable bilaterally, CRT less than 3 seconds Nails are hypertrophic, dystrophic, brittle, discolored, elongated 10. No surrounding redness or drainage. Tenderness nails 1-5 bilaterally.  Hyperkeratotic lesion right medial second toe as well as left submetatarsal 2 without any underlying ulceration drainage or signs of infection.  No open lesions or pre-ulcerative lesions are identified today. Hammertoes and bunion present.  No pain with calf compression, swelling, warmth, erythema  Assessment: Symptomatic onychomycosis, hyperkeratotic lesions neuropathy  Plan: -All treatment options discussed with the patient including all alternatives, risks, complications.  -Sharply debrided the nails x10 without any complications or bleeding. -Hyperkeratotic lesion sharp debrided x2 without any complications or bleeding as a courtesy but recommend moisturizer and offloading daily to help prevent reoccurrence.  Likely result of hammertoes. -Refilled Lyrica -Daily foot inspection -Patient encouraged to call the office with any questions, concerns, change in symptoms.   RTC 3 months or sooner if needed  Trula Slade DPM

## 2022-03-04 DIAGNOSIS — M48061 Spinal stenosis, lumbar region without neurogenic claudication: Secondary | ICD-10-CM | POA: Diagnosis not present

## 2022-03-04 DIAGNOSIS — R269 Unspecified abnormalities of gait and mobility: Secondary | ICD-10-CM | POA: Diagnosis not present

## 2022-03-04 DIAGNOSIS — M6281 Muscle weakness (generalized): Secondary | ICD-10-CM | POA: Diagnosis not present

## 2022-03-11 DIAGNOSIS — Z03818 Encounter for observation for suspected exposure to other biological agents ruled out: Secondary | ICD-10-CM | POA: Diagnosis not present

## 2022-03-11 DIAGNOSIS — J069 Acute upper respiratory infection, unspecified: Secondary | ICD-10-CM | POA: Diagnosis not present

## 2022-03-26 ENCOUNTER — Ambulatory Visit (INDEPENDENT_AMBULATORY_CARE_PROVIDER_SITE_OTHER): Payer: Medicare PPO

## 2022-03-26 DIAGNOSIS — I495 Sick sinus syndrome: Secondary | ICD-10-CM | POA: Diagnosis not present

## 2022-03-26 LAB — CUP PACEART REMOTE DEVICE CHECK
Battery Remaining Longevity: 56 mo
Battery Remaining Percentage: 50 %
Battery Voltage: 2.98 V
Brady Statistic AP VP Percent: 66 %
Brady Statistic AP VS Percent: 1 %
Brady Statistic AS VP Percent: 34 %
Brady Statistic AS VS Percent: 1 %
Brady Statistic RA Percent Paced: 66 %
Brady Statistic RV Percent Paced: 99 %
Date Time Interrogation Session: 20230828020037
Implantable Lead Implant Date: 20181126
Implantable Lead Implant Date: 20181126
Implantable Lead Location: 753859
Implantable Lead Location: 753860
Implantable Lead Model: 5076
Implantable Lead Model: 5076
Implantable Pulse Generator Implant Date: 20181126
Lead Channel Impedance Value: 430 Ohm
Lead Channel Impedance Value: 440 Ohm
Lead Channel Pacing Threshold Amplitude: 0.5 V
Lead Channel Pacing Threshold Amplitude: 0.875 V
Lead Channel Pacing Threshold Pulse Width: 0.5 ms
Lead Channel Pacing Threshold Pulse Width: 0.5 ms
Lead Channel Sensing Intrinsic Amplitude: 3.3 mV
Lead Channel Sensing Intrinsic Amplitude: 4.6 mV
Lead Channel Setting Pacing Amplitude: 1.125
Lead Channel Setting Pacing Amplitude: 2 V
Lead Channel Setting Pacing Pulse Width: 0.5 ms
Lead Channel Setting Sensing Sensitivity: 2 mV
Pulse Gen Model: 2272
Pulse Gen Serial Number: 8971004

## 2022-04-19 NOTE — Progress Notes (Signed)
Remote pacemaker transmission.   

## 2022-05-02 DIAGNOSIS — F4321 Adjustment disorder with depressed mood: Secondary | ICD-10-CM | POA: Diagnosis not present

## 2022-05-02 DIAGNOSIS — Z23 Encounter for immunization: Secondary | ICD-10-CM | POA: Diagnosis not present

## 2022-05-02 DIAGNOSIS — R600 Localized edema: Secondary | ICD-10-CM | POA: Diagnosis not present

## 2022-05-02 DIAGNOSIS — R8290 Unspecified abnormal findings in urine: Secondary | ICD-10-CM | POA: Diagnosis not present

## 2022-05-09 ENCOUNTER — Ambulatory Visit: Payer: Medicare PPO | Admitting: Podiatry

## 2022-05-09 ENCOUNTER — Ambulatory Visit (INDEPENDENT_AMBULATORY_CARE_PROVIDER_SITE_OTHER): Payer: Medicare PPO | Admitting: Podiatry

## 2022-05-09 ENCOUNTER — Other Ambulatory Visit: Payer: Self-pay | Admitting: Podiatry

## 2022-05-09 DIAGNOSIS — B351 Tinea unguium: Secondary | ICD-10-CM | POA: Diagnosis not present

## 2022-05-09 DIAGNOSIS — C801 Malignant (primary) neoplasm, unspecified: Secondary | ICD-10-CM

## 2022-05-09 DIAGNOSIS — G63 Polyneuropathy in diseases classified elsewhere: Secondary | ICD-10-CM | POA: Diagnosis not present

## 2022-05-09 DIAGNOSIS — M79674 Pain in right toe(s): Secondary | ICD-10-CM | POA: Diagnosis not present

## 2022-05-09 DIAGNOSIS — M79675 Pain in left toe(s): Secondary | ICD-10-CM

## 2022-05-09 DIAGNOSIS — Q828 Other specified congenital malformations of skin: Secondary | ICD-10-CM | POA: Diagnosis not present

## 2022-05-09 NOTE — Patient Instructions (Signed)
For the small blister on the left leg, keep this covered. If you notice any redness, swelling, drainage or any signs of infection, call the office at [phone]

## 2022-05-12 NOTE — Progress Notes (Signed)
Subjective: 81 year old male presents the office today for follow evaluation of callus, wound on his toes as well as for thick and elongated toenails that he cannot trim himself.  No open lesions that he reports.  He has not been keeping moisturizer on his feet.  His wife recently passed away and he has been grieving.  Objective: AAO x3, NAD DP/PT pulses palpable bilaterally, CRT less than 3 seconds Nails are hypertrophic, dystrophic, brittle, discolored, elongated 10. No surrounding redness or drainage. Tenderness nails 1-5 bilaterally.  Hyperkeratotic lesion right medial second toe as well as left submetatarsal 2 without any underlying ulceration drainage or signs of infection.  No open lesions or pre-ulcerative lesions are identified today. Small blister on the left anterior leg without any drainage or pus. Hammertoes and bunion present.  Prominent metatarsal heads. No pain with calf compression, swelling, warmth, erythema  Assessment: Symptomatic onychomycosis, hyperkeratotic lesions neuropathy  Plan: -All treatment options discussed with the patient including all alternatives, risks, complications.  -Sharply debrided the nails x10 without any complications or bleeding. -Hyperkeratotic lesion sharp debrided x2 without any complications or bleeding.  Recommend moisturizer daily. -Refilled Lyrica-doing well with this dose. -Monitor the blister on left side.  Keep a bandage on it.  Monitor for any signs or symptoms of infection.  The next couple weeks morning there is significantly worsening. -Daily foot inspection -Patient encouraged to call the office with any questions, concerns, change in symptoms.   RTC 3 months or sooner if needed  Trula Slade DPM

## 2022-05-21 DIAGNOSIS — M48062 Spinal stenosis, lumbar region with neurogenic claudication: Secondary | ICD-10-CM | POA: Diagnosis not present

## 2022-05-28 DIAGNOSIS — R3915 Urgency of urination: Secondary | ICD-10-CM | POA: Diagnosis not present

## 2022-05-28 DIAGNOSIS — N281 Cyst of kidney, acquired: Secondary | ICD-10-CM | POA: Diagnosis not present

## 2022-05-28 DIAGNOSIS — C642 Malignant neoplasm of left kidney, except renal pelvis: Secondary | ICD-10-CM | POA: Diagnosis not present

## 2022-05-30 ENCOUNTER — Ambulatory Visit: Payer: Medicare PPO | Admitting: Podiatry

## 2022-06-13 DIAGNOSIS — M25552 Pain in left hip: Secondary | ICD-10-CM | POA: Diagnosis not present

## 2022-06-25 ENCOUNTER — Ambulatory Visit (INDEPENDENT_AMBULATORY_CARE_PROVIDER_SITE_OTHER): Payer: Medicare PPO

## 2022-06-25 DIAGNOSIS — I495 Sick sinus syndrome: Secondary | ICD-10-CM

## 2022-06-26 LAB — CUP PACEART REMOTE DEVICE CHECK
Battery Remaining Longevity: 53 mo
Battery Remaining Percentage: 47 %
Battery Voltage: 2.98 V
Brady Statistic AP VP Percent: 69 %
Brady Statistic AP VS Percent: 1 %
Brady Statistic AS VP Percent: 31 %
Brady Statistic AS VS Percent: 1 %
Brady Statistic RA Percent Paced: 69 %
Brady Statistic RV Percent Paced: 99 %
Date Time Interrogation Session: 20231127043642
Implantable Lead Connection Status: 753985
Implantable Lead Connection Status: 753985
Implantable Lead Implant Date: 20181126
Implantable Lead Implant Date: 20181126
Implantable Lead Location: 753859
Implantable Lead Location: 753860
Implantable Lead Model: 5076
Implantable Lead Model: 5076
Implantable Pulse Generator Implant Date: 20181126
Lead Channel Impedance Value: 440 Ohm
Lead Channel Impedance Value: 460 Ohm
Lead Channel Pacing Threshold Amplitude: 0.5 V
Lead Channel Pacing Threshold Amplitude: 0.875 V
Lead Channel Pacing Threshold Pulse Width: 0.5 ms
Lead Channel Pacing Threshold Pulse Width: 0.5 ms
Lead Channel Sensing Intrinsic Amplitude: 3.3 mV
Lead Channel Sensing Intrinsic Amplitude: 3.7 mV
Lead Channel Setting Pacing Amplitude: 1.125
Lead Channel Setting Pacing Amplitude: 2 V
Lead Channel Setting Pacing Pulse Width: 0.5 ms
Lead Channel Setting Sensing Sensitivity: 2 mV
Pulse Gen Model: 2272
Pulse Gen Serial Number: 8971004

## 2022-07-17 ENCOUNTER — Other Ambulatory Visit: Payer: Self-pay | Admitting: Podiatry

## 2022-07-24 NOTE — Progress Notes (Signed)
Remote pacemaker transmission.   

## 2022-07-26 ENCOUNTER — Encounter: Payer: Self-pay | Admitting: Internal Medicine

## 2022-07-26 ENCOUNTER — Ambulatory Visit: Payer: Medicare PPO | Attending: Internal Medicine | Admitting: Internal Medicine

## 2022-07-26 VITALS — BP 120/68 | HR 79 | Ht 71.0 in | Wt 194.0 lb

## 2022-07-26 DIAGNOSIS — I495 Sick sinus syndrome: Secondary | ICD-10-CM

## 2022-07-26 DIAGNOSIS — I442 Atrioventricular block, complete: Secondary | ICD-10-CM | POA: Diagnosis not present

## 2022-07-26 DIAGNOSIS — Z950 Presence of cardiac pacemaker: Secondary | ICD-10-CM | POA: Diagnosis not present

## 2022-07-26 DIAGNOSIS — R55 Syncope and collapse: Secondary | ICD-10-CM
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
## 2022-07-26 NOTE — Patient Instructions (Signed)
Medication Instructions:  Your physician recommends that you continue on your current medications as directed. Please refer to the Current Medication list given to you today.  *If you need a refill on your cardiac medications before your next appointment, please call your pharmacy*  Follow-Up: At [facility], you and your health needs are our priority.  As part of our continuing mission to provide you with exceptional heart care, we have created designated Provider Care Teams.  These Care Teams include your primary Cardiologist (physician) and Advanced Practice Providers (APPs -  Physician Assistants and Nurse Practitioners) who all work together to provide you with the care you need, when you need it.  Your next appointment:   1 year(s)  The format for your next appointment:   In Person  Provider:   You may see Dr. Virl Axe or one of the following Advanced Practice Providers on your designated Care Team:   Tommye Standard, [place] Legrand Como "Jonni Sanger" Chalmers Cater, [place]   Important Information About Sugar

## 2022-08-08 ENCOUNTER — Ambulatory Visit: Payer: Medicare PPO | Admitting: Podiatry

## 2022-08-20 ENCOUNTER — Ambulatory Visit (INDEPENDENT_AMBULATORY_CARE_PROVIDER_SITE_OTHER): Payer: Medicare PPO | Admitting: Podiatry

## 2022-08-20 DIAGNOSIS — L97521 Non-pressure chronic ulcer of other part of left foot limited to breakdown of skin: Secondary | ICD-10-CM | POA: Diagnosis not present

## 2022-08-20 DIAGNOSIS — M21612 Bunion of left foot: Secondary | ICD-10-CM | POA: Diagnosis not present

## 2022-08-20 DIAGNOSIS — Q828 Other specified congenital malformations of skin: Secondary | ICD-10-CM

## 2022-08-20 DIAGNOSIS — M79675 Pain in left toe(s): Secondary | ICD-10-CM | POA: Diagnosis not present

## 2022-08-20 DIAGNOSIS — B351 Tinea unguium: Secondary | ICD-10-CM | POA: Diagnosis not present

## 2022-08-20 DIAGNOSIS — M79674 Pain in right toe(s): Secondary | ICD-10-CM

## 2022-08-20 MED ORDER — PREGABALIN 50 MG PO CAPS: ORAL_CAPSULE | ORAL | 1 refills | Status: DC

## 2022-08-20 NOTE — Patient Instructions (Signed)
Keep a small amount of antibiotic ointment and bandage on the left bunion area day. Wear pad and shoe to avoid pressure. If not healed in 2 weeks, let me know, or sooner if any worsening.   Monitor for any signs/symptoms of infection. Call the office immediately if any occur or go directly to the emergency room. Call with any questions/concerns.

## 2022-08-20 NOTE — Progress Notes (Unsigned)
Wore shoes too tight, small lesion left bunion Consider bunion sx after moving in April-pad Nail tirm Callus  x 3 Pregabalin in combo with prostate meds dizzy- move time of day for preg

## 2022-08-26 DIAGNOSIS — N5201 Erectile dysfunction due to arterial insufficiency: Secondary | ICD-10-CM | POA: Diagnosis not present

## 2022-08-26 DIAGNOSIS — C642 Malignant neoplasm of left kidney, except renal pelvis: Secondary | ICD-10-CM | POA: Diagnosis not present

## 2022-08-26 DIAGNOSIS — R3915 Urgency of urination: Secondary | ICD-10-CM | POA: Diagnosis not present

## 2022-09-24 ENCOUNTER — Ambulatory Visit: Payer: Medicare PPO

## 2022-09-24 DIAGNOSIS — I495 Sick sinus syndrome: Secondary | ICD-10-CM | POA: Diagnosis not present

## 2022-09-24 LAB — CUP PACEART REMOTE DEVICE CHECK
Battery Remaining Longevity: 48 mo
Battery Remaining Percentage: 44 %
Battery Voltage: 2.96 V
Brady Statistic AP VP Percent: 54 %
Brady Statistic AP VS Percent: 1 %
Brady Statistic AS VP Percent: 46 %
Brady Statistic AS VS Percent: 1 %
Brady Statistic RA Percent Paced: 54 %
Brady Statistic RV Percent Paced: 99 %
Date Time Interrogation Session: 20240226020013
Implantable Lead Connection Status: 753985
Implantable Lead Connection Status: 753985
Implantable Lead Implant Date: 20181126
Implantable Lead Implant Date: 20181126
Implantable Lead Location: 753859
Implantable Lead Location: 753860
Implantable Lead Model: 5076
Implantable Lead Model: 5076
Implantable Pulse Generator Implant Date: 20181126
Lead Channel Impedance Value: 440 Ohm
Lead Channel Impedance Value: 450 Ohm
Lead Channel Pacing Threshold Amplitude: 0.5 V
Lead Channel Pacing Threshold Amplitude: 0.875 V
Lead Channel Pacing Threshold Pulse Width: 0.5 ms
Lead Channel Pacing Threshold Pulse Width: 0.5 ms
Lead Channel Sensing Intrinsic Amplitude: 3.3 mV
Lead Channel Sensing Intrinsic Amplitude: 4.7 mV
Lead Channel Setting Pacing Amplitude: 1.125
Lead Channel Setting Pacing Amplitude: 2 V
Lead Channel Setting Pacing Pulse Width: 0.5 ms
Lead Channel Setting Sensing Sensitivity: 2 mV
Pulse Gen Model: 2272
Pulse Gen Serial Number: 8971004

## 2022-10-01 DIAGNOSIS — T23009A Burn of unspecified degree of unspecified hand, unspecified site, initial encounter: Secondary | ICD-10-CM | POA: Diagnosis not present

## 2022-10-01 DIAGNOSIS — T148XXA Other injury of unspecified body region, initial encounter: Secondary | ICD-10-CM | POA: Diagnosis not present

## 2022-10-22 DIAGNOSIS — M542 Cervicalgia: Secondary | ICD-10-CM | POA: Diagnosis not present

## 2022-10-23 ENCOUNTER — Other Ambulatory Visit: Payer: Self-pay | Admitting: Internal Medicine

## 2022-10-23 ENCOUNTER — Ambulatory Visit
Admission: RE | Admit: 2022-10-23 | Discharge: 2022-10-23 | Disposition: A | Discharge status: Home / Self Care | Payer: Medicare PPO | Source: Ambulatory Visit | Attending: Internal Medicine | Admitting: Internal Medicine

## 2022-10-23 DIAGNOSIS — M542 Cervicalgia: Secondary | ICD-10-CM

## 2022-10-23 DIAGNOSIS — M4312 Spondylolisthesis, cervical region: Secondary | ICD-10-CM | POA: Diagnosis not present

## 2022-10-30 NOTE — Progress Notes (Signed)
Remote pacemaker transmission.   

## 2022-11-06 DIAGNOSIS — N1831 Chronic kidney disease, stage 3a: Secondary | ICD-10-CM | POA: Diagnosis not present

## 2022-11-06 DIAGNOSIS — Z0000 Encounter for general adult medical examination without abnormal findings: Secondary | ICD-10-CM | POA: Diagnosis not present

## 2022-11-06 DIAGNOSIS — I10 Essential (primary) hypertension: Secondary | ICD-10-CM | POA: Diagnosis not present

## 2022-11-06 DIAGNOSIS — Z85528 Personal history of other malignant neoplasm of kidney: Secondary | ICD-10-CM | POA: Diagnosis not present

## 2022-11-06 DIAGNOSIS — F4321 Adjustment disorder with depressed mood: Secondary | ICD-10-CM | POA: Diagnosis not present

## 2022-11-06 DIAGNOSIS — R7301 Impaired fasting glucose: Secondary | ICD-10-CM | POA: Diagnosis not present

## 2022-11-06 DIAGNOSIS — Z85038 Personal history of other malignant neoplasm of large intestine: Secondary | ICD-10-CM | POA: Diagnosis not present

## 2022-11-06 DIAGNOSIS — M48061 Spinal stenosis, lumbar region without neurogenic claudication: Secondary | ICD-10-CM | POA: Diagnosis not present

## 2022-11-06 DIAGNOSIS — Z1331 Encounter for screening for depression: Secondary | ICD-10-CM | POA: Diagnosis not present

## 2022-11-06 DIAGNOSIS — N400 Enlarged prostate without lower urinary tract symptoms: Secondary | ICD-10-CM | POA: Diagnosis not present

## 2022-11-06 DIAGNOSIS — R946 Abnormal results of thyroid function studies: Secondary | ICD-10-CM | POA: Diagnosis not present

## 2022-11-06 DIAGNOSIS — Z950 Presence of cardiac pacemaker: Secondary | ICD-10-CM | POA: Diagnosis not present

## 2022-11-19 ENCOUNTER — Ambulatory Visit (INDEPENDENT_AMBULATORY_CARE_PROVIDER_SITE_OTHER): Payer: Medicare PPO | Admitting: Podiatry

## 2022-11-19 DIAGNOSIS — G63 Polyneuropathy in diseases classified elsewhere: Secondary | ICD-10-CM | POA: Diagnosis not present

## 2022-11-19 DIAGNOSIS — M79675 Pain in left toe(s): Secondary | ICD-10-CM

## 2022-11-19 DIAGNOSIS — M79674 Pain in right toe(s): Secondary | ICD-10-CM

## 2022-11-19 DIAGNOSIS — B351 Tinea unguium: Secondary | ICD-10-CM

## 2022-11-19 DIAGNOSIS — C801 Malignant (primary) neoplasm, unspecified: Secondary | ICD-10-CM

## 2022-11-19 DIAGNOSIS — Q828 Other specified congenital malformations of skin: Secondary | ICD-10-CM

## 2022-11-19 NOTE — Patient Instructions (Signed)
You can increase the pregablin for twice a day  --   Pregabalin Capsules What is this medication? PREGABALIN (pre GAB a lin) treats nerve pain. It may also be used to prevent and control seizures in people with epilepsy. It works by calming overactive nerves in your body. This medicine may be used for other purposes; ask your health care provider or pharmacist if you have questions. COMMON BRAND NAME(S): Lyrica What should I tell my care team before I take this medication? They need to know if you have any of these conditions: Heart failure Kidney disease Lung disease Substance use disorder Suicidal thoughts, plans or attempt by you or a family member An unusual or allergic reaction to pregabalin, other medications, foods, dyes, or preservatives Pregnant or trying to get pregnant Breast-feeding How should I use this medication? Take this medication by mouth with water. Take it as directed on the prescription label at the same time every day. You can take it with or without food. If it upsets your stomach, take it with food. Keep taking it unless your care team tells you to stop. A special MedGuide will be given to you by the pharmacist with each prescription and refill. Be sure to read this information carefully each time. Talk to your care team about the use of this medication in children. While it may be prescribed for children as young as 1 month for selected conditions, precautions do apply. Overdosage: If you think you have taken too much of this medicine contact a poison control center or emergency room at once. NOTE: This medicine is only for you. Do not share this medicine with others. What if I miss a dose? If you miss a dose, take it as soon as you can. If it is almost time for your next dose, take only that dose. Do not take double or extra doses. What may interact with this medication? This medication may interact with the following: Alcohol Antihistamines for allergy,  cough, and cold Certain medications for anxiety or sleep Certain medications for blood pressure, heart disease Certain medications for depression like amitriptyline, fluoxetine, sertraline Certain medications for diabetes, like pioglitazone, rosiglitazone Certain medications for seizures like phenobarbital, primidone General anesthetics like halothane, isoflurane, methoxyflurane, propofol Medications that relax muscles for surgery Opioid medications for pain Phenothiazines like chlorpromazine, mesoridazine, prochlorperazine, thioridazine This list may not describe all possible interactions. Give your health care provider a list of all the medicines, herbs, non-prescription drugs, or dietary supplements you use. Also tell them if you smoke, drink alcohol, or use illegal drugs. Some items may interact with your medicine. What should I watch for while using this medication? Visit your care team for regular checks on your progress. Tell your care team if your symptoms do not start to get better or if they get worse. Do not suddenly stop taking this medication. You may develop a severe reaction. Your care team will tell you how much medication to take. If your care team wants you to stop the medication, the dose may be slowly lowered over time to avoid any side effects. This medication may affect your coordination, reaction time, or judgment. Do not drive or operate machinery until you know how this medication affects you. Sit up or stand slowly to reduce the risk of dizzy or fainting spells. Drinking alcohol with this medication can increase the risk of these side effects. If you or your family notice any changes in your behavior, such as new or worsening depression, thoughts of  harming yourself, anxiety, other unusual or disturbing thoughts, or memory loss, call your care team right away. Wear a medical ID bracelet or chain if you are taking this medication for seizures. Carry a card that describes your  condition. List the medications and doses you take on the card. This medication may make it more difficult to father a child. Talk to your care team if you are concerned about your fertility. What side effects may I notice from receiving this medication? Side effects that you should report to your care team as soon as possible: Allergic reactions or angioedema--skin rash, itching, hives, swelling of the face, eyes, lips, tongue, arms, or legs, trouble swallowing or breathing Blurry vision Thoughts of suicide or self-harm, worsening mood, feelings of depression Trouble breathing Side effects that usually do not require medical attention (report to your care team if they continue or are bothersome): Dizziness Drowsiness Dry mouth Nausea Swelling of the ankles, feet, hands Vomiting Weight gain This list may not describe all possible side effects. Call your doctor for medical advice about side effects. You may report side effects to FDA at 1-800-FDA-1088. Where should I keep my medication? Keep out of the reach of children and pets. This medication can be abused. Keep it in a safe place to protect it from theft. Do not share it with anyone. It is only for you. Selling or giving away this medication is dangerous and against the law. Store at [employer] C (77 degrees F). Get rid of any unused medication after the expiration date. This medication may cause harm and death if it is taken by other adults, children, or pets. It is important to get rid of the medication as soon as you no longer need it, or it is expired. You can do this in two ways: Take the medication to a medication take-back program. Check with your pharmacy or law enforcement to find a location. If you cannot return the medication, check the label or package insert to see if the medication should be thrown out in the garbage or flushed down the toilet. If you are not sure, ask your care team. If it is safe to put it in the trash, take  the medication out of the container. Mix the medication with cat litter, dirt, coffee grounds, or other unwanted substance. Seal the mixture in a bag or container. Put it in the trash. NOTE: This sheet is a summary. It may not cover all possible information. If you have questions about this medicine, talk to your doctor, pharmacist, or health care provider.  2023 Elsevier/Gold Standard (2021-04-06 00:00:00)

## 2022-11-20 NOTE — Progress Notes (Signed)
Subjective: Chief Complaint  Patient presents with   Nail Problem    Thick painful toenails, 3 month follow up     81 year old male presents the office today for follow evaluation of callus as well as for thick, likely nails is not able to himself.  Has been using the miracle foot cream, moisturizer and the callus has not been as bad.  The nails do cause discomfort.  He also states the neuropathy has been acting up more.  Takes Lyrica 50 mg in the morning but he is having symptoms more at nighttime.   Objective: AAO x3, NAD DP/PT pulses palpable bilaterally, CRT less than 3 seconds Nails are hypertrophic, dystrophic, brittle, discolored, elongated 10. No surrounding redness or drainage. Tenderness nails 1-5 bilaterally.   Hyperkeratotic lesion right medial second toe as well as left submetatarsal 2 without any underlying ulceration drainage or signs of infection.  Bunions present bilaterally. No pain with calf compression, swelling, warmth, erythema  Assessment: Symptomatic onychomycosis, hyperkeratotic lesions, neuropathy  Plan: -All treatment options discussed with the patient including all alternatives, risks, complications.  -Sharply debrided the nails x10 without any complications or bleeding. -Hyperkeratotic lesion sharp debrided x2 without any complications or bleeding.  Recommend moisturizer daily. -Given increased neuropathy symptoms will increase Lyrica to 50 mg twice a day.  Discussed side effects.  Vivi Barrack DPM

## 2022-12-12 DIAGNOSIS — E039 Hypothyroidism, unspecified: Secondary | ICD-10-CM | POA: Diagnosis not present

## 2022-12-24 ENCOUNTER — Ambulatory Visit (INDEPENDENT_AMBULATORY_CARE_PROVIDER_SITE_OTHER): Payer: Medicare PPO

## 2022-12-24 DIAGNOSIS — I495 Sick sinus syndrome: Secondary | ICD-10-CM

## 2022-12-24 DIAGNOSIS — I442 Atrioventricular block, complete: Secondary | ICD-10-CM

## 2022-12-24 LAB — CUP PACEART REMOTE DEVICE CHECK
Battery Remaining Longevity: 44 mo
Battery Remaining Percentage: 41 %
Battery Voltage: 2.96 V
Brady Statistic AP VP Percent: 63 %
Brady Statistic AP VS Percent: 1 %
Brady Statistic AS VP Percent: 37 %
Brady Statistic AS VS Percent: 1 %
Brady Statistic RA Percent Paced: 63 %
Brady Statistic RV Percent Paced: 99 %
Date Time Interrogation Session: 20240528020013
Implantable Lead Connection Status: 753985
Implantable Lead Connection Status: 753985
Implantable Lead Implant Date: 20181126
Implantable Lead Implant Date: 20181126
Implantable Lead Location: 753859
Implantable Lead Location: 753860
Implantable Lead Model: 5076
Implantable Lead Model: 5076
Implantable Pulse Generator Implant Date: 20181126
Lead Channel Impedance Value: 440 Ohm
Lead Channel Impedance Value: 440 Ohm
Lead Channel Pacing Threshold Amplitude: 0.5 V
Lead Channel Pacing Threshold Amplitude: 0.875 V
Lead Channel Pacing Threshold Pulse Width: 0.5 ms
Lead Channel Pacing Threshold Pulse Width: 0.5 ms
Lead Channel Sensing Intrinsic Amplitude: 3.3 mV
Lead Channel Sensing Intrinsic Amplitude: 4.3 mV
Lead Channel Setting Pacing Amplitude: 1.125
Lead Channel Setting Pacing Amplitude: 2 V
Lead Channel Setting Pacing Pulse Width: 0.5 ms
Lead Channel Setting Sensing Sensitivity: 2 mV
Pulse Gen Model: 2272
Pulse Gen Serial Number: 8971004

## 2022-12-25 ENCOUNTER — Encounter: Payer: Self-pay | Admitting: Gastroenterology

## 2022-12-25 DIAGNOSIS — R946 Abnormal results of thyroid function studies: Secondary | ICD-10-CM | POA: Diagnosis not present

## 2023-01-15 NOTE — Progress Notes (Signed)
Remote pacemaker transmission.   

## 2023-02-18 ENCOUNTER — Ambulatory Visit: Payer: Medicare PPO

## 2023-02-18 ENCOUNTER — Ambulatory Visit (INDEPENDENT_AMBULATORY_CARE_PROVIDER_SITE_OTHER): Payer: Medicare PPO | Admitting: Podiatry

## 2023-02-18 ENCOUNTER — Ambulatory Visit (INDEPENDENT_AMBULATORY_CARE_PROVIDER_SITE_OTHER): Payer: Medicare PPO

## 2023-02-18 DIAGNOSIS — G63 Polyneuropathy in diseases classified elsewhere: Secondary | ICD-10-CM | POA: Diagnosis not present

## 2023-02-18 DIAGNOSIS — C801 Malignant (primary) neoplasm, unspecified: Secondary | ICD-10-CM | POA: Diagnosis not present

## 2023-02-18 DIAGNOSIS — R609 Edema, unspecified: Secondary | ICD-10-CM

## 2023-02-18 DIAGNOSIS — M79675 Pain in left toe(s): Secondary | ICD-10-CM

## 2023-02-18 DIAGNOSIS — B351 Tinea unguium: Secondary | ICD-10-CM | POA: Diagnosis not present

## 2023-02-18 DIAGNOSIS — Q828 Other specified congenital malformations of skin: Secondary | ICD-10-CM

## 2023-02-18 DIAGNOSIS — M79674 Pain in right toe(s): Secondary | ICD-10-CM

## 2023-02-18 DIAGNOSIS — L03115 Cellulitis of right lower limb: Secondary | ICD-10-CM | POA: Diagnosis not present

## 2023-02-18 MED ORDER — DOXYCYCLINE HYCLATE 100 MG PO TABS: 100.0000 mg | ORAL_TABLET | Freq: Two times a day (BID) | ORAL | 0 refills | Status: DC

## 2023-02-18 NOTE — Patient Instructions (Signed)
If there is any worsening you need to go to the ER or if you have any chest pain or shortness of breath

## 2023-02-19 ENCOUNTER — Ambulatory Visit (HOSPITAL_COMMUNITY)
Admission: RE | Admit: 2023-02-19 | Discharge: 2023-02-19 | Disposition: A | Discharge status: Home / Self Care | Payer: Medicare PPO | Source: Ambulatory Visit | Attending: Vascular Surgery | Admitting: Vascular Surgery

## 2023-02-19 DIAGNOSIS — R609 Edema, unspecified: Secondary | ICD-10-CM | POA: Insufficient documentation

## 2023-02-21 ENCOUNTER — Telehealth: Payer: Self-pay

## 2023-02-21 DIAGNOSIS — R600 Localized edema: Secondary | ICD-10-CM | POA: Diagnosis not present

## 2023-02-21 DIAGNOSIS — L0390 Cellulitis, unspecified: Secondary | ICD-10-CM | POA: Diagnosis not present

## 2023-02-21 NOTE — Telephone Encounter (Signed)
Patient called this morning. The swelling and redness are moving up his leg - he is taking the antibiotics prescribed on 7/23- I spoke with patient at length. Advised that he needs to go to the ED today. Explained that he could end up septic.Advised that he may need additional antibiotics or IV antibiotics to get the infection under control. Patient was hesitant at first, but eventually said he would go to the ED today. He asked if he would be able to drive to [facility] for a family reunion this weekend - Advised to ask the doctor at the ED after he is evaluated.

## 2023-02-23 NOTE — Progress Notes (Signed)
Subjective: Chief Complaint  Patient presents with   Nail Problem    Pt came in for a routine foot care but also stated that his feet has been swollen for the past week. They are sore and he does not know why.     82 year old male presents the office today for follow evaluation of callus as well as for thick, likely nails is not able to himself.  He also states that his right leg has been swollen.  He recently went to the beach and after sitting in the car he noticed his right leg becoming swollen.  Noticed a red spot as well.  No open lesions.  No injuries.  No recent treatment.   Objective: AAO x3, NAD DP/PT pulses palpable bilaterally, CRT less than 3 seconds Nails are hypertrophic, dystrophic, brittle, discolored, elongated 10. No surrounding redness or drainage. Tenderness nails 1-5 bilaterally.   Hyperkeratotic lesion right medial second toe as well as left submetatarsal 2 without any underlying ulceration drainage or signs of infection.  Bunions present bilaterally. There is edema present to the right leg with tenderness with calf compression.  There is a localized area of erythema along the distal portion of the leg without any drainage or pus or any fluctuation, crepitation, malodor.  Also edema noted to the left forefoot dorsally.  No erythema or warmth.  Assessment: Symptomatic onychomycosis, hyperkeratotic lesions, neuropathy; right leg swelling localized erythema  Plan: -All treatment options discussed with the patient including all alternatives, risks, complications.  -X-rays obtained and reviewed of the right ankle.  3 views were obtained of the right ankle and 3 views of the left foot.  Ulcerations present midfoot.  No evidence of acute fracture noted bilaterally. -Order venous duplex to rule out DVT of the right lower extremity. -Prescribe doxycycline. -Compression, elevation.  If symptoms worsen discussed emergency room or present chest pain or shortness of  breath. -Sharply debrided the nails x10 without any complications or bleeding. -Hyperkeratotic lesion sharp debrided x2 without any complications or bleeding.  Recommend moisturizer daily. -Continue Lyrica for neuropathy.  Vivi Barrack DPM

## 2023-02-25 DIAGNOSIS — M79669 Pain in unspecified lower leg: Secondary | ICD-10-CM | POA: Diagnosis not present

## 2023-02-25 DIAGNOSIS — I8310 Varicose veins of unspecified lower extremity with inflammation: Secondary | ICD-10-CM | POA: Diagnosis not present

## 2023-02-25 DIAGNOSIS — R600 Localized edema: Secondary | ICD-10-CM | POA: Diagnosis not present

## 2023-02-25 DIAGNOSIS — E8770 Fluid overload, unspecified: Secondary | ICD-10-CM | POA: Diagnosis not present

## 2023-02-26 ENCOUNTER — Ambulatory Visit (HOSPITAL_COMMUNITY)
Admission: RE | Admit: 2023-02-26 | Discharge: 2023-02-26 | Disposition: A | Discharge status: Home / Self Care | Payer: Medicare PPO | Source: Ambulatory Visit | Attending: Vascular Surgery | Admitting: Vascular Surgery

## 2023-02-26 ENCOUNTER — Other Ambulatory Visit (HOSPITAL_COMMUNITY): Payer: Self-pay | Admitting: Internal Medicine

## 2023-02-26 DIAGNOSIS — M79669 Pain in unspecified lower leg: Secondary | ICD-10-CM

## 2023-02-26 DIAGNOSIS — M79661 Pain in right lower leg: Secondary | ICD-10-CM | POA: Diagnosis not present

## 2023-02-28 ENCOUNTER — Ambulatory Visit: Payer: Medicare PPO | Admitting: Podiatry

## 2023-03-06 ENCOUNTER — Other Ambulatory Visit: Payer: Self-pay | Admitting: Podiatry

## 2023-03-06 ENCOUNTER — Telehealth: Payer: Self-pay | Admitting: Podiatry

## 2023-03-06 MED ORDER — PREGABALIN 50 MG PO CAPS: ORAL_CAPSULE | ORAL | 1 refills | Status: DC

## 2023-03-06 NOTE — Telephone Encounter (Signed)
Pt requests a Rx refill on Lyrica. Please advise

## 2023-03-11 ENCOUNTER — Ambulatory Visit (INDEPENDENT_AMBULATORY_CARE_PROVIDER_SITE_OTHER): Payer: Medicare PPO | Admitting: Podiatry

## 2023-03-11 DIAGNOSIS — R609 Edema, unspecified: Secondary | ICD-10-CM

## 2023-03-11 NOTE — Patient Instructions (Signed)
Achilles Tendinitis  with Rehab- start with these exercises to help strengthen your leg. If you want we can do physical therapy.  Achilles tendinitis is a disorder of the Achilles tendon. The Achilles tendon connects the large calf muscles (Gastrocnemius and Soleus) to the heel bone (calcaneus). This tendon is sometimes called the heel cord. It is important for pushing-off and standing on your toes and is important for walking, running, or jumping. Tendinitis is often caused by overuse and repetitive microtrauma. SYMPTOMS Pain, tenderness, swelling, warmth, and redness may occur over the Achilles tendon even at rest. Pain with pushing off, or flexing or extending the ankle. Pain that is worsened after or during activity. CAUSES  Overuse sometimes seen with rapid increase in exercise programs or in sports requiring running and jumping. Poor physical conditioning (strength and flexibility or endurance). Running sports, especially training running down hills. Inadequate warm-up before practice or play or failure to stretch before participation. Injury to the tendon. PREVENTION  Warm up and stretch before practice or competition. Allow time for adequate rest and recovery between practices and competition. Keep up conditioning. Keep up ankle and leg flexibility. Improve or keep muscle strength and endurance. Improve cardiovascular fitness. Use proper technique. Use proper equipment (shoes, skates). To help prevent recurrence, taping, protective strapping, or an adhesive bandage may be recommended for several weeks after healing is complete. PROGNOSIS  Recovery may take weeks to several months to heal. Longer recovery is expected if symptoms have been prolonged. Recovery is usually quicker if the inflammation is due to a direct blow as compared with overuse or sudden strain. RELATED COMPLICATIONS  Healing time will be prolonged if the condition is not correctly treated. The injury must be given  plenty of time to heal. Symptoms can reoccur if activity is resumed too soon. Untreated, tendinitis may increase the risk of tendon rupture requiring additional time for recovery and possibly surgery. TREATMENT  The first treatment consists of rest anti-inflammatory medication, and ice to relieve the pain. Stretching and strengthening exercises after resolution of pain will likely help reduce the risk of recurrence. Referral to a physical therapist or athletic trainer for further evaluation and treatment may be helpful. A walking boot or cast may be recommended to rest the Achilles tendon. This can help break the cycle of inflammation and microtrauma. Arch supports (orthotics) may be prescribed or recommended by your caregiver as an adjunct to therapy and rest. Surgery to remove the inflamed tendon lining or degenerated tendon tissue is rarely necessary and has shown less than predictable results. MEDICATION  Nonsteroidal anti-inflammatory medications, such as aspirin and ibuprofen, may be used for pain and inflammation relief. Do not take within 7 days before surgery. Take these as directed by your caregiver. Contact your caregiver immediately if any bleeding, stomach upset, or signs of allergic reaction occur. Other minor pain relievers, such as acetaminophen, may also be used. Pain relievers may be prescribed as necessary by your caregiver. Do not take prescription pain medication for longer than 4 to 7 days. Use only as directed and only as much as you need. Cortisone injections are rarely indicated. Cortisone injections may weaken tendons and predispose to rupture. It is better to give the condition more time to heal than to use them. HEAT AND COLD Cold is used to relieve pain and reduce inflammation for acute and chronic Achilles tendinitis. Cold should be applied for 10 to 15 minutes every 2 to 3 hours for inflammation and pain and immediately after any activity  that aggravates your symptoms.  Use ice packs or an ice massage. Heat may be used before performing stretching and strengthening activities prescribed by your caregiver. Use a heat pack or a warm soak. SEEK MEDICAL CARE IF: Symptoms get worse or do not improve in 2 weeks despite treatment. New, unexplained symptoms develop. Drugs used in treatment may produce side effects.  EXERCISES:  RANGE OF MOTION (ROM) AND STRETCHING EXERCISES - Achilles Tendinitis  These exercises may help you when beginning to rehabilitate your injury. Your symptoms may resolve with or without further involvement from your physician, physical therapist or athletic trainer. While completing these exercises, remember:  Restoring tissue flexibility helps normal motion to return to the joints. This allows healthier, less painful movement and activity. An effective stretch should be held for at least 30 seconds. A stretch should never be painful. You should only feel a gentle lengthening or release in the stretched tissue.  STRETCH  Gastroc, Standing  Place hands on wall. Extend right / left leg, keeping the front knee somewhat bent. Slightly point your toes inward on your back foot. Keeping your right / left heel on the floor and your knee straight, shift your weight toward the wall, not allowing your back to arch. You should feel a gentle stretch in the right / left calf. Hold this position for 10 seconds. Repeat 3 times. Complete this stretch 2 times per day.  STRETCH  Soleus, Standing  Place hands on wall. Extend right / left leg, keeping the other knee somewhat bent. Slightly point your toes inward on your back foot. Keep your right / left heel on the floor, bend your back knee, and slightly shift your weight over the back leg so that you feel a gentle stretch deep in your back calf. Hold this position for 10 seconds. Repeat 3 times. Complete this stretch 2 times per day.  STRETCH  Gastrocsoleus, Standing  Note: This exercise can place a lot  of stress on your foot and ankle. Please complete this exercise only if specifically instructed by your caregiver.  Place the ball of your right / left foot on a step, keeping your other foot firmly on the same step. Hold on to the wall or a rail for balance. Slowly lift your other foot, allowing your body weight to press your heel down over the edge of the step. You should feel a stretch in your right / left calf. Hold this position for 10 seconds. Repeat this exercise with a slight bend in your knee. Repeat 3 times. Complete this stretch 2 times per day.   STRENGTHENING EXERCISES - Achilles Tendinitis These exercises may help you when beginning to rehabilitate your injury. They may resolve your symptoms with or without further involvement from your physician, physical therapist or athletic trainer. While completing these exercises, remember:  Muscles can gain both the endurance and the strength needed for everyday activities through controlled exercises. Complete these exercises as instructed by your physician, physical therapist or athletic trainer. Progress the resistance and repetitions only as guided. You may experience muscle soreness or fatigue, but the pain or discomfort you are trying to eliminate should never worsen during these exercises. If this pain does worsen, stop and make certain you are following the directions exactly. If the pain is still present after adjustments, discontinue the exercise until you can discuss the trouble with your clinician.  STRENGTH - Plantar-flexors  Sit with your right / left leg extended. Holding onto both ends of a rubber  exercise band/tubing, loop it around the ball of your foot. Keep a slight tension in the band. Slowly push your toes away from you, pointing them downward. Hold this position for 10 seconds. Return slowly, controlling the tension in the band/tubing. Repeat 3 times. Complete this exercise 2 times per day.   STRENGTH - Plantar-flexors   Stand with your feet shoulder width apart. Steady yourself with a wall or table using as little support as needed. Keeping your weight evenly spread over the width of your feet, rise up on your toes.* Hold this position for 10 seconds. Repeat 3 times. Complete this exercise 2 times per day.  *If this is too easy, shift your weight toward your right / left leg until you feel challenged. Ultimately, you may be asked to do this exercise with your right / left foot only.  STRENGTH  Plantar-flexors, Eccentric  Note: This exercise can place a lot of stress on your foot and ankle. Please complete this exercise only if specifically instructed by your caregiver.  Place the balls of your feet on a step. With your hands, use only enough support from a wall or rail to keep your balance. Keep your knees straight and rise up on your toes. Slowly shift your weight entirely to your right / left toes and pick up your opposite foot. Gently and with controlled movement, lower your weight through your right / left foot so that your heel drops below the level of the step. You will feel a slight stretch in the back of your calf at the end position. Use the healthy leg to help rise up onto the balls of both feet, then lower weight only on the right / left leg again. Build up to 15 repetitions. Then progress to 3 consecutive sets of 15 repetitions.* After completing the above exercise, complete the same exercise with a slight knee bend (about 30 degrees). Again, build up to 15 repetitions. Then progress to 3 consecutive sets of 15 repetitions.* Perform this exercise 2 times per day.  *When you easily complete 3 sets of 15, your physician, physical therapist or athletic trainer may advise you to add resistance by wearing a backpack filled with additional weight.  STRENGTH - Plantar Flexors, Seated  Sit on a chair that allows your feet to rest flat on the ground. If necessary, sit at the edge of the chair. Keeping your  toes firmly on the ground, lift your right / left heel as far as you can without increasing any discomfort in your ankle. Repeat 3 times. Complete this exercise 2 times a day.

## 2023-03-11 NOTE — Progress Notes (Signed)
Subjective: No chief complaint on file.  82 year old male presents the office today for evaluation of swelling of the right side.  Since I saw him last he had a second venous duplex which was negative for DVT.  Swelling is improved as his primary has put him on furosemide.  Is wearing compression socks as well.  He states that he did not contact his dermatologist to help the discoloration in the legs.  Currently no open lesions or any drainage.  No fevers or chills.   Objective: AAO x3, NAD DP/PT pulses palpable bilaterally, CRT less than 3 seconds There is still edema present on the right leg but much improved compared to what it was.  There is some discoloration noted in the legs which likely is also venous insufficiency.  Area of erythema is improving as well and is more dark in color.  There is no drainage or pus or any increased temperature.  There are no open lesions. No pain with calf compression, swelling, warmth, erythema  Assessment: Right leg swelling  Plan: -All treatment options discussed with the patient including all alternatives, risks, complications.  -Will hold other antibiotics as it seems to be improving.  However if the redness with swelling were to recur we will likely restart antibiotics.  Continue furosemide as prescribed by his primary care doctor.  Discussed compression, elevation.  Discussed walking regularly but avoid excessive walking or standing. -Patient encouraged to call the office with any questions, concerns, change in symptoms.   Return in about 7 weeks (around 04/29/2023) for routine care, leg swelling.  Vivi Barrack DPM

## 2023-03-25 ENCOUNTER — Ambulatory Visit: Payer: Medicare PPO

## 2023-03-25 DIAGNOSIS — I442 Atrioventricular block, complete: Secondary | ICD-10-CM

## 2023-03-25 LAB — CUP PACEART REMOTE DEVICE CHECK
Battery Remaining Longevity: 43 mo
Battery Remaining Percentage: 39 %
Battery Voltage: 2.96 V
Brady Statistic AP VP Percent: 67 %
Brady Statistic AP VS Percent: 1 %
Brady Statistic AS VP Percent: 33 %
Brady Statistic AS VS Percent: 1 %
Brady Statistic RA Percent Paced: 67 %
Brady Statistic RV Percent Paced: 99 %
Date Time Interrogation Session: 20240827020014
Implantable Lead Connection Status: 753985
Implantable Lead Connection Status: 753985
Implantable Lead Implant Date: 20181126
Implantable Lead Implant Date: 20181126
Implantable Lead Location: 753859
Implantable Lead Location: 753860
Implantable Lead Model: 5076
Implantable Lead Model: 5076
Implantable Pulse Generator Implant Date: 20181126
Lead Channel Impedance Value: 440 Ohm
Lead Channel Impedance Value: 450 Ohm
Lead Channel Pacing Threshold Amplitude: 0.5 V
Lead Channel Pacing Threshold Amplitude: 0.875 V
Lead Channel Pacing Threshold Pulse Width: 0.5 ms
Lead Channel Pacing Threshold Pulse Width: 0.5 ms
Lead Channel Sensing Intrinsic Amplitude: 2.6 mV
Lead Channel Sensing Intrinsic Amplitude: 3.3 mV
Lead Channel Setting Pacing Amplitude: 1.125
Lead Channel Setting Pacing Amplitude: 2 V
Lead Channel Setting Pacing Pulse Width: 0.5 ms
Lead Channel Setting Sensing Sensitivity: 2 mV
Pulse Gen Model: 2272
Pulse Gen Serial Number: 8971004

## 2023-03-28 DIAGNOSIS — M7660 Achilles tendinitis, unspecified leg: Secondary | ICD-10-CM | POA: Diagnosis not present

## 2023-03-28 DIAGNOSIS — M48061 Spinal stenosis, lumbar region without neurogenic claudication: Secondary | ICD-10-CM | POA: Diagnosis not present

## 2023-03-28 DIAGNOSIS — R600 Localized edema: Secondary | ICD-10-CM | POA: Diagnosis not present

## 2023-03-28 DIAGNOSIS — L989 Disorder of the skin and subcutaneous tissue, unspecified: Secondary | ICD-10-CM | POA: Diagnosis not present

## 2023-03-28 DIAGNOSIS — M25519 Pain in unspecified shoulder: Secondary | ICD-10-CM | POA: Diagnosis not present

## 2023-04-02 NOTE — Progress Notes (Signed)
Remote pacemaker transmission.   

## 2023-04-17 ENCOUNTER — Ambulatory Visit (INDEPENDENT_AMBULATORY_CARE_PROVIDER_SITE_OTHER): Payer: Medicare PPO | Admitting: Physician Assistant

## 2023-04-17 ENCOUNTER — Encounter: Payer: Self-pay | Admitting: Physician Assistant

## 2023-04-17 VITALS — BP 140/70 | HR 72 | Ht 68.75 in | Wt 190.2 lb

## 2023-04-17 DIAGNOSIS — I442 Atrioventricular block, complete: Secondary | ICD-10-CM | POA: Diagnosis not present

## 2023-04-17 DIAGNOSIS — C189 Malignant neoplasm of colon, unspecified: Secondary | ICD-10-CM

## 2023-04-17 DIAGNOSIS — Z950 Presence of cardiac pacemaker: Secondary | ICD-10-CM | POA: Diagnosis not present
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
## 2023-04-17 NOTE — Patient Instructions (Signed)
We have contacted [place] , Dr Polite's office to obtain your most current labs to be faxed over to [place] for our review  We will contact you to schedule a colonoscopy if you are cleared by GI and Cardiology  Due to recent changes in healthcare laws, you may see the results of your imaging and laboratory studies on MyChart before your provider has had a chance to review them.  We understand that in some cases there may be results that are confusing or concerning to you. Not all laboratory results come back in the same time frame and the provider may be waiting for multiple results in order to interpret others.  Please give [place] 48 hours in order for your provider to thoroughly review all the results before contacting the office for clarification of your results.    _______________________________________________________  If your blood pressure at your visit was 140/90 or greater, please contact your primary care physician to follow up on this.  _______________________________________________________  If you are age 80 or older, your body mass index should be between 23-30. Your Body mass index is 28.3 kg/m. If this is out of the aforementioned range listed, please consider follow up with your Primary Care Provider.  If you are age 38 or younger, your body mass index should be between 19-25. Your Body mass index is 28.3 kg/m. If this is out of the aformentioned range listed, please consider follow up with your Primary Care Provider.   ________________________________________________________  The [facility] providers would like to encourage you to use [facility] to communicate with providers for non-urgent requests or questions.  Due to long hold times on the telephone, sending your provider a message by [facility] may be a faster and more efficient way to get a response.  Please allow 48 business hours for a response.  Please remember that this is for non-urgent requests.   _______________________________________________________   I appreciate the  opportunity to care for you  Thank You   Jacelyn Grip

## 2023-04-18 ENCOUNTER — Telehealth: Payer: Self-pay | Admitting: *Deleted

## 2023-04-18 DIAGNOSIS — Z85038 Personal history of other malignant neoplasm of large intestine: Secondary | ICD-10-CM

## 2023-04-18 NOTE — Telephone Encounter (Signed)
[facility] Pre-operative Risk Assessment     Request for surgical clearance:     Endoscopy Procedure  What type of surgery is being performed?     colonoscopy  When is this surgery scheduled?     TBD  What type of clearance is required ?   Cardiac clearance  Are there any medications that need to be held prior to surgery and how long? NO  Practice name and name of physician performing surgery?      [facility]  What is your office phone and fax number?      Phone- [phone]  Fax- [phone]  Anesthesia type (None, local, MAC, general) ?       MAC

## 2023-04-21 ENCOUNTER — Telehealth: Payer: Self-pay

## 2023-04-21 NOTE — Progress Notes (Signed)
____________________________________________________________  Attending physician addendum:  Thank you for sending this case to me. I have reviewed the entire note and agree with the plan.  While it is certainly reasonable to request cardiology evaluation prior to sedation for a colonoscopy, I think he is appropriate for a colonoscopy and sounds clinically stable since his pacemaker placement.  Amada Jupiter, MD  ____________________________________________________________

## 2023-04-21 NOTE — Telephone Encounter (Signed)
Pt is scheduled for 10/4 at 9:40am. Med rec and consent done.    Patient Consent for Virtual Visit        AMJED STRIKE has provided verbal consent on 04/21/2023 for a virtual visit (video or telephone).   CONSENT FOR VIRTUAL VISIT FOR:  KENTRAVIOUS BRUINSMA  By participating in this virtual visit I agree to the following:  I hereby voluntarily request, consent and authorize [facility] and its employed or contracted physicians, physician assistants, nurse practitioners or other licensed health care professionals (the Practitioner), to provide me with telemedicine health care services (the "Services") as deemed necessary by the treating Practitioner. I acknowledge and consent to receive the Services by the Practitioner via telemedicine. I understand that the telemedicine visit will involve communicating with the Practitioner through live audiovisual communication technology and the disclosure of certain medical information by electronic transmission. I acknowledge that I have been given the opportunity to request an in-person assessment or other available alternative prior to the telemedicine visit and am voluntarily participating in the telemedicine visit.  I understand that I have the right to withhold or withdraw my consent to the use of telemedicine in the course of my care at any time, without affecting my right to future care or treatment, and that the Practitioner or I may terminate the telemedicine visit at any time. I understand that I have the right to inspect all information obtained and/or recorded in the course of the telemedicine visit and may receive copies of available information for a reasonable fee.  I understand that some of the potential risks of receiving the Services via telemedicine include:  Delay or interruption in medical evaluation due to technological equipment failure or disruption; Information transmitted may not be sufficient (e.g. poor resolution of images) to  allow for appropriate medical decision making by the Practitioner; and/or  In rare instances, security protocols could fail, causing a breach of personal health information.  Furthermore, I acknowledge that it is my responsibility to provide information about my medical history, conditions and care that is complete and accurate to the best of my ability. I acknowledge that Practitioner's advice, recommendations, and/or decision may be based on factors not within their control, such as incomplete or inaccurate data provided by me or distortions of diagnostic images or specimens that may result from electronic transmissions. I understand that the practice of medicine is not an exact science and that Practitioner makes no warranties or guarantees regarding treatment outcomes. I acknowledge that a copy of this consent can be made available to me via my patient portal [facility] MyChart), or I can request a printed copy by calling the office of [facility].    I understand that my insurance will be billed for this visit.   I have read or had this consent read to me. I understand the contents of this consent, which adequately explains the benefits and risks of the Services being provided via telemedicine.  I have been provided ample opportunity to ask questions regarding this consent and the Services and have had my questions answered to my satisfaction. I give my informed consent for the services to be provided through the use of telemedicine in my medical care

## 2023-04-21 NOTE — Telephone Encounter (Signed)
Pt is scheduled for 10/4 at 9:40am. Med rec and consent done.

## 2023-04-21 NOTE — Telephone Encounter (Signed)
Name: Jorge Ross  DOB: 11/29/1940  MRN: [id number]  Primary Cardiologist: Sherryl Manges, MD   Preoperative team, please contact this patient and set up a phone call appointment for further preoperative risk assessment. Please obtain consent and complete medication review. Thank you for your help.  I confirm that guidance regarding antiplatelet and oral anticoagulation therapy has been completed and, if necessary, noted below.  None requested.   Patient has a device.    Carlos Levering, NP 04/21/2023, 1:03 PM [facility]

## 2023-04-29 ENCOUNTER — Ambulatory Visit: Payer: Medicare PPO | Admitting: Podiatry
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
## 2023-05-02 ENCOUNTER — Ambulatory Visit: Payer: Medicare PPO | Attending: Cardiology

## 2023-05-02 DIAGNOSIS — Z01818 Encounter for other preprocedural examination: Secondary | ICD-10-CM | POA: Diagnosis not present

## 2023-05-02 NOTE — Telephone Encounter (Signed)
Jorge Ross, The cardiac clearance you wanted but we never scheduled him. It looks like they just did a telephone visit with him today.Marland Kitchen

## 2023-05-05 ENCOUNTER — Encounter: Payer: Self-pay | Admitting: *Deleted

## 2023-05-05 MED ORDER — NA SULFATE-K SULFATE-MG SULF 17.5-3.13-1.6 GM/177ML PO SOLN: 1.0000 | Freq: Once | ORAL | 0 refills | Status: AC

## 2023-05-05 NOTE — Telephone Encounter (Signed)
Patient is scheduled for colonoscopy on 10/15 at 2:30 pm

## 2023-05-05 NOTE — Addendum Note (Signed)
Addended by: Marlowe Kays on: 05/05/2023 03:16 PM   Modules accepted: Orders

## 2023-05-13 ENCOUNTER — Ambulatory Visit: Payer: Medicare PPO | Admitting: Gastroenterology

## 2023-05-13 ENCOUNTER — Encounter: Payer: Self-pay | Admitting: Gastroenterology

## 2023-05-13 VITALS — BP 137/72 | HR 82 | Temp 98.6°F | Resp 15 | Ht 68.75 in | Wt 190.0 lb

## 2023-05-13 DIAGNOSIS — Z08 Encounter for follow-up examination after completed treatment for malignant neoplasm: Secondary | ICD-10-CM

## 2023-05-13 DIAGNOSIS — Z85038 Personal history of other malignant neoplasm of large intestine: Secondary | ICD-10-CM | POA: Diagnosis not present

## 2023-05-13 DIAGNOSIS — D123 Benign neoplasm of transverse colon: Secondary | ICD-10-CM | POA: Diagnosis not present

## 2023-05-13 DIAGNOSIS — K635 Polyp of colon: Secondary | ICD-10-CM | POA: Diagnosis not present

## 2023-05-13 DIAGNOSIS — D125 Benign neoplasm of sigmoid colon: Secondary | ICD-10-CM | POA: Diagnosis not present

## 2023-05-13 DIAGNOSIS — I10 Essential (primary) hypertension: Secondary | ICD-10-CM | POA: Diagnosis not present

## 2023-05-13 DIAGNOSIS — Z1211 Encounter for screening for malignant neoplasm of colon: Secondary | ICD-10-CM | POA: Diagnosis not present

## 2023-05-13 MED ORDER — SODIUM CHLORIDE 0.9 % IV SOLN: 500.0000 mL | Freq: Once | INTRAVENOUS | Status: DC

## 2023-05-13 NOTE — Progress Notes (Signed)
Sedate, gd SR, tolerated procedure well, VSS, report to RN 

## 2023-05-13 NOTE — Patient Instructions (Signed)
Resume previous diet Continue present medications Await pathology results You will NOT need another screening colonoscopy!! HOWEVER....   Please call us at [phone] if you have a change in bowel habits, change in family history of colo-rectal cancer, rectal bleeding or other GI concern in the future!!!  Handouts/information given for polyps and hemorrhoids  YOU HAD AN ENDOSCOPIC PROCEDURE TODAY AT THE [facility]:   Refer to the procedure report that was given to you for any specific questions about what was found during the examination.  If the procedure report does not answer your questions, please call your gastroenterologist to clarify.  If you requested that your care partner not be given the details of your procedure findings, then the procedure report has been included in a sealed envelope for you to review at your convenience later.  YOU SHOULD EXPECT: Some feelings of bloating in the abdomen. Passage of more gas than usual.  Walking can help get rid of the air that was put into your GI tract during the procedure and reduce the bloating. If you had a lower endoscopy (such as a colonoscopy or flexible sigmoidoscopy) you may notice spotting of blood in your stool or on the toilet paper. If you underwent a bowel prep for your procedure, you may not have a normal bowel movement for a few days.  Please Note:  You might notice some irritation and congestion in your nose or some drainage.  This is from the oxygen used during your procedure.  There is no need for concern and it should clear up in a day or so.  SYMPTOMS TO REPORT IMMEDIATELY:  Following lower endoscopy (colonoscopy):  Excessive amounts of blood in the stool  Significant tenderness or worsening of abdominal pains  Swelling of the abdomen that is new, acute  Fever of 100F or higher  For urgent or emergent issues, a gastroenterologist can be reached at any hour by calling [phone]. Do not use MyChart  messaging for urgent concerns.   DIET:  We do recommend a small meal at first, but then you may proceed to your regular diet.  Drink plenty of fluids but you should avoid alcoholic beverages for 24 hours.  ACTIVITY:  You should plan to take it easy for the rest of today and you should NOT DRIVE or use heavy machinery until tomorrow (because of the sedation medicines used during the test).    FOLLOW UP: Our staff will call the number listed on your records the next business day following your procedure.  We will call around 7:15- 8:00 am to check on you and address any questions or concerns that you may have regarding the information given to you following your procedure. If we do not reach you, we will leave a message.     If any biopsies were taken you will be contacted by phone or by letter within the next 1-3 weeks.  Please call us at [phone] if you have not heard about the biopsies in 3 weeks.   SIGNATURES/CONFIDENTIALITY: You and/or your care partner have signed paperwork which will be entered into your electronic medical record.  These signatures attest to the fact that that the information above on your After Visit Summary has been reviewed and is understood.  Full responsibility of the confidentiality of this discharge information lies with you and/or your care-partner.

## 2023-05-13 NOTE — Progress Notes (Signed)
Called to room to assist during endoscopic procedure.  Patient ID and intended procedure confirmed with present staff. Received instructions for my participation in the procedure from the performing physician.  

## 2023-05-13 NOTE — Progress Notes (Signed)
No significant changes to clinical history since GI office visit on 04/17/23.  The patient is appropriate for an endoscopic procedure in the ambulatory setting.  - Amada Jupiter, MD

## 2023-05-13 NOTE — Progress Notes (Signed)
Pt's states no medical or surgical changes since previsit or office visit. 

## 2023-05-13 NOTE — Op Note (Signed)
[facility] Patient Name: Jorge Ross Procedure Date: 05/13/2023 2:24 PM MRN: [id number] Endoscopist: Sherilyn Cooter L. Myrtie Neither , MD, [id number] Age: 82 Referring MD:  Date of Birth: 17-Apr-1941 Gender: Male Account #: [id number] Procedure:                Colonoscopy Indications:              High risk colon cancer surveillance: Personal                            history of colon cancer                           Right hemicolectomy for colonic adenocarcinoma                            2015; no polyps on surveillance colonoscopy in 2020 Medicines:                Monitored Anesthesia Care Procedure:                Pre-Anesthesia Assessment:                           - Prior to the procedure, a History and Physical                            was performed, and patient medications and                            allergies were reviewed. The patient's tolerance of                            previous anesthesia was also reviewed. The risks                            and benefits of the procedure and the sedation                            options and risks were discussed with the patient.                            All questions were answered, and informed consent                            was obtained. Prior Anticoagulants: The patient has                            taken no anticoagulant or antiplatelet agents. ASA                            Grade Assessment: III - A patient with severe                            systemic disease. After reviewing the risks and  benefits, the patient was deemed in satisfactory                            condition to undergo the procedure.                           After obtaining informed consent, the colonoscope                            was passed under direct vision. Throughout the                            procedure, the patient's blood pressure, pulse, and                            oxygen saturations were monitored  continuously. The                            Olympus Scope [id number] was introduced through the                            anus and advanced to the the ileocolonic                            anastomosis and then into the neo-TI. The                            colonoscopy was performed without difficulty. The                            patient tolerated the procedure well. The quality                            of the bowel preparation was excellent. The                            terminal ileum, the rectum and ileo-colonic                            anastomosis were photographed. Scope In: 2:48:38 PM Scope Out: 2:59:17 PM Scope Withdrawal Time: 0 hours 8 minutes 21 seconds  Total Procedure Duration: 0 hours 10 minutes 39 seconds  Findings:                 The perianal and digital rectal examinations were                            normal.                           There was evidence of a prior side-to-side                            ileo-colonic anastomosis in the proximal transverse  colon. This was patent and was characterized by                            healthy appearing mucosa. The anastomosis was                            traversed.                           The neo-terminal ileum appeared normal.                           Three sessile and semi-sessile polyps were found in                            the sigmoid colon and transverse colon. The polyps                            were 4 to 8 mm in size. These polyps were removed                            with a cold snare. Resection and retrieval were                            complete.                           Internal hemorrhoids were found.                           The exam was otherwise without abnormality on                            direct and retroflexion views. Complications:            No immediate complications. Estimated Blood Loss:     Estimated blood loss was minimal. Impression:                - Patent side-to-side ileo-colonic anastomosis,                            characterized by healthy appearing mucosa.                           - The examined portion of the ileum was normal.                           - Three 4 to 8 mm polyps in the sigmoid colon and                            in the transverse colon, removed with a cold snare.                            Resected and retrieved.                           -  Internal hemorrhoids.                           - The examination was otherwise normal on direct                            and retroflexion views. Recommendation:           - Patient has a contact number available for                            emergencies. The signs and symptoms of potential                            delayed complications were discussed with the                            patient. Return to normal activities tomorrow.                            Written discharge instructions were provided to the                            patient.                           - Resume previous diet.                           - Continue present medications.                           - Await pathology results.                           - No repeat routine surveillance colonoscopy                            recommended due to age, current guidelines and                            today's findings. Ashling Roane L. Myrtie Neither, MD 05/13/2023 3:08:34 PM This report has been signed electronically.

## 2023-05-14 ENCOUNTER — Telehealth: Payer: Self-pay | Admitting: *Deleted

## 2023-05-14 NOTE — Telephone Encounter (Signed)
  Follow up Call-     05/13/2023    1:59 PM  Call back number  Post procedure Call Back phone  # [phone]  Permission to leave phone message Yes     Patient questions:  Do you have a fever, pain , or abdominal swelling? No. Pain Score  0 *  Have you tolerated food without any problems? Yes.    Have you been able to return to your normal activities? Yes.    Do you have any questions about your discharge instructions: Diet   No. Medications  No. Follow up visit  No.  Do you have questions or concerns about your Care? No.  Actions: * If pain score is 4 or above: No action needed, pain <4.

## 2023-05-16 LAB — SURGICAL PATHOLOGY

## 2023-05-21 ENCOUNTER — Encounter: Payer: Self-pay | Admitting: Gastroenterology

## 2023-06-09 ENCOUNTER — Telehealth: Payer: Self-pay | Admitting: Podiatry

## 2023-06-09 MED ORDER — PREGABALIN 50 MG PO CAPS: ORAL_CAPSULE | ORAL | 1 refills | Status: DC

## 2023-06-09 NOTE — Telephone Encounter (Signed)
Patient called in regards to getting a medication refill could you please send that in for him it should be for pregabalin (LYRICA) 50 MG capsule [[id number]]   Thanks

## 2023-06-12 DIAGNOSIS — C44729 Squamous cell carcinoma of skin of left lower limb, including hip: Secondary | ICD-10-CM | POA: Diagnosis not present

## 2023-06-12 DIAGNOSIS — D492 Neoplasm of unspecified behavior of bone, soft tissue, and skin: Secondary | ICD-10-CM | POA: Diagnosis not present

## 2023-06-12 DIAGNOSIS — I872 Venous insufficiency (chronic) (peripheral): Secondary | ICD-10-CM | POA: Diagnosis not present

## 2023-06-23 ENCOUNTER — Encounter: Payer: Self-pay | Admitting: Podiatry

## 2023-06-23 ENCOUNTER — Ambulatory Visit: Payer: Medicare PPO | Admitting: Podiatry

## 2023-06-23 DIAGNOSIS — C801 Malignant (primary) neoplasm, unspecified: Secondary | ICD-10-CM | POA: Diagnosis not present

## 2023-06-23 DIAGNOSIS — Q828 Other specified congenital malformations of skin: Secondary | ICD-10-CM | POA: Diagnosis not present

## 2023-06-23 DIAGNOSIS — M79675 Pain in left toe(s): Secondary | ICD-10-CM | POA: Diagnosis not present

## 2023-06-23 DIAGNOSIS — M79674 Pain in right toe(s): Secondary | ICD-10-CM

## 2023-06-23 DIAGNOSIS — B351 Tinea unguium: Secondary | ICD-10-CM | POA: Diagnosis not present

## 2023-06-23 DIAGNOSIS — G63 Polyneuropathy in diseases classified elsewhere: Secondary | ICD-10-CM

## 2023-06-24 ENCOUNTER — Ambulatory Visit: Payer: Medicare PPO

## 2023-06-24 ENCOUNTER — Ambulatory Visit: Payer: Medicare PPO | Admitting: Podiatry

## 2023-06-24 DIAGNOSIS — R55 Syncope and collapse: Secondary | ICD-10-CM

## 2023-06-24 DIAGNOSIS — I442 Atrioventricular block, complete: Secondary | ICD-10-CM | POA: Diagnosis not present

## 2023-06-24 LAB — CUP PACEART REMOTE DEVICE CHECK
Battery Remaining Longevity: 37 mo
Battery Remaining Percentage: 36 %
Battery Voltage: 2.95 V
Brady Statistic AP VP Percent: 68 %
Brady Statistic AP VS Percent: 1 %
Brady Statistic AS VP Percent: 32 %
Brady Statistic AS VS Percent: 1 %
Brady Statistic RA Percent Paced: 68 %
Brady Statistic RV Percent Paced: 99 %
Date Time Interrogation Session: 20241126020035
Implantable Lead Connection Status: 753985
Implantable Lead Connection Status: 753985
Implantable Lead Implant Date: 20181126
Implantable Lead Implant Date: 20181126
Implantable Lead Location: 753859
Implantable Lead Location: 753860
Implantable Lead Model: 5076
Implantable Lead Model: 5076
Implantable Pulse Generator Implant Date: 20181126
Lead Channel Impedance Value: 430 Ohm
Lead Channel Impedance Value: 440 Ohm
Lead Channel Pacing Threshold Amplitude: 0.5 V
Lead Channel Pacing Threshold Amplitude: 0.875 V
Lead Channel Pacing Threshold Pulse Width: 0.5 ms
Lead Channel Pacing Threshold Pulse Width: 0.5 ms
Lead Channel Sensing Intrinsic Amplitude: 3.3 mV
Lead Channel Sensing Intrinsic Amplitude: 4 mV
Lead Channel Setting Pacing Amplitude: 1.125
Lead Channel Setting Pacing Amplitude: 2 V
Lead Channel Setting Pacing Pulse Width: 0.5 ms
Lead Channel Setting Sensing Sensitivity: 2 mV
Pulse Gen Model: 2272
Pulse Gen Serial Number: 8971004

## 2023-06-25 NOTE — Progress Notes (Signed)
Subjective: Chief Complaint  Patient presents with   Debridement    Trim toenails/calluses/check swelling in legs and neuropathy - says not much change     82 year old male presents the office today for follow evaluation of callus as well as for thick, likely nails is not able to himself.  Swelling has been better.  He recently developed an ulcer on the lateral aspect the left leg.  This summer he states he had a burn out many years ago but recently opened up.  He is seeing dermatology and was told it was cancerous and needs to be scheduled to be removed.     Objective: AAO x3, NAD DP/PT pulses palpable bilaterally, CRT less than 3 seconds Nails are hypertrophic, dystrophic, brittle, discolored, elongated 10. No surrounding redness or drainage. Tenderness nails 1-5 bilaterally.   Hyperkeratotic lesion right medial second toe as well as left submetatarsal 2 without any underlying ulceration drainage or signs of infection.  Bunions present bilaterally. Sorry to be stable.  There is a ulceration of the lateral aspect left calf which is new.  He is currently being seen by dermatology for this.  Assessment: Symptomatic onychomycosis, hyperkeratotic lesions, neuropathy; right leg swelling localized erythema  Plan: -All treatment options discussed with the patient including all alternatives, risks, complications.  -Sharply debrided the nails x10 without any complications or bleeding. -Hyperkeratotic lesion sharp debrided x2 without any complications or bleeding.  Recommend moisturizer daily. -Continue Lyrica for neuropathy. -Follow-up with dermatology for left leg ulcer  Vivi Barrack DPM

## 2023-07-17 DIAGNOSIS — M542 Cervicalgia: Secondary | ICD-10-CM | POA: Diagnosis not present

## 2023-07-17 DIAGNOSIS — R5383 Other fatigue: Secondary | ICD-10-CM | POA: Diagnosis not present

## 2023-07-18 DIAGNOSIS — C44729 Squamous cell carcinoma of skin of left lower limb, including hip: Secondary | ICD-10-CM | POA: Diagnosis not present

## 2023-07-18 DIAGNOSIS — L821 Other seborrheic keratosis: Secondary | ICD-10-CM | POA: Diagnosis not present

## 2023-07-18 DIAGNOSIS — D485 Neoplasm of uncertain behavior of skin: Secondary | ICD-10-CM | POA: Diagnosis not present

## 2023-07-24 NOTE — Progress Notes (Signed)
Remote pacemaker transmission.   

## 2023-08-04 DIAGNOSIS — R3915 Urgency of urination: Secondary | ICD-10-CM | POA: Diagnosis not present

## 2023-08-04 DIAGNOSIS — C642 Malignant neoplasm of left kidney, except renal pelvis: Secondary | ICD-10-CM | POA: Diagnosis not present

## 2023-08-04 DIAGNOSIS — N281 Cyst of kidney, acquired: Secondary | ICD-10-CM | POA: Diagnosis not present

## 2023-08-04 DIAGNOSIS — N5201 Erectile dysfunction due to arterial insufficiency: Secondary | ICD-10-CM | POA: Diagnosis not present

## 2023-08-13 DIAGNOSIS — D2321 Other benign neoplasm of skin of right ear and external auricular canal: Secondary | ICD-10-CM | POA: Diagnosis not present

## 2023-08-13 DIAGNOSIS — D485 Neoplasm of uncertain behavior of skin: Secondary | ICD-10-CM | POA: Diagnosis not present

## 2023-08-13 DIAGNOSIS — I872 Venous insufficiency (chronic) (peripheral): Secondary | ICD-10-CM | POA: Diagnosis not present

## 2023-08-13 DIAGNOSIS — C44729 Squamous cell carcinoma of skin of left lower limb, including hip: Secondary | ICD-10-CM | POA: Diagnosis not present

## 2023-08-25 ENCOUNTER — Ambulatory Visit: Payer: Medicare PPO | Admitting: Podiatry

## 2023-08-25 DIAGNOSIS — S81802A Unspecified open wound, left lower leg, initial encounter: Secondary | ICD-10-CM | POA: Diagnosis not present

## 2023-09-03 DIAGNOSIS — W19XXXA Unspecified fall, initial encounter: Secondary | ICD-10-CM | POA: Diagnosis not present

## 2023-09-03 DIAGNOSIS — M542 Cervicalgia: Secondary | ICD-10-CM | POA: Diagnosis not present

## 2023-09-03 DIAGNOSIS — S0990XA Unspecified injury of head, initial encounter: Secondary | ICD-10-CM | POA: Diagnosis not present

## 2023-09-03 DIAGNOSIS — L989 Disorder of the skin and subcutaneous tissue, unspecified: Secondary | ICD-10-CM | POA: Diagnosis not present

## 2023-09-08 ENCOUNTER — Encounter: Payer: Self-pay | Admitting: Podiatry

## 2023-09-08 ENCOUNTER — Ambulatory Visit: Payer: Medicare PPO | Admitting: Podiatry

## 2023-09-08 DIAGNOSIS — B351 Tinea unguium: Secondary | ICD-10-CM | POA: Diagnosis not present

## 2023-09-08 DIAGNOSIS — Q828 Other specified congenital malformations of skin: Secondary | ICD-10-CM | POA: Diagnosis not present

## 2023-09-08 DIAGNOSIS — M79674 Pain in right toe(s): Secondary | ICD-10-CM | POA: Diagnosis not present

## 2023-09-08 DIAGNOSIS — G63 Polyneuropathy in diseases classified elsewhere: Secondary | ICD-10-CM | POA: Diagnosis not present

## 2023-09-08 DIAGNOSIS — M79675 Pain in left toe(s): Secondary | ICD-10-CM

## 2023-09-08 DIAGNOSIS — C801 Malignant (primary) neoplasm, unspecified: Secondary | ICD-10-CM | POA: Diagnosis not present

## 2023-09-08 NOTE — Progress Notes (Signed)
 Subjective: Chief Complaint  Patient presents with   RFC    RM#13 RFC patient concerned about redness on legs.     83 year old male presents the office today for follow evaluation of callus as well as for thick, likely nails is not able to trim himself.  Some neuropathy symptoms but has not been progressing.  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  He has chronic discoloration to his leg he has not had any worsening he reports.   Objective: AAO x3, NAD DP/PT pulses palpable bilaterally, CRT less than 3 seconds Nails are hypertrophic, dystrophic, brittle, discolored, elongated 10. No surrounding redness or drainage. Tenderness nails 1-5 bilaterally.   Hyperkeratotic lesion right medial second toe as well as bilateral submetatarsal 2 without any underlying ulceration drainage or signs of infection.  Bunions present bilaterally. Sorry to be stable.  There is a ulceration of the lateral aspect left calf which is new.  He is currently being seen by dermatology for this.  Assessment: Symptomatic onychomycosis, hyperkeratotic lesions, neuropathy  Plan: -All treatment options discussed with the patient including all alternatives, risks, complications.  -Sharply debrided the nails x10 without any complications or bleeding. -Hyperkeratotic lesion sharp debrided x2 submetatarsal without any complications or bleeding.  Recommend moisturizer daily. -Continue Lyrica  for neuropathy. -Will defer to the wound care center, dermatology for the leg wound.  Charity Conch DPM

## 2023-09-18 DIAGNOSIS — S81802A Unspecified open wound, left lower leg, initial encounter: Secondary | ICD-10-CM | POA: Diagnosis not present

## 2023-09-23 ENCOUNTER — Ambulatory Visit (INDEPENDENT_AMBULATORY_CARE_PROVIDER_SITE_OTHER): Payer: Medicare PPO

## 2023-09-23 DIAGNOSIS — L821 Other seborrheic keratosis: Secondary | ICD-10-CM | POA: Diagnosis not present

## 2023-09-23 DIAGNOSIS — D225 Melanocytic nevi of trunk: Secondary | ICD-10-CM | POA: Diagnosis not present

## 2023-09-23 DIAGNOSIS — I442 Atrioventricular block, complete: Secondary | ICD-10-CM | POA: Diagnosis not present

## 2023-09-23 DIAGNOSIS — L814 Other melanin hyperpigmentation: Secondary | ICD-10-CM | POA: Diagnosis not present

## 2023-09-24 LAB — CUP PACEART REMOTE DEVICE CHECK
Battery Remaining Longevity: 35 mo
Battery Remaining Percentage: 33 %
Battery Voltage: 2.95 V
Brady Statistic AP VP Percent: 68 %
Brady Statistic AP VS Percent: 1 %
Brady Statistic AS VP Percent: 31 %
Brady Statistic AS VS Percent: 1 %
Brady Statistic RA Percent Paced: 69 %
Brady Statistic RV Percent Paced: 99 %
Date Time Interrogation Session: 20250225020016
Implantable Lead Connection Status: 753985
Implantable Lead Connection Status: 753985
Implantable Lead Implant Date: 20181126
Implantable Lead Implant Date: 20181126
Implantable Lead Location: 753859
Implantable Lead Location: 753860
Implantable Lead Model: 5076
Implantable Lead Model: 5076
Implantable Pulse Generator Implant Date: 20181126
Lead Channel Impedance Value: 430 Ohm
Lead Channel Impedance Value: 450 Ohm
Lead Channel Pacing Threshold Amplitude: 0.5 V
Lead Channel Pacing Threshold Amplitude: 0.875 V
Lead Channel Pacing Threshold Pulse Width: 0.5 ms
Lead Channel Pacing Threshold Pulse Width: 0.5 ms
Lead Channel Sensing Intrinsic Amplitude: 3.3 mV
Lead Channel Sensing Intrinsic Amplitude: 3.3 mV
Lead Channel Setting Pacing Amplitude: 1.125
Lead Channel Setting Pacing Amplitude: 2 V
Lead Channel Setting Pacing Pulse Width: 0.5 ms
Lead Channel Setting Sensing Sensitivity: 2 mV
Pulse Gen Model: 2272
Pulse Gen Serial Number: 8971004

## 2023-09-25 ENCOUNTER — Encounter (HOSPITAL_BASED_OUTPATIENT_CLINIC_OR_DEPARTMENT_OTHER): Payer: Medicare PPO | Attending: General Surgery | Admitting: General Surgery

## 2023-09-25 DIAGNOSIS — L97822 Non-pressure chronic ulcer of other part of left lower leg with fat layer exposed: Secondary | ICD-10-CM | POA: Insufficient documentation

## 2023-09-25 DIAGNOSIS — Y838 Other surgical procedures as the cause of abnormal reaction of the patient, or of later complication, without mention of misadventure at the time of the procedure: Secondary | ICD-10-CM | POA: Diagnosis not present

## 2023-09-25 DIAGNOSIS — T8131XS Disruption of external operation (surgical) wound, not elsewhere classified, sequela: Secondary | ICD-10-CM | POA: Insufficient documentation

## 2023-09-25 DIAGNOSIS — T8131XA Disruption of external operation (surgical) wound, not elsewhere classified, initial encounter: Secondary | ICD-10-CM | POA: Diagnosis present

## 2023-09-25 DIAGNOSIS — T81328A Disruption or dehiscence of closure of other specified internal operation (surgical) wound, initial encounter: Secondary | ICD-10-CM | POA: Diagnosis not present

## 2023-09-25 DIAGNOSIS — Z85828 Personal history of other malignant neoplasm of skin: Secondary | ICD-10-CM | POA: Diagnosis not present

## 2023-09-26 DIAGNOSIS — L97822 Non-pressure chronic ulcer of other part of left lower leg with fat layer exposed: Secondary | ICD-10-CM | POA: Diagnosis not present

## 2023-10-01 ENCOUNTER — Encounter (HOSPITAL_BASED_OUTPATIENT_CLINIC_OR_DEPARTMENT_OTHER): Payer: Medicare PPO | Attending: General Surgery | Admitting: General Surgery

## 2023-10-01 DIAGNOSIS — T8131XA Disruption of external operation (surgical) wound, not elsewhere classified, initial encounter: Secondary | ICD-10-CM | POA: Insufficient documentation

## 2023-10-01 DIAGNOSIS — L97822 Non-pressure chronic ulcer of other part of left lower leg with fat layer exposed: Secondary | ICD-10-CM | POA: Insufficient documentation

## 2023-10-01 DIAGNOSIS — T81328A Disruption or dehiscence of closure of other specified internal operation (surgical) wound, initial encounter: Secondary | ICD-10-CM | POA: Diagnosis not present

## 2023-10-01 DIAGNOSIS — Y838 Other surgical procedures as the cause of abnormal reaction of the patient, or of later complication, without mention of misadventure at the time of the procedure: Secondary | ICD-10-CM | POA: Diagnosis not present

## 2023-10-03 ENCOUNTER — Other Ambulatory Visit (HOSPITAL_COMMUNITY): Payer: Self-pay | Admitting: Internal Medicine

## 2023-10-03 DIAGNOSIS — Z9889 Other specified postprocedural states: Secondary | ICD-10-CM | POA: Diagnosis not present

## 2023-10-03 DIAGNOSIS — I10 Essential (primary) hypertension: Secondary | ICD-10-CM | POA: Diagnosis not present

## 2023-10-03 DIAGNOSIS — Z981 Arthrodesis status: Secondary | ICD-10-CM | POA: Diagnosis not present

## 2023-10-03 DIAGNOSIS — M542 Cervicalgia: Secondary | ICD-10-CM | POA: Diagnosis not present

## 2023-10-03 DIAGNOSIS — W19XXXD Unspecified fall, subsequent encounter: Secondary | ICD-10-CM | POA: Diagnosis not present

## 2023-10-03 DIAGNOSIS — S0990XD Unspecified injury of head, subsequent encounter: Secondary | ICD-10-CM | POA: Diagnosis not present

## 2023-10-03 DIAGNOSIS — R519 Headache, unspecified: Secondary | ICD-10-CM

## 2023-10-03 DIAGNOSIS — M5412 Radiculopathy, cervical region: Secondary | ICD-10-CM | POA: Diagnosis not present

## 2023-10-06 ENCOUNTER — Other Ambulatory Visit (HOSPITAL_COMMUNITY): Payer: Self-pay | Admitting: Internal Medicine

## 2023-10-06 ENCOUNTER — Encounter (HOSPITAL_COMMUNITY): Payer: Self-pay

## 2023-10-06 ENCOUNTER — Ambulatory Visit (HOSPITAL_COMMUNITY)
Admission: RE | Admit: 2023-10-06 | Discharge: 2023-10-06 | Disposition: A | Discharge status: Home / Self Care | Source: Ambulatory Visit | Attending: Internal Medicine | Admitting: Internal Medicine

## 2023-10-06 DIAGNOSIS — M542 Cervicalgia: Secondary | ICD-10-CM

## 2023-10-06 DIAGNOSIS — R519 Headache, unspecified: Secondary | ICD-10-CM | POA: Insufficient documentation

## 2023-10-06 DIAGNOSIS — R9089 Other abnormal findings on diagnostic imaging of central nervous system: Secondary | ICD-10-CM | POA: Diagnosis not present

## 2023-10-06 DIAGNOSIS — W19XXXA Unspecified fall, initial encounter: Secondary | ICD-10-CM | POA: Diagnosis not present

## 2023-10-06 DIAGNOSIS — S0990XA Unspecified injury of head, initial encounter: Secondary | ICD-10-CM | POA: Diagnosis not present

## 2023-10-06 DIAGNOSIS — S0990XD Unspecified injury of head, subsequent encounter: Secondary | ICD-10-CM

## 2023-10-06 DIAGNOSIS — W19XXXD Unspecified fall, subsequent encounter: Secondary | ICD-10-CM

## 2023-10-06 DIAGNOSIS — R200 Anesthesia of skin: Secondary | ICD-10-CM | POA: Diagnosis not present

## 2023-10-08 ENCOUNTER — Encounter (HOSPITAL_BASED_OUTPATIENT_CLINIC_OR_DEPARTMENT_OTHER): Admitting: Internal Medicine

## 2023-10-08 DIAGNOSIS — T8131XA Disruption of external operation (surgical) wound, not elsewhere classified, initial encounter: Secondary | ICD-10-CM | POA: Diagnosis not present

## 2023-10-08 DIAGNOSIS — L97822 Non-pressure chronic ulcer of other part of left lower leg with fat layer exposed: Secondary | ICD-10-CM | POA: Diagnosis not present

## 2023-10-08 DIAGNOSIS — T81328A Disruption or dehiscence of closure of other specified internal operation (surgical) wound, initial encounter: Secondary | ICD-10-CM | POA: Diagnosis not present

## 2023-10-14 ENCOUNTER — Encounter: Payer: Self-pay | Admitting: Internal Medicine

## 2023-10-15 ENCOUNTER — Encounter (HOSPITAL_BASED_OUTPATIENT_CLINIC_OR_DEPARTMENT_OTHER): Admitting: General Surgery

## 2023-10-15 DIAGNOSIS — T8131XA Disruption of external operation (surgical) wound, not elsewhere classified, initial encounter: Secondary | ICD-10-CM | POA: Diagnosis not present

## 2023-10-15 DIAGNOSIS — L97822 Non-pressure chronic ulcer of other part of left lower leg with fat layer exposed: Secondary | ICD-10-CM | POA: Diagnosis not present

## 2023-10-15 DIAGNOSIS — T81328A Disruption or dehiscence of closure of other specified internal operation (surgical) wound, initial encounter: Secondary | ICD-10-CM | POA: Diagnosis not present

## 2023-10-16 DIAGNOSIS — F439 Reaction to severe stress, unspecified: Secondary | ICD-10-CM | POA: Diagnosis not present

## 2023-10-16 DIAGNOSIS — M542 Cervicalgia: Secondary | ICD-10-CM | POA: Diagnosis not present

## 2023-10-16 DIAGNOSIS — I10 Essential (primary) hypertension: Secondary | ICD-10-CM | POA: Diagnosis not present

## 2023-10-16 DIAGNOSIS — R519 Headache, unspecified: Secondary | ICD-10-CM | POA: Diagnosis not present

## 2023-10-22 ENCOUNTER — Encounter (HOSPITAL_BASED_OUTPATIENT_CLINIC_OR_DEPARTMENT_OTHER): Admitting: General Surgery

## 2023-10-22 DIAGNOSIS — L97822 Non-pressure chronic ulcer of other part of left lower leg with fat layer exposed: Secondary | ICD-10-CM | POA: Diagnosis not present

## 2023-10-22 DIAGNOSIS — T8131XA Disruption of external operation (surgical) wound, not elsewhere classified, initial encounter: Secondary | ICD-10-CM | POA: Diagnosis not present

## 2023-10-22 DIAGNOSIS — T8189XA Other complications of procedures, not elsewhere classified, initial encounter: Secondary | ICD-10-CM | POA: Diagnosis not present

## 2023-10-29 ENCOUNTER — Encounter (HOSPITAL_BASED_OUTPATIENT_CLINIC_OR_DEPARTMENT_OTHER): Attending: General Surgery | Admitting: General Surgery

## 2023-10-29 DIAGNOSIS — T8131XA Disruption of external operation (surgical) wound, not elsewhere classified, initial encounter: Secondary | ICD-10-CM | POA: Diagnosis not present

## 2023-10-29 DIAGNOSIS — T8131XS Disruption of external operation (surgical) wound, not elsewhere classified, sequela: Secondary | ICD-10-CM | POA: Insufficient documentation

## 2023-10-29 DIAGNOSIS — L97822 Non-pressure chronic ulcer of other part of left lower leg with fat layer exposed: Secondary | ICD-10-CM | POA: Diagnosis not present

## 2023-10-29 DIAGNOSIS — B965 Pseudomonas (aeruginosa) (mallei) (pseudomallei) as the cause of diseases classified elsewhere: Secondary | ICD-10-CM | POA: Diagnosis not present

## 2023-10-29 NOTE — Addendum Note (Signed)
 Addended by: Elease Etienne A on: 10/29/2023 11:00 AM   Modules accepted: Orders

## 2023-10-29 NOTE — Progress Notes (Signed)
 Remote pacemaker transmission.

## 2023-11-03 ENCOUNTER — Encounter (HOSPITAL_BASED_OUTPATIENT_CLINIC_OR_DEPARTMENT_OTHER): Admitting: General Surgery

## 2023-11-03 DIAGNOSIS — T8131XS Disruption of external operation (surgical) wound, not elsewhere classified, sequela: Secondary | ICD-10-CM | POA: Diagnosis not present

## 2023-11-03 DIAGNOSIS — L97822 Non-pressure chronic ulcer of other part of left lower leg with fat layer exposed: Secondary | ICD-10-CM | POA: Diagnosis not present

## 2023-11-03 DIAGNOSIS — T8131XA Disruption of external operation (surgical) wound, not elsewhere classified, initial encounter: Secondary | ICD-10-CM | POA: Diagnosis not present

## 2023-11-05 ENCOUNTER — Ambulatory Visit (HOSPITAL_BASED_OUTPATIENT_CLINIC_OR_DEPARTMENT_OTHER): Admitting: General Surgery

## 2023-11-12 ENCOUNTER — Encounter (HOSPITAL_BASED_OUTPATIENT_CLINIC_OR_DEPARTMENT_OTHER): Admitting: General Surgery

## 2023-11-12 DIAGNOSIS — T8131XS Disruption of external operation (surgical) wound, not elsewhere classified, sequela: Secondary | ICD-10-CM | POA: Diagnosis not present

## 2023-11-12 DIAGNOSIS — H402234 Chronic angle-closure glaucoma, bilateral, indeterminate stage: Secondary | ICD-10-CM | POA: Diagnosis not present

## 2023-11-12 DIAGNOSIS — T8131XA Disruption of external operation (surgical) wound, not elsewhere classified, initial encounter: Secondary | ICD-10-CM | POA: Diagnosis not present

## 2023-11-12 DIAGNOSIS — H43812 Vitreous degeneration, left eye: Secondary | ICD-10-CM | POA: Diagnosis not present

## 2023-11-12 DIAGNOSIS — L97822 Non-pressure chronic ulcer of other part of left lower leg with fat layer exposed: Secondary | ICD-10-CM | POA: Diagnosis not present

## 2023-11-12 DIAGNOSIS — H2513 Age-related nuclear cataract, bilateral: Secondary | ICD-10-CM | POA: Diagnosis not present

## 2023-11-13 ENCOUNTER — Ambulatory Visit (HOSPITAL_BASED_OUTPATIENT_CLINIC_OR_DEPARTMENT_OTHER): Admitting: General Surgery

## 2023-11-17 DIAGNOSIS — R609 Edema, unspecified: Secondary | ICD-10-CM | POA: Diagnosis not present

## 2023-11-19 ENCOUNTER — Encounter (HOSPITAL_BASED_OUTPATIENT_CLINIC_OR_DEPARTMENT_OTHER): Admitting: General Surgery

## 2023-11-19 DIAGNOSIS — T8131XS Disruption of external operation (surgical) wound, not elsewhere classified, sequela: Secondary | ICD-10-CM | POA: Diagnosis not present

## 2023-11-19 DIAGNOSIS — L97822 Non-pressure chronic ulcer of other part of left lower leg with fat layer exposed: Secondary | ICD-10-CM | POA: Diagnosis not present

## 2023-11-19 DIAGNOSIS — T8131XA Disruption of external operation (surgical) wound, not elsewhere classified, initial encounter: Secondary | ICD-10-CM | POA: Diagnosis not present

## 2023-11-24 ENCOUNTER — Ambulatory Visit: Attending: Pulmonary Disease | Admitting: Pulmonary Disease

## 2023-11-24 ENCOUNTER — Encounter: Payer: Self-pay | Admitting: Pulmonary Disease

## 2023-11-24 ENCOUNTER — Ambulatory Visit: Admitting: Internal Medicine

## 2023-11-24 VITALS — BP 140/60 | HR 71 | Ht 68.0 in | Wt 203.0 lb

## 2023-11-24 DIAGNOSIS — I442 Atrioventricular block, complete: Secondary | ICD-10-CM

## 2023-11-24 DIAGNOSIS — I495 Sick sinus syndrome: Secondary | ICD-10-CM

## 2023-11-24 DIAGNOSIS — I10 Essential (primary) hypertension: Secondary | ICD-10-CM | POA: Diagnosis not present

## 2023-11-24 DIAGNOSIS — Z950 Presence of cardiac pacemaker: Secondary | ICD-10-CM

## 2023-11-24 LAB — CUP PACEART INCLINIC DEVICE CHECK
Date Time Interrogation Session: 20250428174528
Implantable Lead Connection Status: 753985
Implantable Lead Connection Status: 753985
Implantable Lead Implant Date: 20181126
Implantable Lead Implant Date: 20181126
Implantable Lead Location: 753859
Implantable Lead Location: 753860
Implantable Lead Model: 5076
Implantable Lead Model: 5076
Implantable Pulse Generator Implant Date: 20181126
Pulse Gen Model: 2272
Pulse Gen Serial Number: 8971004

## 2023-11-24 NOTE — Patient Instructions (Signed)
 Medication Instructions:  Your physician recommends that you continue on your current medications as directed. Please refer to the Current Medication list given to you today.  *If you need a refill on your cardiac medications before your next appointment, please call your pharmacy*  Lab Work: None ordered If you have labs (blood work) drawn today and your tests are completely normal, you will receive your results only by: MyChart Message (if you have MyChart) OR A paper copy in the mail If you have any lab test that is abnormal or we need to change your treatment, we will call you to review the results.  Follow-Up: At [facility], you and your health needs are our priority.  As part of our continuing mission to provide you with exceptional heart care, our providers are all part of one team.  This team includes your primary Cardiologist (physician) and Advanced Practice Providers or APPs (Physician Assistants and Nurse Practitioners) who all work together to provide you with the care you need, when you need it.  Your next appointment:   3 months  Provider:   Richardo Chandler, MD only   We recommend signing up for the patient portal called "MyChart".  Sign up information is provided on this After Visit Summary.  MyChart is used to connect with patients for Virtual Visits (Telemedicine).  Patients are able to view lab/test results, encounter notes, upcoming appointments, etc.  Non-urgent messages can be sent to your provider as well.   To learn more about what you can do with MyChart, go to [email or web address].

## 2023-11-24 NOTE — Progress Notes (Signed)
  Electrophysiology Office Note:   Date:  11/24/2023  ID:  Jorge Ross, DOB June 05, 1941, MRN [id number]  Primary Cardiologist: None Primary Heart Failure: None Electrophysiologist: Richardo Chandler, MD       History of Present Illness:   Jorge Ross is a 83 y.o. male with h/o LBBB, SND s/p PPM, syncope, HTN seen today for routine electrophysiology followup.   Since last being seen in our clinic the patient reports doing well overall. He has been under stress with continuing to deal with the death of his wife and family issues. He recently sold his home.  He reports his BP has been up a bit with everything happening and he continues to grieve his wife, Jorge Ross.  He follows with Dr. Joice Nares for PCP.  He denies chest pain, palpitations, dyspnea, PND, orthopnea, nausea, vomiting, dizziness, syncope, edema, weight gain, or early satiety.   Review of systems complete and found to be negative unless listed in HPI.   EP Information / Studies Reviewed:    EKG is ordered today. Personal review as below.  EKG Interpretation Date/Time:  Monday November 24 2023 14:21:12 EDT Ventricular Rate:  71 PR Interval:  232 QRS Duration:  212 QT Interval:  464 QTC Calculation: 504 R Axis:   -82  Text Interpretation: Atrial-sensed ventricular-paced rhythm with prolonged AV conduction Confirmed by Creighton Doffing ([id number]) on 11/24/2023 2:25:09 PM   PPM Interrogation-  reviewed in detail today,  See PACEART report.  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  In order to address the patient's elevated BP: Blood pressure will be monitored at home to determine if medication changes need to be made.           Physical Exam:   VS:  BP (!) 140/60   Pulse 71   Ht 5\' 8"  (1.727 m)   Wt 203  lb (92.1 kg)   BMI 30.87 kg/m    Wt Readings from Last 3 Encounters:  11/24/23 203 lb (92.1 kg)  05/13/23 190 lb (86.2 kg)  04/17/23 190 lb 4 oz (86.3 kg)     GEN: Well nourished, well developed in no acute distress NECK: No JVD; No carotid bruits CARDIAC: Regular rate and rhythm, no murmurs, rubs, gallops RESPIRATORY:  Clear to auscultation without rales, wheezing or rhonchi  ABDOMEN: Soft, non-tender, non-distended EXTREMITIES:  No edema; No deformity. Wearing a boot on LLE due to a recent wound / following in wound clinic   ASSESSMENT AND PLAN:    SND / CHB s/p Abbott PPM  -Normal PPM function -See Pace Art report -No changes today -RV sensitivity increase to 4 given dependent   Hypertension  -BP elevated in clinic but reports situational stressors recently  -encouraged patient to monitor his BP and discuss with Dr. Joice Nares if continues to remain >130 systolic  -reviewed limiting stressors as well    Disposition:   Follow up with Dr. Rodolfo Clan in 3 months (pt requested to visit with Dr. Rodolfo Clan before retirement)  Signed, Creighton Doffing, NP-C, AGACNP-BC [facility] - Electrophysiology  11/24/2023, 5:43 PM

## 2023-11-26 ENCOUNTER — Encounter (HOSPITAL_BASED_OUTPATIENT_CLINIC_OR_DEPARTMENT_OTHER): Admitting: General Surgery

## 2023-11-26 DIAGNOSIS — T8131XA Disruption of external operation (surgical) wound, not elsewhere classified, initial encounter: Secondary | ICD-10-CM | POA: Diagnosis not present

## 2023-11-26 DIAGNOSIS — T8131XS Disruption of external operation (surgical) wound, not elsewhere classified, sequela: Secondary | ICD-10-CM | POA: Diagnosis not present

## 2023-11-26 DIAGNOSIS — L97822 Non-pressure chronic ulcer of other part of left lower leg with fat layer exposed: Secondary | ICD-10-CM | POA: Diagnosis not present

## 2023-12-03 ENCOUNTER — Encounter (HOSPITAL_BASED_OUTPATIENT_CLINIC_OR_DEPARTMENT_OTHER): Attending: General Surgery | Admitting: General Surgery

## 2023-12-03 DIAGNOSIS — T8131XS Disruption of external operation (surgical) wound, not elsewhere classified, sequela: Secondary | ICD-10-CM | POA: Insufficient documentation

## 2023-12-03 DIAGNOSIS — T8131XA Disruption of external operation (surgical) wound, not elsewhere classified, initial encounter: Secondary | ICD-10-CM | POA: Diagnosis not present

## 2023-12-03 DIAGNOSIS — X58XXXS Exposure to other specified factors, sequela: Secondary | ICD-10-CM | POA: Insufficient documentation

## 2023-12-03 DIAGNOSIS — L97822 Non-pressure chronic ulcer of other part of left lower leg with fat layer exposed: Secondary | ICD-10-CM | POA: Diagnosis not present

## 2023-12-11 DIAGNOSIS — E039 Hypothyroidism, unspecified: Secondary | ICD-10-CM | POA: Diagnosis not present

## 2023-12-11 DIAGNOSIS — R5383 Other fatigue: Secondary | ICD-10-CM | POA: Diagnosis not present

## 2023-12-11 DIAGNOSIS — Z23 Encounter for immunization: Secondary | ICD-10-CM | POA: Diagnosis not present

## 2023-12-11 DIAGNOSIS — Z1331 Encounter for screening for depression: Secondary | ICD-10-CM | POA: Diagnosis not present

## 2023-12-11 DIAGNOSIS — Z85528 Personal history of other malignant neoplasm of kidney: Secondary | ICD-10-CM | POA: Diagnosis not present

## 2023-12-11 DIAGNOSIS — N1831 Chronic kidney disease, stage 3a: Secondary | ICD-10-CM | POA: Diagnosis not present

## 2023-12-11 DIAGNOSIS — I10 Essential (primary) hypertension: Secondary | ICD-10-CM | POA: Diagnosis not present

## 2023-12-11 DIAGNOSIS — Z950 Presence of cardiac pacemaker: Secondary | ICD-10-CM | POA: Diagnosis not present

## 2023-12-11 DIAGNOSIS — Z0000 Encounter for general adult medical examination without abnormal findings: Secondary | ICD-10-CM | POA: Diagnosis not present

## 2023-12-12 ENCOUNTER — Encounter (HOSPITAL_BASED_OUTPATIENT_CLINIC_OR_DEPARTMENT_OTHER): Admitting: General Surgery

## 2023-12-12 DIAGNOSIS — T8131XS Disruption of external operation (surgical) wound, not elsewhere classified, sequela: Secondary | ICD-10-CM | POA: Diagnosis not present

## 2023-12-12 DIAGNOSIS — L97822 Non-pressure chronic ulcer of other part of left lower leg with fat layer exposed: Secondary | ICD-10-CM | POA: Diagnosis not present

## 2023-12-12 DIAGNOSIS — T8131XA Disruption of external operation (surgical) wound, not elsewhere classified, initial encounter: Secondary | ICD-10-CM | POA: Diagnosis not present

## 2023-12-15 ENCOUNTER — Ambulatory Visit: Payer: Medicare PPO | Admitting: Podiatry

## 2023-12-15 ENCOUNTER — Encounter: Payer: Self-pay | Admitting: Podiatry

## 2023-12-15 DIAGNOSIS — M79675 Pain in left toe(s): Secondary | ICD-10-CM | POA: Diagnosis not present

## 2023-12-15 DIAGNOSIS — B351 Tinea unguium: Secondary | ICD-10-CM

## 2023-12-15 DIAGNOSIS — M79674 Pain in right toe(s): Secondary | ICD-10-CM

## 2023-12-15 NOTE — Progress Notes (Signed)
 Subjective: Chief Complaint  Patient presents with   RFC    RM#66 RFC    83 year old male presents the office today for concerns of thick, elongated nails he is not able to trim himself.  He does not report any new lesions.  Has been using miracle foot cream and the callus has been doing much better.  He does have a wrap on the left leg is going to wound care.  Takes Lyrica  at night for neuropathy.  Objective: AAO x3, NAD DP/PT pulses palpable bilaterally, CRT less than 3 seconds Decreased with signs wanting monofilament Nails are hypertrophic, dystrophic, brittle, discolored, elongated 10. No surrounding redness or drainage. Tenderness nails 1-5 bilaterally.   There are no open lesions identified bilaterally, although limited as the left lower extremity has a dressing on.  Assessment: Symptomatic onychomycosis, hyperkeratotic lesions, neuropathy  Plan: -All treatment options discussed with the patient including all alternatives, risks, complications.  -Sharply debrided the nails x10 without any complications or bleeding. -Continue Lyrica  for neuropathy.  Charity Conch DPM

## 2023-12-18 ENCOUNTER — Encounter (HOSPITAL_BASED_OUTPATIENT_CLINIC_OR_DEPARTMENT_OTHER): Admitting: General Surgery

## 2023-12-18 DIAGNOSIS — T8131XD Disruption of external operation (surgical) wound, not elsewhere classified, subsequent encounter: Secondary | ICD-10-CM | POA: Diagnosis not present

## 2023-12-18 DIAGNOSIS — T8131XS Disruption of external operation (surgical) wound, not elsewhere classified, sequela: Secondary | ICD-10-CM | POA: Diagnosis not present

## 2023-12-18 DIAGNOSIS — L97822 Non-pressure chronic ulcer of other part of left lower leg with fat layer exposed: Secondary | ICD-10-CM | POA: Diagnosis not present

## 2023-12-19 DIAGNOSIS — H402234 Chronic angle-closure glaucoma, bilateral, indeterminate stage: Secondary | ICD-10-CM | POA: Diagnosis not present

## 2023-12-23 ENCOUNTER — Ambulatory Visit (INDEPENDENT_AMBULATORY_CARE_PROVIDER_SITE_OTHER): Payer: Medicare PPO

## 2023-12-23 DIAGNOSIS — I442 Atrioventricular block, complete: Secondary | ICD-10-CM | POA: Diagnosis not present

## 2023-12-24 ENCOUNTER — Ambulatory Visit: Payer: Self-pay | Admitting: Cardiology

## 2023-12-24 LAB — CUP PACEART REMOTE DEVICE CHECK
Battery Remaining Longevity: 32 mo
Battery Remaining Percentage: 30 %
Battery Voltage: 2.93 V
Brady Statistic AP VP Percent: 68 %
Brady Statistic AP VS Percent: 1 %
Brady Statistic AS VP Percent: 32 %
Brady Statistic AS VS Percent: 1 %
Brady Statistic RA Percent Paced: 68 %
Brady Statistic RV Percent Paced: 99 %
Date Time Interrogation Session: 20250527035127
Implantable Lead Connection Status: 753985
Implantable Lead Connection Status: 753985
Implantable Lead Implant Date: 20181126
Implantable Lead Implant Date: 20181126
Implantable Lead Location: 753859
Implantable Lead Location: 753860
Implantable Lead Model: 5076
Implantable Lead Model: 5076
Implantable Pulse Generator Implant Date: 20181126
Lead Channel Impedance Value: 410 Ohm
Lead Channel Impedance Value: 450 Ohm
Lead Channel Pacing Threshold Amplitude: 0.5 V
Lead Channel Pacing Threshold Amplitude: 0.875 V
Lead Channel Pacing Threshold Pulse Width: 0.5 ms
Lead Channel Pacing Threshold Pulse Width: 0.5 ms
Lead Channel Sensing Intrinsic Amplitude: 3.3 mV
Lead Channel Sensing Intrinsic Amplitude: 3.6 mV
Lead Channel Setting Pacing Amplitude: 1.125
Lead Channel Setting Pacing Amplitude: 2 V
Lead Channel Setting Pacing Pulse Width: 0.5 ms
Lead Channel Setting Sensing Sensitivity: 4 mV
Pulse Gen Model: 2272
Pulse Gen Serial Number: 8971004

## 2023-12-26 ENCOUNTER — Other Ambulatory Visit: Payer: Self-pay | Admitting: Podiatry

## 2024-01-01 ENCOUNTER — Other Ambulatory Visit: Payer: Self-pay | Admitting: Podiatry

## 2024-01-01 ENCOUNTER — Telehealth: Payer: Self-pay | Admitting: Podiatry

## 2024-01-01 DIAGNOSIS — R600 Localized edema: Secondary | ICD-10-CM | POA: Diagnosis not present

## 2024-01-01 DIAGNOSIS — I872 Venous insufficiency (chronic) (peripheral): Secondary | ICD-10-CM | POA: Diagnosis not present

## 2024-01-01 NOTE — Progress Notes (Signed)
errir 

## 2024-01-01 NOTE — Telephone Encounter (Signed)
 Patient needs refill for pregabalin  preferred pharmacy is [facility] [phone].

## 2024-01-02 ENCOUNTER — Ambulatory Visit (HOSPITAL_COMMUNITY)
Admission: RE | Admit: 2024-01-02 | Discharge: 2024-01-02 | Disposition: A | Discharge status: Home / Self Care | Source: Ambulatory Visit | Attending: Vascular Surgery | Admitting: Vascular Surgery

## 2024-01-02 ENCOUNTER — Other Ambulatory Visit (HOSPITAL_COMMUNITY): Payer: Self-pay | Admitting: Internal Medicine

## 2024-01-02 DIAGNOSIS — M79662 Pain in left lower leg: Secondary | ICD-10-CM | POA: Diagnosis not present

## 2024-01-02 DIAGNOSIS — M7989 Other specified soft tissue disorders: Secondary | ICD-10-CM | POA: Diagnosis not present

## 2024-01-05 DIAGNOSIS — H402234 Chronic angle-closure glaucoma, bilateral, indeterminate stage: Secondary | ICD-10-CM | POA: Diagnosis not present

## 2024-01-05 DIAGNOSIS — H43812 Vitreous degeneration, left eye: Secondary | ICD-10-CM | POA: Diagnosis not present

## 2024-01-05 DIAGNOSIS — H2513 Age-related nuclear cataract, bilateral: Secondary | ICD-10-CM | POA: Diagnosis not present

## 2024-01-06 NOTE — Telephone Encounter (Signed)
 Called several times to touch base in regards to refill no answer.

## 2024-01-13 DIAGNOSIS — I872 Venous insufficiency (chronic) (peripheral): Secondary | ICD-10-CM | POA: Diagnosis not present

## 2024-01-19 ENCOUNTER — Encounter (HOSPITAL_BASED_OUTPATIENT_CLINIC_OR_DEPARTMENT_OTHER): Attending: General Surgery | Admitting: General Surgery

## 2024-01-19 ENCOUNTER — Ambulatory Visit (HOSPITAL_BASED_OUTPATIENT_CLINIC_OR_DEPARTMENT_OTHER): Admitting: General Surgery

## 2024-01-19 DIAGNOSIS — L97822 Non-pressure chronic ulcer of other part of left lower leg with fat layer exposed: Secondary | ICD-10-CM | POA: Insufficient documentation

## 2024-01-19 DIAGNOSIS — T8131XS Disruption of external operation (surgical) wound, not elsewhere classified, sequela: Secondary | ICD-10-CM | POA: Diagnosis not present

## 2024-01-19 DIAGNOSIS — T8131XA Disruption of external operation (surgical) wound, not elsewhere classified, initial encounter: Secondary | ICD-10-CM | POA: Diagnosis not present

## 2024-01-19 DIAGNOSIS — X58XXXS Exposure to other specified factors, sequela: Secondary | ICD-10-CM | POA: Insufficient documentation

## 2024-01-26 ENCOUNTER — Encounter (HOSPITAL_BASED_OUTPATIENT_CLINIC_OR_DEPARTMENT_OTHER): Admitting: General Surgery

## 2024-01-26 DIAGNOSIS — L97822 Non-pressure chronic ulcer of other part of left lower leg with fat layer exposed: Secondary | ICD-10-CM | POA: Diagnosis not present

## 2024-01-26 DIAGNOSIS — T8131XS Disruption of external operation (surgical) wound, not elsewhere classified, sequela: Secondary | ICD-10-CM | POA: Diagnosis not present

## 2024-01-26 DIAGNOSIS — T8131XA Disruption of external operation (surgical) wound, not elsewhere classified, initial encounter: Secondary | ICD-10-CM | POA: Diagnosis not present

## 2024-02-02 ENCOUNTER — Encounter (HOSPITAL_BASED_OUTPATIENT_CLINIC_OR_DEPARTMENT_OTHER): Attending: General Surgery | Admitting: General Surgery

## 2024-02-02 DIAGNOSIS — Z09 Encounter for follow-up examination after completed treatment for conditions other than malignant neoplasm: Secondary | ICD-10-CM | POA: Diagnosis not present

## 2024-02-02 DIAGNOSIS — Y838 Other surgical procedures as the cause of abnormal reaction of the patient, or of later complication, without mention of misadventure at the time of the procedure: Secondary | ICD-10-CM | POA: Diagnosis not present

## 2024-02-02 DIAGNOSIS — T8131XA Disruption of external operation (surgical) wound, not elsewhere classified, initial encounter: Secondary | ICD-10-CM | POA: Insufficient documentation

## 2024-02-02 DIAGNOSIS — T8131XD Disruption of external operation (surgical) wound, not elsewhere classified, subsequent encounter: Secondary | ICD-10-CM | POA: Diagnosis not present

## 2024-02-02 DIAGNOSIS — L97822 Non-pressure chronic ulcer of other part of left lower leg with fat layer exposed: Secondary | ICD-10-CM | POA: Diagnosis not present

## 2024-02-02 DIAGNOSIS — I872 Venous insufficiency (chronic) (peripheral): Secondary | ICD-10-CM | POA: Diagnosis not present

## 2024-02-09 NOTE — Progress Notes (Signed)
 Remote pacemaker transmission.

## 2024-02-09 NOTE — Addendum Note (Signed)
 Addended by: TAWNI DRILLING D on: 02/09/2024 10:24 AM   Modules accepted: Orders

## 2024-02-10 DIAGNOSIS — H5203 Hypermetropia, bilateral: Secondary | ICD-10-CM | POA: Diagnosis not present

## 2024-02-10 DIAGNOSIS — H43812 Vitreous degeneration, left eye: Secondary | ICD-10-CM | POA: Diagnosis not present

## 2024-02-10 DIAGNOSIS — H402234 Chronic angle-closure glaucoma, bilateral, indeterminate stage: Secondary | ICD-10-CM | POA: Diagnosis not present

## 2024-02-10 DIAGNOSIS — H2513 Age-related nuclear cataract, bilateral: Secondary | ICD-10-CM | POA: Diagnosis not present

## 2024-02-17 ENCOUNTER — Ambulatory Visit: Admitting: Internal Medicine

## 2024-02-18 DIAGNOSIS — H2513 Age-related nuclear cataract, bilateral: Secondary | ICD-10-CM | POA: Diagnosis not present

## 2024-02-18 DIAGNOSIS — H402234 Chronic angle-closure glaucoma, bilateral, indeterminate stage: Secondary | ICD-10-CM | POA: Diagnosis not present

## 2024-02-18 DIAGNOSIS — H5203 Hypermetropia, bilateral: Secondary | ICD-10-CM | POA: Diagnosis not present

## 2024-02-18 DIAGNOSIS — H43812 Vitreous degeneration, left eye: Secondary | ICD-10-CM | POA: Diagnosis not present

## 2024-02-29 ENCOUNTER — Emergency Department (HOSPITAL_COMMUNITY)

## 2024-02-29 ENCOUNTER — Emergency Department (HOSPITAL_COMMUNITY)
Admission: EM | Admit: 2024-02-29 | Discharge: 2024-02-29 | Disposition: A | Discharge status: Home / Self Care | Attending: Emergency Medicine | Admitting: Emergency Medicine

## 2024-02-29 ENCOUNTER — Other Ambulatory Visit: Payer: Self-pay

## 2024-02-29 ENCOUNTER — Encounter (HOSPITAL_COMMUNITY): Payer: Self-pay | Admitting: Pharmacy Technician

## 2024-02-29 DIAGNOSIS — I10 Essential (primary) hypertension: Secondary | ICD-10-CM | POA: Insufficient documentation

## 2024-02-29 DIAGNOSIS — W010XXA Fall on same level from slipping, tripping and stumbling without subsequent striking against object, initial encounter: Secondary | ICD-10-CM | POA: Insufficient documentation

## 2024-02-29 DIAGNOSIS — Z85038 Personal history of other malignant neoplasm of large intestine: Secondary | ICD-10-CM | POA: Insufficient documentation

## 2024-02-29 DIAGNOSIS — Z7982 Long term (current) use of aspirin: Secondary | ICD-10-CM | POA: Diagnosis not present

## 2024-02-29 DIAGNOSIS — M25562 Pain in left knee: Secondary | ICD-10-CM | POA: Insufficient documentation

## 2024-02-29 DIAGNOSIS — S2232XA Fracture of one rib, left side, initial encounter for closed fracture: Secondary | ICD-10-CM | POA: Diagnosis not present

## 2024-02-29 DIAGNOSIS — S2231XA Fracture of one rib, right side, initial encounter for closed fracture: Secondary | ICD-10-CM | POA: Insufficient documentation

## 2024-02-29 DIAGNOSIS — S2241XA Multiple fractures of ribs, right side, initial encounter for closed fracture: Secondary | ICD-10-CM | POA: Diagnosis not present

## 2024-02-29 DIAGNOSIS — Z79899 Other long term (current) drug therapy: Secondary | ICD-10-CM | POA: Diagnosis not present

## 2024-02-29 DIAGNOSIS — R0789 Other chest pain: Secondary | ICD-10-CM | POA: Diagnosis not present

## 2024-02-29 DIAGNOSIS — I700 Atherosclerosis of aorta: Secondary | ICD-10-CM | POA: Diagnosis not present

## 2024-02-29 DIAGNOSIS — R0781 Pleurodynia: Secondary | ICD-10-CM | POA: Diagnosis present

## 2024-02-29 DIAGNOSIS — W19XXXA Unspecified fall, initial encounter: Secondary | ICD-10-CM

## 2024-02-29 DIAGNOSIS — Z950 Presence of cardiac pacemaker: Secondary | ICD-10-CM | POA: Diagnosis not present

## 2024-02-29 DIAGNOSIS — G8911 Acute pain due to trauma: Secondary | ICD-10-CM | POA: Diagnosis not present

## 2024-02-29 DIAGNOSIS — Y9301 Activity, walking, marching and hiking: Secondary | ICD-10-CM | POA: Diagnosis not present

## 2024-02-29 DIAGNOSIS — M7652 Patellar tendinitis, left knee: Secondary | ICD-10-CM | POA: Diagnosis not present

## 2024-02-29 MED ORDER — ACETAMINOPHEN 325 MG PO TABS
650.0000 mg | ORAL_TABLET | Freq: Once | ORAL | Status: AC
  Administered 2024-02-29: 650 mg via ORAL
  Filled 2024-02-29: qty 2

## 2024-02-29 MED ORDER — LIDOCAINE 5 % EX PTCH: 1.0000 | MEDICATED_PATCH | CUTANEOUS | 0 refills | Status: AC

## 2024-02-29 MED ORDER — LIDOCAINE 5 % EX PTCH
1.0000 | MEDICATED_PATCH | CUTANEOUS | Status: DC
  Administered 2024-02-29: 1 via TRANSDERMAL
  Filled 2024-02-29: qty 1

## 2024-02-29 MED ORDER — IBUPROFEN 800 MG PO TABS: 800.0000 mg | ORAL_TABLET | Freq: Three times a day (TID) | ORAL | 0 refills | Status: AC

## 2024-02-29 NOTE — ED Notes (Signed)
 Pt instructed on use of incentive spirometer, return demonstration provided by pt.

## 2024-02-29 NOTE — ED Provider Notes (Signed)
 [place] EMERGENCY DEPARTMENT AT [facility] Provider Note   CSN: [id number] Arrival date & time: 02/29/24  9291     Patient presents with: Jorge Ross is a 83 y.o. male.   83 y.o male with a PMH of HTN, [place] presents to the ED with a chief complaint of mechanical fall x last night.  Patient reports he was try to help his neighbor last night, when suddenly he was walking to the gait and turned too quickly reports tripping and falling landing on his left knee and trying to brace his fall with his hands.  He is not sure if the right side of his body struck the pavement, he is complaining of pain along the right ribs, worsened with any type of palpation along with inhalation.  He has not taken any medication for improvement in symptoms.  He does have a small abrasion to the left knee. He did not strike his head, no LOC. No blood thinners.   The history is provided by the patient.  Fall Pertinent negatives include no chest pain, no abdominal pain and no shortness of breath.       Prior to Admission medications   Medication Sig Start Date End Date Taking? Authorizing Provider  ibuprofen  (ADVIL ) 800 MG tablet Take 1 tablet (800 mg total) by mouth 3 (three) times daily for 5 days. 02/29/24 03/05/24 Yes Avaline Stillson, PA-C  lidocaine  (LIDODERM ) 5 % Place 1 patch onto the skin daily for 15 days. Remove & Discard patch within 12 hours or as directed by MD 02/29/24 03/15/24 Yes Castella Lerner, PA-C  acetaminophen  (TYLENOL ) 500 MG tablet Take 1,000 mg by mouth every 6 (six) hours as needed for moderate pain.    [provider]  aspirin  81 MG tablet Take 81 mg by mouth daily.    [provider]  diclofenac Sodium (VOLTAREN) 1 % GEL Apply 2 g topically as needed. 12/06/21   [provider]  finasteride (PROSCAR) 5 MG tablet Take 5 mg by mouth daily.    [provider]  furosemide (LASIX) 20 MG tablet Take 20 mg by mouth every other day. 02/25/23    [provider]  irbesartan -hydrochlorothiazide  (AVALIDE) 150-12.5 MG per tablet Take 1 tablet by mouth every morning. 01/25/14   [provider]  Multiple Vitamin (MULTIVITAMIN) capsule Take 1 capsule by mouth daily.      [provider]  polyvinyl alcohol  (LIQUIFILM TEARS) 1.4 % ophthalmic solution Place 1 drop into both eyes as needed for dry eyes.    [provider]  pregabalin  (LYRICA ) 50 MG capsule TAKE 1 CAPSULE(50 MG) BY MOUTH DAILY 12/29/23   Gershon Donnice SAUNDERS, DPM  tamsulosin (FLOMAX) 0.4 MG CAPS capsule Take 0.4 mg by mouth daily. 05/28/22   [provider]  triamcinolone  cream (KENALOG) 0.1 % Apply topically 2 (two) times daily as needed. 06/12/23   [provider]  vitamin E  400 UNIT capsule Take 400 Units by mouth daily.     [provider]    Allergies: Oxycodone , Tizanidine hcl, and Codeine    Review of Systems  Constitutional:  Negative for chills and fever.  Respiratory:  Negative for shortness of breath.   Cardiovascular:  Negative for chest pain.  Gastrointestinal:  Negative for abdominal pain, nausea and vomiting.  Musculoskeletal:  Positive for arthralgias and myalgias.  All other systems reviewed and are negative.   Updated Vital Signs BP (!) 157/76   Pulse 62  Temp (!) 97.5 F (36.4 C) (Oral)   Resp 16   SpO2 100%   Physical Exam Vitals and nursing note reviewed.  Constitutional:      Appearance: Normal appearance.  HENT:     Head: Normocephalic and atraumatic.     Comments: No signs of abrasion or laceration noted.     Mouth/Throat:     Mouth: Mucous membranes are moist.  Cardiovascular:     Rate and Rhythm: Normal rate.  Pulmonary:     Effort: Pulmonary effort is normal.     Comments: Poor inspiration with inhalation.  Abdominal:     General: Abdomen is flat.     Palpations: Abdomen is soft.     Tenderness: There is no abdominal tenderness.  Musculoskeletal:     Cervical back:  Normal range of motion and neck supple.  Skin:    General: Skin is warm and dry.  Neurological:     Mental Status: He is alert and oriented to person, place, and time.     Comments: No facial asymmetry Moves upper and lower extremities No weakness       (all labs ordered are listed, but only abnormal results are displayed) Labs Reviewed - No data to display  EKG: EKG Interpretation Date/Time:  Sunday February 29 2024 07:26:56 EDT Ventricular Rate:  61 PR Interval:  196 QRS Duration:  202 QT Interval:  474 QTC Calculation: 477 R Axis:   -80  Text Interpretation: AV dual-paced rhythm Abnormal ECG When compared with ECG of 24-Nov-2023 14:21, PREVIOUS ECG IS PRESENT No significant change since last tracing Confirmed by Dasie Faden ([id number]) on 02/29/2024 11:25:47 AM  Radiology: CT Chest Wo Contrast Result Date: 02/29/2024 CLINICAL DATA:  Suspected rib fracture post trauma. History of colon and renal cell cancer. EXAM: CT CHEST WITHOUT CONTRAST TECHNIQUE: Multidetector CT imaging of the chest was performed following the standard protocol without IV contrast. RADIATION DOSE REDUCTION: This exam was performed according to the departmental dose-optimization program which includes automated exposure control, adjustment of the mA and/or kV according to patient size and/or use of iterative reconstruction technique. COMPARISON:  01/05/2019 FINDINGS: Cardiovascular: Borderline cardiomegaly with dual lead left-sided cardiac pacemaker present. Suggestion of subtle calcified plaque over the left anterior descending and right coronary arteries. Thoracic aorta is normal in caliber. 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 Minimal calcified plaque over the descending thoracic aorta. Remaining vascular structures are unremarkable. Mediastinum/Nodes: No significant mediastinal or hilar adenopathy. Remaining mediastinal structures are unremarkable. Lungs/Pleura: Lungs are  adequately inflated without acute airspace consolidation or effusion. No pneumothorax. Airways are normal. Upper Abdomen: Partially visualized left renal cyst without significant change. No acute findings. Musculoskeletal: Minimally displaced anterolateral acute fracture right fifth rib. Partially visualized anterior fusion hardware over the cervical spine. IMPRESSION: 1. Minimally displaced anterolateral right fifth rib fracture. No pneumothorax. 2. Borderline cardiomegaly with suggestion of subtle calcified plaque over the left anterior descending and right coronary arteries. 3. Partially visualized left renal cyst without significant change. 4. Aortic atherosclerosis. Aortic Atherosclerosis (ICD10-I70.0). Electronically Signed   By: Toribio Agreste M.D.   On: 02/29/2024 11:32   DG Ribs Unilateral W/Chest Right Result Date: 02/29/2024 EXAM: 3 VIEW(S) XRAY OF THE RIGHT RIBS AND CHEST 02/29/2024 08:33:00 AM COMPARISON: 06/24/2017 CLINICAL HISTORY: Fall. Reason for exam: fall/ pain. FINDINGS: BONES: There is an acute fracture involving the anterior aspect of the right fifth rib. LUNGS AND PLEURA: No consolidation or pulmonary edema. No pleural effusion or pneumothorax.  HEART AND MEDIASTINUM: No acute abnormality of the cardiac and mediastinal silhouettes. Left chest wall pacer device is again noted with leads in the right atrial appendage and right ventricle. Anterior side plate and screw device noted within the cervical spine. IMPRESSION: 1. Acute fracture involving the anterior aspect of the right fifth rib. Electronically signed by: Waddell Calk MD 02/29/2024 09:24 AM EDT RP Workstation: [id number]   DG Knee 2 Views Left Result Date: 02/29/2024 EXAM: 1 or 2 VIEW(S) XRAY OF THE LEFT KNEE 02/29/2024 08:33:00 AM COMPARISON: None available. CLINICAL HISTORY: Fall. Reason for exam: fall/ pain. FINDINGS: BONES AND JOINTS: No acute fracture. No focal osseous lesion. No joint dislocation. No significant joint effusion.  No significant degenerative changes. There is some calcification/ossification of the quadriceps tendon at its insertion site on the patella. There is mild enthesophyte formation present along the inferior patellar surface. SOFT TISSUES: There is mild prepatellar soft tissue swelling. IMPRESSION: 1. Mild prepatellar soft tissue swelling, likely related to the reported fall. 2. Calcification/ossification of the quadriceps tendon at its insertion site on the patella and mild enthesophyte formation along the inferior patellar surface. Electronically signed by: evalene coho 02/29/2024 09:22 AM EDT RP Workstation: [id number]     Procedures   Medications Ordered in the ED  lidocaine  (LIDODERM ) 5 % 1 patch (1 patch Transdermal Patch Applied 02/29/24 1023)  acetaminophen  (TYLENOL ) tablet 650 mg (650 mg Oral Given 02/29/24 0758)                                    Medical Decision Making Amount and/or Complexity of Data Reviewed Radiology: ordered.  Risk OTC drugs. Prescription drug management.    Patient presented to the ED status post mechanical fall which occurred yesterday while he trying to quit trying to go back into his home.  He reports falling onto the concrete, is having right sided chest pain worsened with palpation.  He also reports scraping his left knee.  Did not strike his head, did not lose consciousness, currently on no blood thinners.  Exam is benign aside from an abrasion to the left knee.  He does have palpable pain to the chest on the right side but no bruising noted.  X-ray of the left knee without any acute findings.  X-ray of the ribs showed an acute fracture of the fifth rib.  Considering patient's age obtain a CT chest without to further evaluate injury.  IMPRESSION:  1. Minimally displaced anterolateral right fifth rib fracture. No  pneumothorax.  2. Borderline cardiomegaly with suggestion of subtle calcified  plaque over the left anterior descending and right coronary   arteries.  3. Partially visualized left renal cyst without significant change.   I discussed the results with patient at length, he was provided with a copy of her CT scan, also given incentive spirometer.  Will go home on pain control, lidocaine  patches along with close follow-up.  Portions of this note were generated with Scientist, clinical (histocompatibility and immunogenetics). Dictation errors may occur despite best attempts at proofreading.     Final diagnoses:  Fall, initial encounter  Acute pain of left knee  Closed fracture of one rib of right side, initial encounter    ED Discharge Orders          Ordered    lidocaine  (LIDODERM ) 5 %  Every 24 hours        02/29/24 1154    ibuprofen  (ADVIL ) 800  MG tablet  3 times daily        02/29/24 1154               Maeven Mcdougall, PA-C 02/29/24 1156    Dasie Faden, MD 03/01/24 1330

## 2024-02-29 NOTE — ED Triage Notes (Signed)
 Pt here POV after mechanical fall yesterday in the parking lot. Pt states he turned too quick and tripped over a bumper which caused him to fall onto concrete. Pt with R sided chest pain, states pain is so bad it is hard to get his breath. Pt also with L knee pain. Denies LOC, not on blood thinners.

## 2024-02-29 NOTE — Discharge Instructions (Addendum)
 You have a fracture of your right fifth rib, you were given a prescription for medication to help with your pain, please take 1 tablet 3 times a day for the next 7 days with food.  You are also given a prescription for lidocaine  patches, please apply this to your site of pain.  You will need to use your incentive spirometer every day to help with your breathing.  If you experience any fever, cough, signs of pneumonia you will need to come back to the ER.

## 2024-03-04 DIAGNOSIS — M25569 Pain in unspecified knee: Secondary | ICD-10-CM | POA: Diagnosis not present

## 2024-03-04 DIAGNOSIS — S2239XA Fracture of one rib, unspecified side, initial encounter for closed fracture: Secondary | ICD-10-CM | POA: Diagnosis not present

## 2024-03-04 DIAGNOSIS — W19XXXA Unspecified fall, initial encounter: Secondary | ICD-10-CM | POA: Diagnosis not present

## 2024-03-16 ENCOUNTER — Encounter: Payer: Self-pay | Admitting: Podiatry

## 2024-03-16 ENCOUNTER — Ambulatory Visit (INDEPENDENT_AMBULATORY_CARE_PROVIDER_SITE_OTHER): Admitting: Podiatry

## 2024-03-16 DIAGNOSIS — B351 Tinea unguium: Secondary | ICD-10-CM

## 2024-03-16 DIAGNOSIS — M79674 Pain in right toe(s): Secondary | ICD-10-CM | POA: Diagnosis not present

## 2024-03-16 DIAGNOSIS — M79675 Pain in left toe(s): Secondary | ICD-10-CM | POA: Diagnosis not present

## 2024-03-17 NOTE — Progress Notes (Signed)
 Subjective: Chief Complaint  Patient presents with   RFC     RFC . Toenail trim, left distal great toe wound.  0 pain. Using neosporin ointment. Nn diabetic. Pt trippe2 weeks ago and cracked some ribs.      83 year old male presents the office today for concerns of thick, elongated nails he is not able to trim himself.  He was using a cream on his leg that was prescribed by his dermatologist but feels it was not helping he has been using antibiotic ointment.  No drainage or pus.  Recently had an injury but did not injury his lower extremities.   Takes Lyrica  at night for neuropathy.  Objective: AAO x3, NAD DP/PT pulses palpable bilaterally, CRT less than 3 seconds Decreased sensation with Semmes Weinstein monofilament Nails are hypertrophic, dystrophic, brittle, discolored, elongated 10. No surrounding redness or drainage. Tenderness nails 1-5 bilaterally.   Facial abrasion type lesion on the anterior aspect the right leg.  States he has been seeing dermatology for this area.  No drainage or pus or signs of infection. There are no open lesions identified bilaterally, although limited as the left lower extremity has a dressing on.  Assessment: Symptomatic onychomycosis, hyperkeratotic lesions, neuropathy  Plan: -All treatment options discussed with the patient including all alternatives, risks, complications.  -Sharply debrided the nails x10 without any complications or bleeding. -Continue Lyrica  for neuropathy. - Continue antibiotic ointment on the right lower extremity superficial wound.  Monitor for any signs or symptoms of infection.  He is going to follow-up with his dermatologist.  Jorge Ross DPM

## 2024-03-18 DIAGNOSIS — H5203 Hypermetropia, bilateral: Secondary | ICD-10-CM | POA: Diagnosis not present

## 2024-03-18 DIAGNOSIS — H43812 Vitreous degeneration, left eye: Secondary | ICD-10-CM | POA: Diagnosis not present

## 2024-03-18 DIAGNOSIS — H2513 Age-related nuclear cataract, bilateral: Secondary | ICD-10-CM | POA: Diagnosis not present

## 2024-03-18 DIAGNOSIS — H402234 Chronic angle-closure glaucoma, bilateral, indeterminate stage: Secondary | ICD-10-CM | POA: Diagnosis not present

## 2024-03-22 DIAGNOSIS — H2513 Age-related nuclear cataract, bilateral: Secondary | ICD-10-CM | POA: Diagnosis not present

## 2024-03-22 DIAGNOSIS — H43813 Vitreous degeneration, bilateral: Secondary | ICD-10-CM | POA: Diagnosis not present

## 2024-03-22 DIAGNOSIS — H40213 Acute angle-closure glaucoma, bilateral: Secondary | ICD-10-CM | POA: Diagnosis not present

## 2024-03-22 DIAGNOSIS — H34811 Central retinal vein occlusion, right eye, with macular edema: Secondary | ICD-10-CM | POA: Diagnosis not present

## 2024-03-23 ENCOUNTER — Ambulatory Visit (INDEPENDENT_AMBULATORY_CARE_PROVIDER_SITE_OTHER): Payer: Medicare PPO

## 2024-03-23 DIAGNOSIS — I442 Atrioventricular block, complete: Secondary | ICD-10-CM | POA: Diagnosis not present

## 2024-03-24 LAB — CUP PACEART REMOTE DEVICE CHECK
Battery Remaining Longevity: 29 mo
Battery Remaining Percentage: 27 %
Battery Voltage: 2.92 V
Brady Statistic AP VP Percent: 71 %
Brady Statistic AP VS Percent: 1 %
Brady Statistic AS VP Percent: 29 %
Brady Statistic AS VS Percent: 1 %
Brady Statistic RA Percent Paced: 71 %
Brady Statistic RV Percent Paced: 99 %
Date Time Interrogation Session: 20250826020012
Implantable Lead Connection Status: 753985
Implantable Lead Connection Status: 753985
Implantable Lead Implant Date: 20181126
Implantable Lead Implant Date: 20181126
Implantable Lead Location: 753859
Implantable Lead Location: 753860
Implantable Lead Model: 5076
Implantable Lead Model: 5076
Implantable Pulse Generator Implant Date: 20181126
Lead Channel Impedance Value: 430 Ohm
Lead Channel Impedance Value: 450 Ohm
Lead Channel Pacing Threshold Amplitude: 0.5 V
Lead Channel Pacing Threshold Amplitude: 0.75 V
Lead Channel Pacing Threshold Pulse Width: 0.5 ms
Lead Channel Pacing Threshold Pulse Width: 0.5 ms
Lead Channel Sensing Intrinsic Amplitude: 3.1 mV
Lead Channel Sensing Intrinsic Amplitude: 3.3 mV
Lead Channel Setting Pacing Amplitude: 1 V
Lead Channel Setting Pacing Amplitude: 2 V
Lead Channel Setting Pacing Pulse Width: 0.5 ms
Lead Channel Setting Sensing Sensitivity: 4 mV
Pulse Gen Model: 2272
Pulse Gen Serial Number: 8971004

## 2024-03-28 ENCOUNTER — Ambulatory Visit: Payer: Self-pay | Admitting: Cardiology

## 2024-04-01 DIAGNOSIS — H35372 Puckering of macula, left eye: Secondary | ICD-10-CM | POA: Diagnosis not present

## 2024-04-01 DIAGNOSIS — H401134 Primary open-angle glaucoma, bilateral, indeterminate stage: Secondary | ICD-10-CM | POA: Diagnosis not present

## 2024-04-01 DIAGNOSIS — H25813 Combined forms of age-related cataract, bilateral: Secondary | ICD-10-CM | POA: Diagnosis not present

## 2024-04-01 DIAGNOSIS — H34811 Central retinal vein occlusion, right eye, with macular edema: Secondary | ICD-10-CM | POA: Diagnosis not present

## 2024-04-07 ENCOUNTER — Other Ambulatory Visit: Payer: Self-pay | Admitting: Lab

## 2024-04-07 ENCOUNTER — Other Ambulatory Visit: Payer: Self-pay | Admitting: Podiatry

## 2024-04-07 MED ORDER — PREGABALIN 50 MG PO CAPS: ORAL_CAPSULE | ORAL | 0 refills | Status: DC

## 2024-04-13 NOTE — Progress Notes (Signed)
 Remote PPM Transmission

## 2024-04-22 DIAGNOSIS — H40213 Acute angle-closure glaucoma, bilateral: Secondary | ICD-10-CM | POA: Diagnosis not present

## 2024-04-22 DIAGNOSIS — H34811 Central retinal vein occlusion, right eye, with macular edema: Secondary | ICD-10-CM | POA: Diagnosis not present

## 2024-04-22 DIAGNOSIS — H35372 Puckering of macula, left eye: Secondary | ICD-10-CM | POA: Diagnosis not present

## 2024-04-22 DIAGNOSIS — H2513 Age-related nuclear cataract, bilateral: Secondary | ICD-10-CM | POA: Diagnosis not present

## 2024-04-22 DIAGNOSIS — H43813 Vitreous degeneration, bilateral: Secondary | ICD-10-CM | POA: Diagnosis not present

## 2024-04-22 DIAGNOSIS — H3411 Central retinal artery occlusion, right eye: Secondary | ICD-10-CM | POA: Diagnosis not present

## 2024-05-27 DIAGNOSIS — H40213 Acute angle-closure glaucoma, bilateral: Secondary | ICD-10-CM | POA: Diagnosis not present

## 2024-05-27 DIAGNOSIS — H3411 Central retinal artery occlusion, right eye: Secondary | ICD-10-CM | POA: Diagnosis not present

## 2024-05-27 DIAGNOSIS — H43813 Vitreous degeneration, bilateral: Secondary | ICD-10-CM | POA: Diagnosis not present

## 2024-05-27 DIAGNOSIS — H35372 Puckering of macula, left eye: Secondary | ICD-10-CM | POA: Diagnosis not present

## 2024-05-27 DIAGNOSIS — H34811 Central retinal vein occlusion, right eye, with macular edema: Secondary | ICD-10-CM | POA: Diagnosis not present

## 2024-05-27 DIAGNOSIS — H2513 Age-related nuclear cataract, bilateral: Secondary | ICD-10-CM | POA: Diagnosis not present

## 2024-06-03 DIAGNOSIS — I2510 Atherosclerotic heart disease of native coronary artery without angina pectoris: Secondary | ICD-10-CM | POA: Diagnosis not present

## 2024-06-03 DIAGNOSIS — G629 Polyneuropathy, unspecified: Secondary | ICD-10-CM | POA: Diagnosis not present

## 2024-06-03 DIAGNOSIS — Z7982 Long term (current) use of aspirin: Secondary | ICD-10-CM | POA: Diagnosis not present

## 2024-06-03 DIAGNOSIS — H409 Unspecified glaucoma: Secondary | ICD-10-CM | POA: Diagnosis not present

## 2024-06-03 DIAGNOSIS — H269 Unspecified cataract: Secondary | ICD-10-CM | POA: Diagnosis not present

## 2024-06-03 DIAGNOSIS — I872 Venous insufficiency (chronic) (peripheral): Secondary | ICD-10-CM | POA: Diagnosis not present

## 2024-06-03 DIAGNOSIS — I700 Atherosclerosis of aorta: Secondary | ICD-10-CM | POA: Diagnosis not present

## 2024-06-03 DIAGNOSIS — E039 Hypothyroidism, unspecified: Secondary | ICD-10-CM | POA: Diagnosis not present

## 2024-06-03 DIAGNOSIS — I10 Essential (primary) hypertension: Secondary | ICD-10-CM | POA: Diagnosis not present

## 2024-06-16 DIAGNOSIS — I10 Essential (primary) hypertension: Secondary | ICD-10-CM | POA: Diagnosis not present

## 2024-06-16 DIAGNOSIS — Z85528 Personal history of other malignant neoplasm of kidney: Secondary | ICD-10-CM | POA: Diagnosis not present

## 2024-06-16 DIAGNOSIS — N1831 Chronic kidney disease, stage 3a: Secondary | ICD-10-CM | POA: Diagnosis not present

## 2024-06-16 DIAGNOSIS — R946 Abnormal results of thyroid function studies: Secondary | ICD-10-CM | POA: Diagnosis not present

## 2024-06-16 DIAGNOSIS — R7989 Other specified abnormal findings of blood chemistry: Secondary | ICD-10-CM | POA: Diagnosis not present

## 2024-06-17 ENCOUNTER — Ambulatory Visit: Admitting: Podiatry

## 2024-06-22 ENCOUNTER — Ambulatory Visit: Payer: Medicare PPO

## 2024-06-22 DIAGNOSIS — I442 Atrioventricular block, complete: Secondary | ICD-10-CM

## 2024-06-22 LAB — CUP PACEART REMOTE DEVICE CHECK
Battery Remaining Longevity: 26 mo
Battery Remaining Percentage: 24 %
Battery Voltage: 2.9 V
Brady Statistic AP VP Percent: 69 %
Brady Statistic AP VS Percent: 1 %
Brady Statistic AS VP Percent: 31 %
Brady Statistic AS VS Percent: 1 %
Brady Statistic RA Percent Paced: 69 %
Brady Statistic RV Percent Paced: 99 %
Date Time Interrogation Session: 20251125020019
Implantable Lead Connection Status: 753985
Implantable Lead Connection Status: 753985
Implantable Lead Implant Date: 20181126
Implantable Lead Implant Date: 20181126
Implantable Lead Location: 753859
Implantable Lead Location: 753860
Implantable Lead Model: 5076
Implantable Lead Model: 5076
Implantable Pulse Generator Implant Date: 20181126
Lead Channel Impedance Value: 410 Ohm
Lead Channel Impedance Value: 440 Ohm
Lead Channel Pacing Threshold Amplitude: 0.5 V
Lead Channel Pacing Threshold Amplitude: 0.875 V
Lead Channel Pacing Threshold Pulse Width: 0.5 ms
Lead Channel Pacing Threshold Pulse Width: 0.5 ms
Lead Channel Sensing Intrinsic Amplitude: 3.3 mV
Lead Channel Sensing Intrinsic Amplitude: 3.6 mV
Lead Channel Setting Pacing Amplitude: 1.125
Lead Channel Setting Pacing Amplitude: 2 V
Lead Channel Setting Pacing Pulse Width: 0.5 ms
Lead Channel Setting Sensing Sensitivity: 4 mV
Pulse Gen Model: 2272
Pulse Gen Serial Number: 8971004

## 2024-06-23 NOTE — Progress Notes (Signed)
 Remote PPM Transmission

## 2024-06-28 ENCOUNTER — Ambulatory Visit: Payer: Self-pay | Admitting: Cardiology

## 2024-07-01 DIAGNOSIS — H2513 Age-related nuclear cataract, bilateral: Secondary | ICD-10-CM | POA: Diagnosis not present

## 2024-07-01 DIAGNOSIS — H34811 Central retinal vein occlusion, right eye, with macular edema: Secondary | ICD-10-CM | POA: Diagnosis not present

## 2024-07-01 DIAGNOSIS — H40213 Acute angle-closure glaucoma, bilateral: Secondary | ICD-10-CM | POA: Diagnosis not present

## 2024-07-01 DIAGNOSIS — H3411 Central retinal artery occlusion, right eye: Secondary | ICD-10-CM | POA: Diagnosis not present

## 2024-07-01 DIAGNOSIS — H35372 Puckering of macula, left eye: Secondary | ICD-10-CM | POA: Diagnosis not present

## 2024-07-01 DIAGNOSIS — H43813 Vitreous degeneration, bilateral: Secondary | ICD-10-CM | POA: Diagnosis not present

## 2024-07-12 ENCOUNTER — Ambulatory Visit: Admitting: Podiatry

## 2024-07-12 DIAGNOSIS — M21612 Bunion of left foot: Secondary | ICD-10-CM | POA: Diagnosis not present

## 2024-07-12 DIAGNOSIS — Q828 Other specified congenital malformations of skin: Secondary | ICD-10-CM | POA: Diagnosis not present

## 2024-07-12 DIAGNOSIS — G63 Polyneuropathy in diseases classified elsewhere: Secondary | ICD-10-CM | POA: Diagnosis not present

## 2024-07-12 DIAGNOSIS — M79675 Pain in left toe(s): Secondary | ICD-10-CM

## 2024-07-12 DIAGNOSIS — C801 Malignant (primary) neoplasm, unspecified: Secondary | ICD-10-CM | POA: Diagnosis not present

## 2024-07-12 DIAGNOSIS — L97521 Non-pressure chronic ulcer of other part of left foot limited to breakdown of skin: Secondary | ICD-10-CM

## 2024-07-12 DIAGNOSIS — M79674 Pain in right toe(s): Secondary | ICD-10-CM | POA: Diagnosis not present

## 2024-07-12 DIAGNOSIS — B351 Tinea unguium: Secondary | ICD-10-CM

## 2024-07-12 NOTE — Patient Instructions (Signed)
 Pregabalin  Capsules What is this medication? PREGABALIN  (pre GAB a lin) treats nerve pain. It may also be used to prevent and control seizures in people with epilepsy. It works by calming overactive nerves in your body. This medicine may be used for other purposes; ask your health care provider or pharmacist if you have questions. COMMON BRAND NAME(S): Lyrica  What should I tell my care team before I take this medication? They need to know if you have any of these conditions: Have or have had depression Heart failure Kidney disease Lung or breathing disease, such as asthma or COPD Substance use disorder Suicidal thoughts, plans, or attempt An unusual or allergic reaction to pregabalin , other medications, foods, dyes, or preservatives Pregnant or trying to get pregnant Breastfeeding How should I use this medication? Take this medication by mouth with water. Take it as directed on the prescription label at the same time every day. You can take it with or without food. If it upsets your stomach, take it with food. Keep taking it unless your care team tells you to stop. A special MedGuide will be given to you by the pharmacist with each prescription and refill. Be sure to read this information carefully each time. Talk to your care team about the use of this medication in children. While it may be prescribed for children as young as 1 month for selected conditions, precautions do apply. Overdosage: If you think you have taken too much of this medicine contact a poison control center or emergency room at once. NOTE: This medicine is only for you. Do not share this medicine with others. What if I miss a dose? If you miss a dose, take it as soon as you can. If it is almost time for your next dose, take only that dose. Do not take double or extra doses. What may interact with this medication? This medication may interact with the following: Alcohol Antihistamines for allergy, cough, and  cold Certain medications for anxiety or sleep Certain medications for blood pressure, heart disease Certain medications for depression like amitriptyline, fluoxetine, sertraline Certain medications for diabetes, like pioglitazone, rosiglitazone Certain medications for seizures like phenobarbital, primidone General anesthetics like halothane, isoflurane, methoxyflurane, propofol  Medications that relax muscles for surgery Opioid medications for pain Phenothiazines like chlorpromazine, mesoridazine, prochlorperazine, thioridazine This list may not describe all possible interactions. Give your health care provider a list of all the medicines, herbs, non-prescription drugs, or dietary supplements you use. Also tell them if you smoke, drink alcohol, or use illegal drugs. Some items may interact with your medicine. What should I watch for while using this medication? Visit your care team for regular checks on your progress. Tell your care team if your symptoms do not start to get better or if they get worse. Do not suddenly stop taking this medication. You may develop a severe reaction. Your care team will tell you how much medication to take. If your care team wants you to stop the medication, the dose may be slowly lowered over time to avoid any side effects. This medication may affect your coordination, reaction time, or judgment. Do not drive or operate machinery until you know how this medication affects you. Sit up or stand slowly to reduce the risk of dizzy or fainting spells. Drinking alcohol with this medication can increase the risk of these side effects. Taking this medication with other CNS depressants can make you too sleepy. This can make it hard to breathe and stay awake. In some cases, it  can cause coma and death. CNS depressants include opioids, benzodiazepines, muscle relaxants, medications for sleep, and alcohol. Talk to your care team about all the medications, vitamins, and supplements  you take. They can tell you what is safe to take together. Call emergency services right away if you have slow or shallow breathing, feel dizzy or confused, or have trouble staying awake. This medication can cause new or worsening depression and increase the risk of suicidal thoughts and actions in a small number of people. This can happen while you are taking this medication or after stopping it. Talk to your care team right away if you have changes in mood or behavior or thoughts of self-harm or suicide. They can help you. If you take this medication for seizures, wear a medical ID bracelet or chain. Carry a card that describes your condition. List the medications and doses you take on the card. Talk to your care team if you may be pregnant. There are benefits and risks to taking medications during pregnancy. Your care team can help you find the option that works for you. What side effects may I notice from receiving this medication? Side effects that you should report to your care team as soon as possible: Allergic reactions or angioedema--skin rash, itching or hives, swelling of the face, eyes, lips, tongue, arms, or legs, trouble swallowing or breathing Change in vision Muscle injury--unusual weakness or fatigue, muscle pain, dark yellow or brown urine, decrease in amount of urine Thoughts of suicide or self-harm, worsening mood, feelings of depression Trouble breathing Unusual bruising or bleeding Unusual changes in mood or behavior in children after use such as trouble concentrating, hostility, or restlessness Side effects that usually do not require medical attention (report these to your care team if they continue or are bothersome): Blurry vision Dizziness Drowsiness Dry mouth Swelling of the ankles, hands, or feet Weight gain This list may not describe all possible side effects. Call your doctor for medical advice about side effects. You may report side effects to FDA at  1-800-FDA-1088. Where should I keep my medication? Keep out of the reach of children and pets. This medication can be abused. Keep it in a safe place to protect it from theft. Do not share it with anyone. It is only for you. Selling or giving away this medication is dangerous and against the law. Store at [employer] C (77 degrees F). Get rid of any unused medication after the expiration date. This medication may cause harm and death if it is taken by other adults, children, or pets. It is important to get rid of the medication as soon as you no longer need it, or it is expired. You can do this in two ways: Take the medication to a medication take-back program. Check with your pharmacy or law enforcement to find a location. If you cannot return the medication, check the label or package insert to see if the medication should be thrown out in the garbage or flushed down the toilet. If you are not sure, ask your care team. If it is safe to put it in the trash, take the medication out of the container. Mix the medication with cat litter, dirt, coffee grounds, or other unwanted substance. Seal the mixture in a bag or container. Put it in the trash. NOTE: This sheet is a summary. It may not cover all possible information. If you have questions about this medicine, talk to your doctor, pharmacist, or health care provider.  2025 Elsevier/Gold  Standard (2023-12-01 00:00:00)

## 2024-07-12 NOTE — Progress Notes (Signed)
 Subjective: Chief Complaint  Patient presents with   Nail Problem    Nail trim      83 year old male presents the office today for concerns of thick, elongated nails he is not able to trim himself.  He states that he thinks his compression socks may be too tight.  He was keeping a space between the left 1st and 2nd toes but has discontinued this he feels the toes are pressing into each other.  He follows with dermatology for leg issues.  Takes Lyrica  at night for neuropathy.  No significant side effects. Objective: AAO x3, NAD DP/PT pulses palpable bilaterally, CRT less than 3 seconds Decreased sensation with Semmes Weinstein monofilament Nails are hypertrophic, dystrophic, brittle, discolored, elongated 10. No surrounding redness or drainage. Tenderness nails 1-5 bilaterally.   Hyperkeratotic lesions noted submetatarsal bilaterally without any underlying ulceration or signs of infection Superficial skin breakdown noted on the medial aspect of the second toe and lateral aspect of the hallux on the left foot, the toes are rubbing each other.  There is no drainage or pus there is no probing.  There is limited to the breakdown of the skin.  There is no surrounding erythema, ascending cellulitis.  No fluctuation or capitation.  There is no malodor. Bunion, hammertoe deformities are present No pain with calf compression  Assessment: Symptomatic onychomycosis, hyperkeratotic lesions, neuropathy; bunion, hammertoe deformities resulted in superficial skin breakdown left foot  Plan: Symptomatic onychomycosis -Sharply debrided the nails x10 without any complications or bleeding.  Preulcerative calluses -Sharply debrided submetatarsal x 2 without complications or bleeding.  Moisturizer, offloading  Superficial ulcers, left foot - Debrided the callus overlying the area and complications.  There is superficial areas of skin breakdown noted.  Discussed antibody ointment dressing changes daily as  well as offloading.  Continue offloading pad.  Discussed wearing open toed compression sock to avoid pressure.  You are able to visualize skin lines on the toes with compression socks or putting too much pressure on the digits.   -Continue Lyrica  for neuropathy.   Jorge Ross Fees DPM

## 2024-07-27 ENCOUNTER — Telehealth: Payer: Self-pay | Admitting: Lab

## 2024-07-27 ENCOUNTER — Other Ambulatory Visit: Payer: Self-pay | Admitting: Podiatry

## 2024-07-27 MED ORDER — PREGABALIN 50 MG PO CAPS: ORAL_CAPSULE | ORAL | 0 refills | Status: AC

## 2024-07-27 NOTE — Telephone Encounter (Signed)
 Refill request on Pregabalin  50mg  daily #90

## 2024-09-21 ENCOUNTER — Ambulatory Visit: Payer: Medicare PPO

## 2024-09-29 ENCOUNTER — Ambulatory Visit: Admitting: Cardiology

## 2024-10-11 ENCOUNTER — Ambulatory Visit: Admitting: Podiatry

## 2024-12-21 ENCOUNTER — Ambulatory Visit

## 2025-03-22 ENCOUNTER — Ambulatory Visit

## 2025-06-21 ENCOUNTER — Ambulatory Visit

## 2025-09-20 ENCOUNTER — Ambulatory Visit
